# Patient Record
Sex: Male | Born: 1935 | Race: White | Hispanic: No | Marital: Married | Smoking: Former smoker
Health system: Southern US, Community
[De-identification: ages and names within clinical notes are randomized; demographics above are authoritative.]

## PROBLEM LIST (undated history)

## (undated) DIAGNOSIS — D701 Agranulocytosis secondary to cancer chemotherapy: Secondary | ICD-10-CM

## (undated) DIAGNOSIS — I10 Essential (primary) hypertension: Secondary | ICD-10-CM

## (undated) DIAGNOSIS — C3490 Malignant neoplasm of unspecified part of unspecified bronchus or lung: Secondary | ICD-10-CM

## (undated) DIAGNOSIS — C189 Malignant neoplasm of colon, unspecified: Secondary | ICD-10-CM

## (undated) DIAGNOSIS — C719 Malignant neoplasm of brain, unspecified: Secondary | ICD-10-CM

## (undated) DIAGNOSIS — I420 Dilated cardiomyopathy: Secondary | ICD-10-CM

## (undated) DIAGNOSIS — Z9221 Personal history of antineoplastic chemotherapy: Secondary | ICD-10-CM

## (undated) DIAGNOSIS — D649 Anemia, unspecified: Secondary | ICD-10-CM

## (undated) DIAGNOSIS — Z923 Personal history of irradiation: Secondary | ICD-10-CM

## (undated) DIAGNOSIS — C229 Malignant neoplasm of liver, not specified as primary or secondary: Secondary | ICD-10-CM

## (undated) DIAGNOSIS — I340 Nonrheumatic mitral (valve) insufficiency: Secondary | ICD-10-CM

## (undated) DIAGNOSIS — R918 Other nonspecific abnormal finding of lung field: Secondary | ICD-10-CM

## (undated) DIAGNOSIS — R0602 Shortness of breath: Secondary | ICD-10-CM

## (undated) DIAGNOSIS — T451X5A Adverse effect of antineoplastic and immunosuppressive drugs, initial encounter: Principal | ICD-10-CM

## (undated) HISTORY — PX: HERNIA REPAIR: SHX51

## (undated) HISTORY — PX: OTHER SURGICAL HISTORY: SHX169

## (undated) HISTORY — DX: Malignant neoplasm of brain, unspecified: C71.9

## (undated) HISTORY — DX: Agranulocytosis secondary to cancer chemotherapy: D70.1

## (undated) HISTORY — DX: Personal history of irradiation: Z92.3

## (undated) HISTORY — DX: Anemia, unspecified: D64.9

## (undated) HISTORY — DX: Malignant neoplasm of colon, unspecified: C18.9

## (undated) HISTORY — DX: Personal history of antineoplastic chemotherapy: Z92.21

## (undated) HISTORY — DX: Malignant neoplasm of unspecified part of unspecified bronchus or lung: C34.90

## (undated) HISTORY — DX: Adverse effect of antineoplastic and immunosuppressive drugs, initial encounter: T45.1X5A

## (undated) HISTORY — DX: Essential (primary) hypertension: I10

---

## 2010-12-21 ENCOUNTER — Emergency Department (HOSPITAL_COMMUNITY)
Admission: EM | Admit: 2010-12-21 | Discharge: 2010-12-21 | Disposition: A | Discharge status: Home / Self Care | Payer: Medicare Other | Attending: Emergency Medicine | Admitting: Emergency Medicine

## 2010-12-21 ENCOUNTER — Emergency Department (HOSPITAL_COMMUNITY): Payer: Medicare Other

## 2010-12-21 DIAGNOSIS — S61209A Unspecified open wound of unspecified finger without damage to nail, initial encounter: Secondary | ICD-10-CM | POA: Insufficient documentation

## 2010-12-21 DIAGNOSIS — W298XXA Contact with other powered powered hand tools and household machinery, initial encounter: Secondary | ICD-10-CM | POA: Insufficient documentation

## 2010-12-21 DIAGNOSIS — Y92009 Unspecified place in unspecified non-institutional (private) residence as the place of occurrence of the external cause: Secondary | ICD-10-CM | POA: Insufficient documentation

## 2011-01-01 ENCOUNTER — Emergency Department (HOSPITAL_COMMUNITY)
Admission: EM | Admit: 2011-01-01 | Discharge: 2011-01-01 | Disposition: A | Discharge status: Home / Self Care | Payer: Medicare Other | Attending: Emergency Medicine | Admitting: Emergency Medicine

## 2011-01-01 ENCOUNTER — Encounter: Payer: Self-pay | Admitting: *Deleted

## 2011-01-01 DIAGNOSIS — Z4802 Encounter for removal of sutures: Secondary | ICD-10-CM | POA: Insufficient documentation

## 2011-01-01 NOTE — ED Notes (Signed)
Suture removal to index finger 

## 2011-11-02 ENCOUNTER — Encounter (HOSPITAL_COMMUNITY): Payer: Self-pay

## 2011-11-02 ENCOUNTER — Emergency Department (HOSPITAL_COMMUNITY)
Admission: EM | Admit: 2011-11-02 | Discharge: 2011-11-02 | Disposition: A | Discharge status: Home / Self Care | Payer: Medicare Other | Attending: Emergency Medicine | Admitting: Emergency Medicine

## 2011-11-02 ENCOUNTER — Emergency Department (HOSPITAL_COMMUNITY): Payer: Medicare Other

## 2011-11-02 DIAGNOSIS — R0602 Shortness of breath: Secondary | ICD-10-CM | POA: Insufficient documentation

## 2011-11-02 DIAGNOSIS — D649 Anemia, unspecified: Secondary | ICD-10-CM

## 2011-11-02 DIAGNOSIS — R21 Rash and other nonspecific skin eruption: Secondary | ICD-10-CM | POA: Insufficient documentation

## 2011-11-02 DIAGNOSIS — C799 Secondary malignant neoplasm of unspecified site: Secondary | ICD-10-CM

## 2011-11-02 DIAGNOSIS — C801 Malignant (primary) neoplasm, unspecified: Secondary | ICD-10-CM | POA: Insufficient documentation

## 2011-11-02 DIAGNOSIS — R609 Edema, unspecified: Secondary | ICD-10-CM | POA: Insufficient documentation

## 2011-11-02 LAB — BASIC METABOLIC PANEL
BUN: 26 mg/dL — ABNORMAL HIGH (ref 6–23)
CO2: 25 mEq/L (ref 19–32)
Chloride: 95 mEq/L — ABNORMAL LOW (ref 96–112)
Creatinine, Ser: 1 mg/dL (ref 0.50–1.35)
Potassium: 3.9 mEq/L (ref 3.5–5.1)

## 2011-11-02 LAB — CBC
HCT: 29.9 % — ABNORMAL LOW (ref 39.0–52.0)
Hemoglobin: 9 g/dL — ABNORMAL LOW (ref 13.0–17.0)
MCV: 68.1 fL — ABNORMAL LOW (ref 78.0–100.0)
RDW: 19.2 % — ABNORMAL HIGH (ref 11.5–15.5)
WBC: 16.9 10*3/uL — ABNORMAL HIGH (ref 4.0–10.5)

## 2011-11-02 MED ORDER — IOHEXOL 300 MG/ML  SOLN
80.0000 mL | Freq: Once | INTRAMUSCULAR | Status: AC | PRN
  Administered 2011-11-02: 80 mL via INTRAVENOUS

## 2011-11-02 NOTE — ED Provider Notes (Signed)
History     CSN: [id number]  Arrival date & time 11/02/11  1058   First MD Initiated Contact with Patient 11/02/11 1110      Chief Complaint  Patient presents with  . Shortness of Breath    Patient is a 78 y.o. male presenting with shortness of breath. The history is provided by the patient.  Shortness of Breath  The current episode started more than 2 weeks ago (one month ago). The onset was gradual. The problem occurs continuously. The problem has been gradually worsening. The problem is moderate. The symptoms are relieved by rest. The symptoms are aggravated by activity. Associated symptoms include shortness of breath. Pertinent negatives include no chest pain, no fever and no cough.  Pt has also been having decreased activity and weight loss.  He has had some swelling.  Pt was seen by PCP yesterday and found to have increased WBC with nodules noted on the CT.  Pt was told to come to the hospital yesterday for further workup but pt had to do a few things first.  History reviewed. No pertinent past medical history.  Past Surgical History  Procedure Date  . Hernia repair     History reviewed. No pertinent family history.  History  Substance Use Topics  . Smoking status: Never Smoker   . Smokeless tobacco: Not on file  . Alcohol Use: No      Review of Systems  Constitutional: Negative for fever.  Respiratory: Positive for shortness of breath. Negative for cough.   Cardiovascular: Negative for chest pain.  All other systems reviewed and are negative.    Allergies  Review of patient's allergies indicates no known allergies.  Home Medications   Current Outpatient Rx  Name Route Sig Dispense Refill  . CIPROFLOXACIN HCL 500 MG PO TABS Oral Take 500 mg by mouth 2 (two) times daily. Take for 10 days.  First dose 11/01/2011.    Marland Kitchen FUROSEMIDE 20 MG PO TABS Oral Take 20 mg by mouth 2 (two) times daily.    Marland Kitchen OVER THE COUNTER MEDICATION Oral Take 1 tablet by mouth 3 (three)  times daily. "Blood Pressurex"    . OVER THE COUNTER MEDICATION Oral Take 1 tablet by mouth 2 (two) times daily. "Stress-J"      BP 124/68  Pulse 94  Temp(Src) 97.8 F (36.6 C) (Oral)  Resp 17  SpO2 100%  Physical Exam  Nursing note and vitals reviewed. Constitutional: He appears well-developed and well-nourished. No distress.  HENT:  Head: Normocephalic and atraumatic.  Right Ear: External ear normal.  Left Ear: External ear normal.  Eyes: Conjunctivae are normal. Right eye exhibits no discharge. Left eye exhibits no discharge. No scleral icterus.  Neck: Neck supple. No tracheal deviation present.  Cardiovascular: Normal rate, regular rhythm and intact distal pulses.   Pulmonary/Chest: Effort normal and breath sounds normal. No stridor. No respiratory distress. He has no wheezes. He has no rales.  Abdominal: Soft. Bowel sounds are normal. He exhibits no distension. There is no tenderness. There is no rebound and no guarding.  Musculoskeletal: He exhibits edema. He exhibits no tenderness.  Neurological: He is alert. He has normal strength. No sensory deficit. Cranial nerve deficit:  no gross defecits noted. He exhibits normal muscle tone. He displays no seizure activity. Coordination normal.  Skin: Skin is warm and dry. Rash noted.  Psychiatric: He has a normal mood and affect.    ED Course  Procedures (including critical care time)  Rate: 90  Rhythm: normal sinus rhythm  QRS Axis: normal  Intervals: normal  ST/T Wave abnormalities: Inverted T waves  Conduction Disutrbances:none  Narrative Interpretation: Short PR interval, nonspecific intraventricular conduction delay, LVH with repolarization abnormality  Old EKG Reviewed: none available  Labs Reviewed  BASIC METABOLIC PANEL - Abnormal; Notable for the following:    Sodium 133 (*)    Chloride 95 (*)    Glucose, Bld 121 (*)    BUN 26 (*)    GFR calc non Af Amer 71 (*)    GFR calc Af Amer 83 (*)    All other components  within normal limits  CBC - Abnormal; Notable for the following:    WBC 16.9 (*)    Hemoglobin 9.0 (*)    HCT 29.9 (*)    MCV 68.1 (*)    MCH 20.5 (*)    RDW 19.2 (*)    Platelets 546 (*)    All other components within normal limits   Ct Chest W Contrast  11/02/2011  *RADIOLOGY REPORT*  Clinical Data: Shortness of breath  CT CHEST WITH CONTRAST  Technique:  Multidetector CT imaging of the chest was performed following the standard protocol during bolus administration of intravenous contrast.  Contrast: 80mL OMNIPAQUE IOHEXOL 300 MG/ML  SOLN  Comparison: None.  Findings:  No enlarged axillary or supraclavicular lymph nodes.  Right paratracheal lymph node measures 1 cm, image 17.  Low right paratracheal lymph node measures 1.1 cm, image 25.   Heart size appears moderately enlarged.  Calcifications involving the LAD and left circumflex coronary arteries noted. There are small bilateral pleural effusions.  Left upper lobe pulmonary nodule measures 1.3 cm, image 30.  Within the left lower lobe there is a subpleural nodule measuring 1.6 x 2.1 cm, image 45.  Mass within the right base measures 30 x 2.9 cm, image 60.  Review of the visualized osseous structures is significant for mild thoracic spondylosis.  There are innumerable metastasis throughout both lobes of the liver.  Index lesion in the left hepatic lobe measures 3.0 x 2.2 cm, image 66.  Index lesion within the caudate lobe of the liver measures 2.5 x 2.4 cm, image 71.  Visualized portions of the pancreas appear normal.  The adrenal glands appear normal.  Cysts are noted within the upper pole of the left kidney.  IMPRESSION:  1.  Bilateral, multifocal pulmonary metastasis. 2.  Multifocal liver metastasis. 3.  Pleural effusions. 4.  Borderline enlarged mediastinal lymph nodes.  Original Report Authenticated By: Rosealee Albee, M.D.     1. Metastatic cancer   2. Anemia       MDM  I have discussed the findings with the patient and the family.   They understand the diagnosis of metastatic cancer.  They would prefer to follow up as an outpatient.  I have discussed the case with Dr Cyndie Chime.  Pt will follow up in the oncology office next week.  They will call to schedule an appointment.        Celene Kras, MD 11/02/11 1537

## 2011-11-02 NOTE — ED Notes (Signed)
Pt complains of sob for 1 month, seen at md office yesterday and found spots on lungs.

## 2011-11-02 NOTE — Discharge Instructions (Signed)
Metastatic Cancer, Questions and Answers KEY POINTS  Cancer happens when cells become abnormal and grow without control.   Where the cancer started is called the primary cancer or the primary tumor.   Metastatic cancer happens when cancer cells spread from the place where it started to other parts of the body.   When cancer spreads, the metastatic cancer keeps the same type of cells and the same name as the primary tumor.   The most common sites of metastasis are the lungs, bones, liver, and brain.   Treatment for metastatic cancer usually depends on the type of cancer. It also depends on the size and location of the metastasis.  WHAT IS CANCER?   Cancer is a group of many related diseases. All cancers begin in cells. Cells are the building blocks that make up tissues. Cancer that arises from organs and solid tissues is called a solid tumor. Cancer that begins in blood cells is called leukemia, multiple myeloma, or lymphoma.   Normally, cells grow and divide to form new cells as the body needs them. When cells grow old and die, new cells take their place. Sometimes this orderly process goes wrong. New cells form when the body does not need them. Old cells do not die when they should.   The extra cells form a mass of tissue. This is called a growth or tumor. Tumors can be either not cancerous (benign) or cancerous (malignant). Benign tumors do not spread to other parts of the body. They are rarely a threat to life. Malignant tumors can spread (metastasize) and may be life threatening.  WHAT IS PRIMARY CANCER?  Cancer can begin in any organ or tissue of the body. The original tumor is called the primary cancer or primary tumor. It is usually named for the part of the body or the type of cell in which it begins. WHAT IS METASTASIS, AND HOW DOES IT HAPPEN?   Metastasis means the spread of cancer. Cancer cells can break away from a primary tumor and enter the bloodstream or lymphatic system. This  is the system that produces, stores, and carries the cells that fight infections. That is how cancer cells spread to other parts of the body.   When cancer cells spread and form a new tumor in a different organ, the new tumor is a metastatic tumor. The cells in the metastatic tumor come from the original tumor. For example, if breast cancer spreads to the lungs, the metastatic tumor in the lung is made up of cancerous breast cells. It is not made of lung cells. In this case, the disease in the lungs is metastatic breast cancer (not lung cancer). Under a microscope, metastatic breast cancer cells generally look the same as the cancer cells in the breast.  Banks?   Cancer cells can spread to almost any part of the body. Cancer cells frequently spread to lymph nodes (rounded masses of lymphatic tissue) near the primary tumor (regional lymph nodes). This is called lymph node involvement or regional disease. Cancer that spreads to other organs or to lymph nodes far from the primary tumor is called metastatic disease. Caregivers sometimes also call this distant disease.   The most common sites of metastasis from solid tumors are the lungs, bones, liver, and brain. Some cancers tend to spread to certain parts of the body. For example, lung cancer often metastasizes to the brain or bones. Colon cancer often spreads to the liver. Prostate cancer tends to spread  to the bones. Breast cancer commonly spreads to the bones, lungs, liver, or brain. But each of these cancers can spread to other parts of the body as well.   Because blood cells travel throughout the body, leukemia, multiple myeloma, and lymphoma cells are usually not localized when the cancer is diagnosed. Tumor cells may be found in the blood, several lymph nodes, or other parts of the body such as the liver or bones. This type of spread is not referred to as metastasis.  ARE THERE SYMPTOMS OF METASTATIC CANCER?   Some people with  metastatic cancer do not have symptoms. Their metastases are found by X-rays and other tests performed for other reasons.   When symptoms of metastatic cancer occur, the type and frequency of the symptoms will depend on the size and location of the metastasis. For example, cancer that spreads to the bones is likely to cause pain and can lead to bone fractures. Cancer that spreads to the brain can cause a variety of symptoms. These include headaches, seizures, and unsteadiness. Shortness of breath may be a sign of lung involvement. Abdominal swelling or yellowing of the skin (jaundice) can indicate that cancer has spread to the liver.   Sometimes a person's primary cancer is discovered only after the metastatic tumor causes symptoms. For example, a man whose prostate cancer has spread to the bones in his pelvis may have lower back pain (caused by the cancer in his bones) before he experiences any symptoms from the primary tumor in his prostate.  HOW DOES THE CAREGIVER KNOW WHETHER A CANCER IS PRIMARY OR A METASTATIC TUMOR?  To determine whether a tumor is primary or metastatic, the tumor will be examined under a microscope. In general, cancer cells look like abnormal versions of cells in the tissue where the cancer began. Using specialized diagnostic tests, a trained person is often able to tell where the cancer cells came from. Markers or antigens found in or on the cancer cells can indicate the primary site of the cancer.   Metastatic cancers may be found before or at the same time as the primary tumor, or months or years later. When a new tumor is found in a patient who has been treated for cancer in the past, it is more often a metastasis than another primary tumor.  IS IT POSSIBLE TO HAVE A METASTATIC TUMOR WITHOUT HAVING A PRIMARY CANCER?  No. A metastatic tumor always starts from cancer cells in another part of the body. In most cases, when a metastatic tumor is found first, the primary tumor can be  found. The search for the primary tumor may involve lab tests, X-rays, and other procedures. However, in a small number of cases, a metastatic tumor is diagnosed but the primary tumor cannot be found, in spite of extensive tests. The tumor is metastatic because the cells are not like those in the organ or tissue in which the tumor is found. The primary tumor is called unknown or hidden (occult). The patient is said to have cancer of unknown primary origin (CUP). Because diagnostic techniques are constantly improving, the number of cases of CUP is going down.  WHAT TREATMENTS ARE USED FOR METASTATIC CANCER?   When cancer has metastasized, it may be treated with:   Chemotherapy.   Radiation therapy.   Biological therapy.   Hormone therapy.   Surgery.   Cryosurgery.   A combination of these.   The choice of treatment generally depends on the:   Type  of primary cancer.   Size and location of the metastasis.   Patient's age and general health.   Types of treatments the patient has had in the past.  In patients with CUP, it is possible to treat the disease even though the primary tumor has not been located. The goal of treatment may be to control the cancer, or to relieve symptoms or side effects of treatment. ARE NEW TREATMENTS FOR METASTATIC CANCER BEING DEVELOPED?  Yes, many new cancer treatments are under study. To develop new treatments, the NCI sponsors clinical trials (research studies) with cancer patients in many hospitals, universities, medical schools, and cancer centers around the country. Clinical trials are a critical step in the improvement of treatment. Before any new treatment can be recommended for general use, doctors conduct studies to find out whether the treatment is both safe for patients and effective against the disease. The results of such studies have led to progress not only in the treatment of cancer, but in the detection, diagnosis, and prevention of the disease  as well. Patients interested in taking part in a clinical trial should talk with their caregivers. FOR MORE INFORMATION National Cancer Institute (NCI): www.cancer.gov Document Released: 10/09/2004 Document Revised: 05/24/2011 Document Reviewed: 05/27/2008 [facility] Patient Information 2012 [place], [place].

## 2011-11-05 ENCOUNTER — Telehealth: Payer: Self-pay | Admitting: *Deleted

## 2011-11-05 NOTE — Telephone Encounter (Signed)
Spoke with pt wife regarding appt time and place.  mtoc 11/08/11 at 3:00.  She verbalized understanding off appt time and place.

## 2011-11-08 ENCOUNTER — Encounter: Payer: Self-pay | Admitting: *Deleted

## 2011-11-08 ENCOUNTER — Ambulatory Visit (HOSPITAL_BASED_OUTPATIENT_CLINIC_OR_DEPARTMENT_OTHER): Payer: Medicare Other | Admitting: Internal Medicine

## 2011-11-08 ENCOUNTER — Encounter: Payer: Self-pay | Admitting: Internal Medicine

## 2011-11-08 VITALS — BP 135/76 | HR 100 | Temp 97.6°F | Resp 20 | Ht 71.0 in | Wt 161.0 lb

## 2011-11-08 DIAGNOSIS — C801 Malignant (primary) neoplasm, unspecified: Secondary | ICD-10-CM

## 2011-11-08 DIAGNOSIS — R160 Hepatomegaly, not elsewhere classified: Secondary | ICD-10-CM

## 2011-11-08 DIAGNOSIS — C787 Secondary malignant neoplasm of liver and intrahepatic bile duct: Secondary | ICD-10-CM

## 2011-11-08 DIAGNOSIS — C7800 Secondary malignant neoplasm of unspecified lung: Secondary | ICD-10-CM

## 2011-11-08 DIAGNOSIS — R630 Anorexia: Secondary | ICD-10-CM

## 2011-11-08 DIAGNOSIS — R911 Solitary pulmonary nodule: Secondary | ICD-10-CM

## 2011-11-08 NOTE — Progress Notes (Signed)
[facility] [phone]   Fax:([phone]  CONSULT NOTE  REASON FOR CONSULTATION:  78 years old white male with metastatic liver lesions.  HPI Joseph Rowland is a 78 y.o. male was past medical history significant for hypertension and remote history of smoking. The patient has been complaining of shortness of breath and weakness for a couple of months. He was seen by his primary care physician at [facility]. He had chest x-ray performed which showed bilateral pulmonary nodules. This was followed by CT scan of the chest on 11/02/2011 and it showed innumerable metastasis throughout both lobes of the liver. Index lesion in the left hepatic lobe measures 3.0 x 2.2 cm, image 66. Index lesion within the caudate lobe of the liver measures 2.5 x 2.4 cm. There was also right paratracheal lymph node measures 1 cm. Low right paratracheal lymph node measures 1.1 cm. There was also Left upper lobe pulmonary nodule measures 1.3 cm, image 30. Within the left lower lobe there is a subpleural nodule measuring 1.6 x 2.1 cm. Mass within the right base measures 30 x 2.9 cm. the patient was referred to me today for evaluation and recommendations regarding these findings. The patient has weight loss of around 10 pounds in the last month as well as lack of appetite. He is currently drinks nutritional supplements. He has no change in his bowel movement. He never had a colonoscopy performed. But denied having any rectal bleeding or black tarry stool.  The patient is married and has 2 sons and daughter he was accompanied today by his wife [name], daughter [name] and daughter-in-law, [name].  The patient has a remote history of smoking and quit 32 years ago.    @SFHPI @  No past medical history on file.  Past Surgical History  Procedure Date  . Hernia repair     No family history on file.  Social History History  Substance Use Topics  . Smoking status: Former  Smoker    Types: Cigarettes    Quit date: 06/18/1978  . Smokeless tobacco: Never Used  . Alcohol Use: No    No Known Allergies  Current Outpatient Prescriptions  Medication Sig Dispense Refill  . ciprofloxacin (CIPRO) 500 MG tablet Take 500 mg by mouth 2 (two) times daily. Take for 10 days.  First dose 11/01/2011.      Marland Kitchen OVER THE COUNTER MEDICATION Take 1 tablet by mouth 3 (three) times daily. "Blood Pressurex"      . OVER THE COUNTER MEDICATION Take 1 tablet by mouth 2 (two) times daily. "Stress-J"        Review of Systems  A comprehensive review of systems was negative except for: Constitutional: positive for anorexia, fatigue and weight loss Respiratory: positive for dyspnea on exertion Musculoskeletal: positive for muscle weakness  Physical Exam  AVW:UJWJX, healthy, no distress, well nourished and well developed SKIN: skin color, texture, turgor are normal HEAD: Normocephalic, No masses, lesions, tenderness or abnormalities EYES: normal EARS: External ears normal OROPHARYNX:no exudate and no erythema  NECK: supple, no adenopathy LYMPH:  no palpable lymphadenopathy, no hepatosplenomegaly LUNGS: clear to auscultation  HEART: regular rate & rhythm, no murmurs and no gallops ABDOMEN:abdomen soft, non-tender, normal bowel sounds and no masses or organomegaly BACK: Back symmetric, no curvature. EXTREMITIES:no joint deformities, effusion, or inflammation, no edema, no skin discoloration, no clubbing, no cyanosis  NEURO: alert & oriented x 3 with fluent speech, no focal motor/sensory deficits  PERFORMANCE STATUS: ECOG  Studies/Results: Ct Chest W Contrast  11/02/2011  *RADIOLOGY REPORT*  Clinical Data: Shortness of breath  CT CHEST WITH CONTRAST  Technique:  Multidetector CT imaging of the chest was performed following the standard protocol during bolus administration of intravenous contrast.  Contrast: 80mL OMNIPAQUE IOHEXOL 300 MG/ML  SOLN  Comparison: None.  Findings:  No  enlarged axillary or supraclavicular lymph nodes.  Right paratracheal lymph node measures 1 cm, image 17.  Low right paratracheal lymph node measures 1.1 cm, image 25.   Heart size appears moderately enlarged.  Calcifications involving the LAD and left circumflex coronary arteries noted. There are small bilateral pleural effusions.  Left upper lobe pulmonary nodule measures 1.3 cm, image 30.  Within the left lower lobe there is a subpleural nodule measuring 1.6 x 2.1 cm, image 45.  Mass within the right base measures 30 x 2.9 cm, image 60.  Review of the visualized osseous structures is significant for mild thoracic spondylosis.  There are innumerable metastasis throughout both lobes of the liver.  Index lesion in the left hepatic lobe measures 3.0 x 2.2 cm, image 66.  Index lesion within the caudate lobe of the liver measures 2.5 x 2.4 cm, image 71.  Visualized portions of the pancreas appear normal.  The adrenal glands appear normal.  Cysts are noted within the upper pole of the left kidney.  IMPRESSION:  1.  Bilateral, multifocal pulmonary metastasis. 2.  Multifocal liver metastasis. 3.  Pleural effusions. 4.  Borderline enlarged mediastinal lymph nodes.  Original Report Authenticated By: Rosealee Albee, M.D.     ASSESSMENT: This is a very pleasant 78 years old white male was metastatic lesions in the liver and lung suspicious to be secondary to metastatic colon adenocarcinoma.  PLAN: I have a lengthy discussion with the patient and his family about his current condition and that showed them the images of his scan. 1) I would complete his staging workup by ordering a PET scan as well as MRI of the brain. 2) I would referred the patient went to radiology for consideration of ultrasound guided fine needle aspiration and core biopsy of  a liver lesion. 3) as the biopsy is consistent with metastatic colon adenocarcinoma I would consider referring the patient to gastroenterology for consideration of  colonoscopy to rule out any obstruction. 4) I would see the patient back for followup visit in less than 2 weeks for evaluation and discussion of his biopsy and scan results. The patient was advised to call me immediately if he has any concerning symptoms in the interval.  All questions were answered. The patient knows to call the clinic with any problems, questions or concerns. We can certainly see the patient much sooner if necessary.  Thank you so much for allowing me to participate in the care of [facility]. I will continue to follow up the patient with you and assist in his care.  I spent 25 minutes counseling the patient face to face. The total time spent in the appointment was 50 minutes.   Yussuf Sawyers K. 11/08/2011, 4:18 PM

## 2011-11-08 NOTE — Progress Notes (Signed)
Spoke with pt and family at [facility] 11/08/11.  Questions and concerns answered

## 2011-11-09 ENCOUNTER — Telehealth: Payer: Self-pay | Admitting: Internal Medicine

## 2011-11-09 ENCOUNTER — Telehealth: Payer: Self-pay | Admitting: *Deleted

## 2011-11-09 ENCOUNTER — Encounter: Payer: Self-pay | Admitting: *Deleted

## 2011-11-09 NOTE — Telephone Encounter (Signed)
TONI SPOKE TO PT.'S WIFE AND INSTRUCTED PT. TO BE AT THE HOSPITAL AT 11:30AM. THIS NOTE TO DR.MOHAMED'S NURSE'S DESK.

## 2011-11-09 NOTE — Telephone Encounter (Signed)
l/m for pt to call for appts   aom

## 2011-11-09 NOTE — Telephone Encounter (Signed)
wife aware of mri,pet and ret appts  aom

## 2011-11-13 ENCOUNTER — Other Ambulatory Visit: Payer: Self-pay | Admitting: Physician Assistant

## 2011-11-14 ENCOUNTER — Encounter (HOSPITAL_COMMUNITY): Payer: Self-pay

## 2011-11-14 ENCOUNTER — Other Ambulatory Visit: Payer: Self-pay | Admitting: Internal Medicine

## 2011-11-14 ENCOUNTER — Ambulatory Visit (HOSPITAL_COMMUNITY)
Admission: RE | Admit: 2011-11-14 | Discharge: 2011-11-14 | Disposition: A | Discharge status: Home / Self Care | Payer: Medicare Other | Source: Ambulatory Visit | Attending: Internal Medicine | Admitting: Internal Medicine

## 2011-11-14 DIAGNOSIS — R911 Solitary pulmonary nodule: Secondary | ICD-10-CM

## 2011-11-14 DIAGNOSIS — C787 Secondary malignant neoplasm of liver and intrahepatic bile duct: Secondary | ICD-10-CM | POA: Insufficient documentation

## 2011-11-14 DIAGNOSIS — C189 Malignant neoplasm of colon, unspecified: Secondary | ICD-10-CM

## 2011-11-14 DIAGNOSIS — R160 Hepatomegaly, not elsewhere classified: Secondary | ICD-10-CM

## 2011-11-14 DIAGNOSIS — C801 Malignant (primary) neoplasm, unspecified: Secondary | ICD-10-CM | POA: Insufficient documentation

## 2011-11-14 HISTORY — DX: Malignant neoplasm of colon, unspecified: C18.9

## 2011-11-14 LAB — PROTIME-INR: INR: 1.1 (ref 0.00–1.49)

## 2011-11-14 LAB — CBC
HCT: 30.1 % — ABNORMAL LOW (ref 39.0–52.0)
Hemoglobin: 8.7 g/dL — ABNORMAL LOW (ref 13.0–17.0)
MCHC: 28.9 g/dL — ABNORMAL LOW (ref 30.0–36.0)

## 2011-11-14 MED ORDER — FENTANYL CITRATE 0.05 MG/ML IJ SOLN
INTRAMUSCULAR | Status: AC | PRN
  Administered 2011-11-14: 100 ug via INTRAVENOUS

## 2011-11-14 MED ORDER — FENTANYL CITRATE 0.05 MG/ML IJ SOLN
INTRAMUSCULAR | Status: AC
  Filled 2011-11-14: qty 2

## 2011-11-14 MED ORDER — MIDAZOLAM HCL 2 MG/2ML IJ SOLN
INTRAMUSCULAR | Status: AC
  Filled 2011-11-14: qty 4

## 2011-11-14 MED ORDER — MIDAZOLAM HCL 5 MG/5ML IJ SOLN
INTRAMUSCULAR | Status: AC | PRN
  Administered 2011-11-14: 2 mg via INTRAVENOUS

## 2011-11-14 MED ORDER — POTASSIUM CHLORIDE IN NACL 20-0.9 MEQ/L-% IV SOLN
INTRAVENOUS | Status: DC
  Administered 2011-11-14: 12:00:00 via INTRAVENOUS
  Filled 2011-11-14: qty 1000

## 2011-11-14 NOTE — Procedures (Signed)
US liver lesion core bx 18g x3 No complication No blood loss. See complete dictation in [facility] PACS.  

## 2011-11-14 NOTE — Discharge Instructions (Signed)
Liver Biopsy A liver biopsy is done to confirm or prove a suspected problem. The liver is a large organ in the upper right hand of your abdomen. To do the test, the doctor puts a small needle into the right side of your abdomen. A tiny piece of liver tissue is taken and sent for testing. This should not be painful as the skin is injected with a local anesthetic that numbs the area.  HOW A BIOPSY IS PERFORMED This is often performed as a same day surgery. This can be done in a hospital or clinic. Biopsies are often done under local anesthesia which makes the area of biopsy numb. Sometimes sedation is given to help patients relax. If you are taking blood thinning medications or medications containing aspirin, this must be discussed with your caregiver before the test. This medication may need to be stopped for up to 7 days before the procedure, or the dose may need to be changed. You should review all of your other medications with your caregiver before the test. You must remain in bed for 1 to 2 hours after the test. Having something to read may help pass the time.  LET YOUR CAREGIVERS KNOW ABOUT THE FOLLOWING:  Allergies.   Medications taken including herbs, eye drops, over -the- counter medications, and creams.   Use of steroids (by mouth or creams).   Previous problems with anesthetics or novocaine.   Possibility of pregnancy, if this applies.   History of blood clots (thrombophlebitis).   History of bleeding or blood problems.   Previous surgery.   Other health problems.  BEFORE THE PROCEDURE You should be present 60 minutes prior to your procedure or as directed. Check in at the admissions desk for filling out necessary forms if not pre-registered. There will be consent forms to sign prior to the procedure. There is a waiting area for your family while you are having your biopsy. AFTER THE PROCEDURE  After your biopsy, you will be taken to the recovery area where a nurse will watch  and check your progress.   You may have to lie on your right side for 1 to 2 hours.   Your blood pressure and pulse will be checked often.   If you are having pain or feel sick, tell your nurse.   After 1 to 2 hours, if you are going home, you may sit in a chair and get dressed. The nurse will let you know when you can get up.   Once you are doing well, barring other problems, you will be allowed to go home. Once at home, putting an ice pack on your operative site may help with discomfort and keep swelling down.   You may resume a normal diet and activities as directed.   Change dressings as directed.   Only take over-the-counter or prescription medicines for pain, discomfort, or fever as directed by your caregiver.   Call for your results as instructed by your caregiver. Remember it is your job to be sure you get the results of your biopsy and any additional tests performed on the sample taken. Do not assume everything is fine if you do not hear from your caregiver.  HOME CARE INSTRUCTIONS   You should rest for one to two days or as instructed.   You will need to have a responsible adult take you home and stay with you overnight.   Do not lift over 5 lbs. or play contact sports for two weeks.     Do not drive for 24 hours.   Do not take medication containing aspirin or drink alcohol for one week after this test.  SEEK MEDICAL CARE IF:   There is increased bleeding (more than a small spot) from the biopsy site.   You have redness, swelling, or increasing pain in the biopsy site.   You develop swelling or pain in the abdomen.   You have an unexplained oral temperature over 102 F (38.9 C).   You notice a foul smell coming from the wound or dressing.  SEEK IMMEDIATE MEDICAL CARE IF:   You develop a rash.   You have difficulty breathing.   You have allergic problems such as itching or swelling or shortness of breath.  Document Released: 08/25/2003 Document Revised:  05/24/2011 Document Reviewed: 01/13/2008 Exit[employer] Patient Information 2012 [employer]. 

## 2011-11-14 NOTE — H&P (Signed)
Joseph Rowland is an 78 y.o. male.   Chief Complaint: wt loss; short of breath; CT shows liver lesions Scheduled for biopsy of liver lesion HPI: abn liver on CT  History reviewed. No pertinent past medical history.  Past Surgical History  Procedure Date  . Hernia repair     History reviewed. No pertinent family history. Social History:  reports that he quit smoking about 33 years ago. His smoking use included Cigarettes. He has never used smokeless tobacco. He reports that he does not drink alcohol or use illicit drugs.  Allergies: No Known Allergies   (Not in a hospital admission)  Results for orders placed during the hospital encounter of 11/14/11 (from the past 48 hour(s))  CBC     Status: Abnormal   Collection Time   11/14/11 12:00 PM      Component Value Range Comment   WBC 15.4 (*) 4.0 - 10.5 (K/uL)    RBC 4.28  4.22 - 5.81 (MIL/uL)    Hemoglobin 8.7 (*) 13.0 - 17.0 (g/dL)    HCT 40.9 (*) 81.1 - 52.0 (%)    MCV 70.3 (*) 78.0 - 100.0 (fL)    MCH 20.3 (*) 26.0 - 34.0 (pg)    MCHC 28.9 (*) 30.0 - 36.0 (g/dL)    RDW 91.4 (*) 78.2 - 15.5 (%)    Platelets 569 (*) 150 - 400 (K/uL)   PROTIME-INR     Status: Normal   Collection Time   11/14/11 12:00 PM      Component Value Range Comment   Prothrombin Time 14.4  11.6 - 15.2 (seconds)    INR 1.10  0.00 - 1.49     No results found.  Review of Systems  Constitutional: Positive for weight loss. Negative for fever.  Respiratory: Positive for shortness of breath.   Cardiovascular: Negative for chest pain.  Gastrointestinal: Negative for nausea, vomiting and abdominal pain.  Neurological: Negative for dizziness and headaches.    Blood pressure 132/71, pulse 91, temperature 97.5 F (36.4 C), temperature source Oral, resp. rate 18, height 5\' 11"  (1.803 m), weight 162 lb (73.483 kg), SpO2 100.00%. Physical Exam  Constitutional: He is oriented to person, place, and time. He appears well-developed and well-nourished.    Cardiovascular: Normal rate, regular rhythm and normal heart sounds.   No murmur heard. Respiratory: Effort normal and breath sounds normal. He has no wheezes.  GI: Soft. Bowel sounds are normal. There is no tenderness.  Musculoskeletal: Normal range of motion.  Neurological: He is alert and oriented to person, place, and time.  Skin: Skin is warm and dry.  Psychiatric: He has a normal mood and affect. His behavior is normal. Judgment and thought content normal.     Assessment/Plan Wt loss;  SOB; abn CT shows liver lesions Scheduled now for liver lesion bx Pt and family aware of procedure benefits and risks and agreeable to proceed. Consent signed.  Edith Lord A 11/14/2011, 12:47 PM

## 2011-11-16 ENCOUNTER — Telehealth: Payer: Self-pay | Admitting: *Deleted

## 2011-11-16 NOTE — Telephone Encounter (Signed)
Wife called Dr. Asa Lente nurse regarding concerns of medications.  We spoke and relayed information to Dr. Arbutus Ped.  Dr. Arbutus Ped stated issues need to be addressed with PCP.  I called and spoke with pt wife and relayed information.  She stated she will call PCP

## 2011-11-21 ENCOUNTER — Encounter (HOSPITAL_COMMUNITY)
Admission: RE | Admit: 2011-11-21 | Discharge: 2011-11-21 | Disposition: A | Discharge status: Home / Self Care | Payer: Medicare Other | Source: Ambulatory Visit | Attending: Internal Medicine | Admitting: Internal Medicine

## 2011-11-21 ENCOUNTER — Encounter (HOSPITAL_COMMUNITY): Payer: Self-pay

## 2011-11-21 ENCOUNTER — Ambulatory Visit (HOSPITAL_COMMUNITY)
Admission: RE | Admit: 2011-11-21 | Discharge: 2011-11-21 | Disposition: A | Discharge status: Home / Self Care | Payer: Medicare Other | Source: Ambulatory Visit | Attending: Internal Medicine | Admitting: Internal Medicine

## 2011-11-21 ENCOUNTER — Other Ambulatory Visit (HOSPITAL_COMMUNITY): Payer: Self-pay | Admitting: Radiology

## 2011-11-21 DIAGNOSIS — J984 Other disorders of lung: Secondary | ICD-10-CM | POA: Insufficient documentation

## 2011-11-21 DIAGNOSIS — C801 Malignant (primary) neoplasm, unspecified: Secondary | ICD-10-CM | POA: Insufficient documentation

## 2011-11-21 DIAGNOSIS — K639 Disease of intestine, unspecified: Secondary | ICD-10-CM | POA: Insufficient documentation

## 2011-11-21 DIAGNOSIS — R220 Localized swelling, mass and lump, head: Secondary | ICD-10-CM | POA: Insufficient documentation

## 2011-11-21 DIAGNOSIS — R221 Localized swelling, mass and lump, neck: Secondary | ICD-10-CM | POA: Insufficient documentation

## 2011-11-21 DIAGNOSIS — R160 Hepatomegaly, not elsewhere classified: Secondary | ICD-10-CM

## 2011-11-21 DIAGNOSIS — R911 Solitary pulmonary nodule: Secondary | ICD-10-CM

## 2011-11-21 DIAGNOSIS — N281 Cyst of kidney, acquired: Secondary | ICD-10-CM | POA: Insufficient documentation

## 2011-11-21 DIAGNOSIS — J90 Pleural effusion, not elsewhere classified: Secondary | ICD-10-CM | POA: Insufficient documentation

## 2011-11-21 DIAGNOSIS — G319 Degenerative disease of nervous system, unspecified: Secondary | ICD-10-CM | POA: Insufficient documentation

## 2011-11-21 DIAGNOSIS — C7800 Secondary malignant neoplasm of unspecified lung: Secondary | ICD-10-CM | POA: Insufficient documentation

## 2011-11-21 DIAGNOSIS — C787 Secondary malignant neoplasm of liver and intrahepatic bile duct: Secondary | ICD-10-CM | POA: Insufficient documentation

## 2011-11-21 HISTORY — DX: Malignant neoplasm of liver, not specified as primary or secondary: C22.9

## 2011-11-21 HISTORY — DX: Other nonspecific abnormal finding of lung field: R91.8

## 2011-11-21 MED ORDER — GADOBENATE DIMEGLUMINE 529 MG/ML IV SOLN
15.0000 mL | Freq: Once | INTRAVENOUS | Status: AC | PRN
  Administered 2011-11-21: 15 mL via INTRAVENOUS

## 2011-11-21 MED ORDER — FLUDEOXYGLUCOSE F - 18 (FDG) INJECTION
16.1000 | Freq: Once | INTRAVENOUS | Status: AC | PRN
  Administered 2011-11-21: 16.1 via INTRAVENOUS

## 2011-11-22 ENCOUNTER — Ambulatory Visit (HOSPITAL_BASED_OUTPATIENT_CLINIC_OR_DEPARTMENT_OTHER): Payer: Medicare Other | Admitting: Internal Medicine

## 2011-11-22 ENCOUNTER — Encounter: Payer: Self-pay | Admitting: *Deleted

## 2011-11-22 ENCOUNTER — Other Ambulatory Visit (HOSPITAL_BASED_OUTPATIENT_CLINIC_OR_DEPARTMENT_OTHER): Payer: Medicare Other | Admitting: Lab

## 2011-11-22 ENCOUNTER — Telehealth: Payer: Self-pay | Admitting: *Deleted

## 2011-11-22 VITALS — BP 122/72 | HR 101 | Temp 97.0°F | Ht 71.0 in | Wt 159.2 lb

## 2011-11-22 DIAGNOSIS — D649 Anemia, unspecified: Secondary | ICD-10-CM

## 2011-11-22 DIAGNOSIS — R911 Solitary pulmonary nodule: Secondary | ICD-10-CM

## 2011-11-22 DIAGNOSIS — C3490 Malignant neoplasm of unspecified part of unspecified bronchus or lung: Secondary | ICD-10-CM

## 2011-11-22 DIAGNOSIS — C787 Secondary malignant neoplasm of liver and intrahepatic bile duct: Secondary | ICD-10-CM

## 2011-11-22 DIAGNOSIS — C7931 Secondary malignant neoplasm of brain: Secondary | ICD-10-CM

## 2011-11-22 DIAGNOSIS — R160 Hepatomegaly, not elsewhere classified: Secondary | ICD-10-CM

## 2011-11-22 DIAGNOSIS — C189 Malignant neoplasm of colon, unspecified: Secondary | ICD-10-CM

## 2011-11-22 DIAGNOSIS — C801 Malignant (primary) neoplasm, unspecified: Secondary | ICD-10-CM

## 2011-11-22 LAB — COMPREHENSIVE METABOLIC PANEL
Albumin: 2.5 g/dL — ABNORMAL LOW (ref 3.5–5.2)
CO2: 27 mEq/L (ref 19–32)
Calcium: 8.7 mg/dL (ref 8.4–10.5)
Chloride: 102 mEq/L (ref 96–112)
Glucose, Bld: 134 mg/dL — ABNORMAL HIGH (ref 70–99)
Potassium: 4.7 mEq/L (ref 3.5–5.3)
Sodium: 136 mEq/L (ref 135–145)
Total Protein: 5.8 g/dL — ABNORMAL LOW (ref 6.0–8.3)

## 2011-11-22 LAB — CBC WITH DIFFERENTIAL/PLATELET
Basophils Absolute: 0 10*3/uL (ref 0.0–0.1)
Eosinophils Absolute: 0 10*3/uL (ref 0.0–0.5)
HGB: 8.6 g/dL — ABNORMAL LOW (ref 13.0–17.1)
MCV: 69 fL — ABNORMAL LOW (ref 79.3–98.0)
MONO#: 1.5 10*3/uL — ABNORMAL HIGH (ref 0.1–0.9)
MONO%: 9.4 % (ref 0.0–14.0)
NEUT#: 14 10*3/uL — ABNORMAL HIGH (ref 1.5–6.5)
RDW: 22.4 % — ABNORMAL HIGH (ref 11.0–14.6)
lymph#: 0.5 10*3/uL — ABNORMAL LOW (ref 0.9–3.3)

## 2011-11-22 LAB — LACTATE DEHYDROGENASE: LDH: 191 U/L (ref 94–250)

## 2011-11-22 LAB — CEA: CEA: 3.2 ng/mL (ref 0.0–5.0)

## 2011-11-22 MED ORDER — INTEGRA PLUS PO CAPS: 1.0000 | ORAL_CAPSULE | Freq: Every morning | ORAL | Status: DC

## 2011-11-22 MED ORDER — PROCHLORPERAZINE MALEATE 10 MG PO TABS: 10.0000 mg | ORAL_TABLET | Freq: Four times a day (QID) | ORAL | Status: DC | PRN

## 2011-11-22 MED ORDER — ZOLPIDEM TARTRATE 10 MG PO TABS: 10.0000 mg | ORAL_TABLET | Freq: Every evening | ORAL | Status: DC | PRN

## 2011-11-22 NOTE — Progress Notes (Signed)
Spoke with pt and family at [facility] today.  Questions and concerns addressed 

## 2011-11-22 NOTE — Telephone Encounter (Signed)
Per staff message from Eunice, I have scheduled treatment appts.  JMW  

## 2011-11-22 NOTE — Progress Notes (Signed)
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 male returns to the clinic today for followup visit accompanied several family members including his wife, daughter, son and daughter-in-law. The patient is feeling fine today with no specific complaints except for fatigue and occasional dizzy spells. He denied having any significant chest pain or shortness of breath, no cough or hemoptysis. He lost few pounds since his last visit but no significant night sweats. He has no evidence for rectal bleeding or black tarry stool. He underwent several studies since his initial evaluation, including ultrasound-guided liver fine needle aspiration and core biopsy in addition to a PET scan and MRI of the brain. The patient is here today for evaluation and discussion of his biopsy and scan results.  MEDICAL HISTORY: Past Medical History  Diagnosis Date  . Lung mass   . Liver cancer     ALLERGIES:   has no known allergies.  MEDICATIONS:  Current Outpatient Prescriptions  Medication Sig Dispense Refill  . albuterol (PROVENTIL) (5 MG/ML) 0.5% nebulizer solution Take 2.5 mg by nebulization every 6 (six) hours as needed.      Marland Kitchen aspirin 81 MG tablet Take 81 mg by mouth daily.      . Biotin 300 MCG TABS Take 300 mcg by mouth daily.      . calcium carbonate 200 MG capsule Take 250 mg by mouth daily.      . cholecalciferol (VITAMIN D) 1000 UNITS tablet Take 1,000 Units by mouth 2 (two) times daily.      . Chromium 200 MCG CAPS Take 120 mcg by mouth daily.      . ciprofloxacin (CIPRO) 500 MG tablet Take 500 mg by mouth 2 (two) times daily. Take for  10 days.  First dose 11/01/2011.      Marland Kitchen Copper Gluconate (COPPER CAPS PO) Take 2 mg by mouth daily.      Marland Kitchen FeFum-FePoly-FA-B Cmp-C-Biot (INTEGRA PLUS) CAPS Take 1 capsule by mouth every morning.  30 capsule  2  . ferrous sulfate 325 (65 FE) MG tablet Take 325 mg by mouth 3 (three) times daily.      . IODINE, KELP, PO Take 150 mg by mouth daily.      . Magnesium 100 MG CAPS Take 100 mg by mouth daily.      Marland Kitchen MANGANESE PO Take 2 mg by mouth daily.      . Multiple Vitamin (MULITIVITAMIN) LIQD Take 10 mLs by mouth daily.      . niacin 50 MG tablet Take 50 mg by mouth daily.      Marland Kitchen OVER THE COUNTER MEDICATION Take 1 tablet by mouth 3 (three) times daily. "Blood Pressurex"      . OVER THE COUNTER MEDICATION Take 1 tablet by mouth 2 (two) times daily. "Stress-J"      . potassium chloride (K-DUR) 10 MEQ tablet Take 10 mEq by mouth daily.      Marland Kitchen PROBIOTIC CAPS Take 6 capsules by mouth daily.      . prochlorperazine (COMPAZINE) 10 MG tablet Take 1 tablet (10 mg total) by mouth every 6 (six) hours as needed.  60 tablet  0  . pyridOXINE (VITAMIN B-6) 50 MG tablet Take 50 mg by mouth daily.      . Riboflavin (VITAMIN B-2 PO) Take 50 mg by mouth daily.      Marland Kitchen selenium 50 MCG TABS Take 100 mcg by mouth daily.      Marland Kitchen thiamine (VITAMIN B-1) 50 MG tablet Take 50 mg by mouth daily.      Marland Kitchen UNABLE TO FIND 50 mg. Pantothenic      . vitamin A 32440 UNIT capsule Take 10,000 Units by mouth daily.      . vitamin B-12 (CYANOCOBALAMIN) 500 MCG tablet Take 2,500 mcg by mouth daily.      . vitamin E 100 UNIT capsule Take 100 Units by mouth daily.      . vitamin k 100 MCG tablet Take 80 mcg by mouth daily.      . Zinc Sulfate (ZINC 15 PO) Take 10 mg by mouth daily.      Marland Kitchen zolpidem (AMBIEN) 10 MG tablet Take 1 tablet (10 mg total) by mouth at bedtime as needed for sleep.  30 tablet  0    SURGICAL HISTORY:  Past Surgical History  Procedure Date  . Hernia repair     REVIEW OF SYSTEMS:  A comprehensive review of  systems was negative except for: Constitutional: positive for fatigue and weight loss   PHYSICAL EXAMINATION: General appearance: alert and cooperative Head: Normocephalic, without obvious abnormality, atraumatic Neck: no adenopathy Lymph nodes: Cervical, supraclavicular, and axillary nodes normal. Resp: clear to auscultation bilaterally Back: symmetric, no curvature. ROM normal. No CVA tenderness. Cardio: regular rate and rhythm, S1, S2 normal, no murmur, click, rub or gallop GI: soft, non-tender; bowel sounds normal; no masses,  no organomegaly Extremities: extremities normal, atraumatic, no cyanosis or edema Neurologic: Alert and oriented X 3, normal strength and tone. Normal symmetric reflexes. Normal coordination and gait  ECOG PERFORMANCE STATUS: 1 - Symptomatic but completely ambulatory  Blood pressure 122/72, pulse 101, temperature 97 F (36.1 C), temperature source Oral, height 5\' 11"  (1.803 m), weight 159 lb 3.2 oz (72.213 kg).  LABORATORY DATA: Lab Results  Component Value Date   WBC 16.1* 11/22/2011   HGB 8.6* 11/22/2011   HCT 28.7* 11/22/2011   MCV 69.0* 11/22/2011   PLT 594* 11/22/2011      Chemistry      Component Value Date/Time   NA 133* 11/02/2011 1145   K 3.9 11/02/2011 1145   CL 95* 11/02/2011 1145   CO2 25 11/02/2011 1145   BUN 26* 11/02/2011 1145   CREATININE 1.00 11/02/2011 1145      Component Value Date/Time   CALCIUM 9.6 11/02/2011 1145       RADIOGRAPHIC STUDIES: Ct Chest W Contrast  11/02/2011  *RADIOLOGY REPORT*  Clinical Data: Shortness of breath  CT CHEST WITH CONTRAST  Technique:  Multidetector CT imaging of the chest was performed following the standard protocol during bolus administration of intravenous contrast.  Contrast: 80mL OMNIPAQUE IOHEXOL 300 MG/ML  SOLN  Comparison: None.  Findings:  No enlarged axillary or supraclavicular lymph nodes.  Right paratracheal lymph node measures 1 cm, image 17.  Low right paratracheal lymph node measures 1.1 cm,  image 25.   Heart size appears moderately enlarged.  Calcifications involving the LAD and left circumflex coronary arteries noted. There are small bilateral pleural effusions.  Left upper lobe pulmonary nodule measures 1.3 cm, image 30.  Within the left lower lobe there is a subpleural nodule measuring 1.6 x  2.1 cm, image 45.  Mass within the right base measures 30 x 2.9 cm, image 60.  Review of the visualized osseous structures is significant for mild thoracic spondylosis.  There are innumerable metastasis throughout both lobes of the liver.  Index lesion in the left hepatic lobe measures 3.0 x 2.2 cm, image 66.  Index lesion within the caudate lobe of the liver measures 2.5 x 2.4 cm, image 71.  Visualized portions of the pancreas appear normal.  The adrenal glands appear normal.  Cysts are noted within the upper pole of the left kidney.  IMPRESSION:  1.  Bilateral, multifocal pulmonary metastasis. 2.  Multifocal liver metastasis. 3.  Pleural effusions. 4.  Borderline enlarged mediastinal lymph nodes.  Original Report Authenticated By: Rosealee Albee, M.D.   Mr Laqueta Jean Wo Contrast  11/21/2011  *RADIOLOGY REPORT*  Clinical Data: Evaluate for intracranial metastatic disease of unknown primary with widespread liver mets. Large parapharyngeal mass in the right neck as well as suspected cecal mass.  Question colon cancer, question head neck squamous cell carcinoma.  MRI HEAD WITHOUT AND WITH CONTRAST  Technique:  Multiplanar, multiecho pulse sequences of the brain and surrounding structures were obtained according to standard protocol without and with intravenous contrast  Contrast: 15mL MULTIHANCE GADOBENATE DIMEGLUMINE 529 MG/ML IV SOLN  Comparison: PET scan 11/21/2011.  CT chest 11/02/2011.  Findings: There are foci of restricted diffusion in the right and left occipital lobes at the gray-white junction, both measuring approximately 3 mm in diameter felt to represent metastatic disease rather than acute  infarction.  Post contrast enhancement is confirmed of the right occipital lesion (image 20 series 13), but less well visualized  than left occipital lesion (image 14, series 15).  Possible third lesion right occipital cortex  (image 28 series 13), not associated with restricted diffusion.  No definite brainstem or cerebellar lesions.  No hemorrhage, hydrocephalus, or extra-axial fluid.  Moderate atrophy is present with chronic microvascular ischemic change.  No foci of chronic hemorrhage.  Negative pituitary and cerebellar tonsils.  Moderate pannus.  In the right parapharyngeal region, there is a 22 x 28 x 19 mm necrotic mass representing either a squamous cell carcinoma primary or a conglomerate mass of  lymph nodes.  Consider tissue sampling for further evaluation.  IMPRESSION: Suspected two and possibly three intracranial metastatic deposits. 3T MRI is more sensitive in the detection of multiple intracranial space-occupying lesions, and should be considered if the patient is a candidate for stereotactic radiosurgery.  22 x 28 x 19 mm right parapharyngeal space mass, incompletely evaluated, could represent a nodal mass or a head/neck primary. Correlate clinically. Recommend CT neck with contrast for further evaluation with possible tissue sampling.  Atrophy with chronic microvascular ischemic change.  Original Report Authenticated By: Elsie Stain, M.D.   Nm Pet Image Initial (pi) Skull Base To Thigh  11/21/2011  *RADIOLOGY REPORT*  Clinical Data: Initial treatment strategy for liver cancer with lung metastasis.  NUCLEAR MEDICINE PET SKULL BASE TO THIGH  Fasting Blood Glucose:  107  Technique:  16.1 mCi F-18 FDG was injected intravenously. CT data was obtained and used for attenuation correction and anatomic localization only.  (This was not acquired as a diagnostic CT examination.) Additional exam technical data entered on technologist worksheet.  Comparison:  11/02/2011  Findings:  Neck: Asymmetric  opacification of the right maxillary sinus is identified.  There is increased uptake within this right maxillary sinus which is nonspecific and may be due to inflammation.  Within the right parapharyngeal region/tonsillar pillar there is a hypermetabolic mass which measures 2.3 cm and has an SUV max equal to 15.8, image 30.  Chest:  No hypermetabolic supraclavicular or axillary lymph nodes. Multiple small mediastinal and hilar lymph nodes are identified which exhibit mild increased FDG uptake.  Low right paratracheal lymph node measures 10.3 mm and has an SUV max equal to 3.7, image 90.  There are bilateral pleural effusions present,  right greater than left.  Extensive, bilateral multifocal hypermetabolic pulmonary nodules are identified.  Index lesion within the posterior left lower lobe measures 2.1 cm and has an SUV max equal to 3.6, image 111.  Right lower lobe lesion measures 3.2 cm and has an SUV max equal to 7.9, image 123.  Abdomen/Pelvis:  Extensive multifocal liver metastasis are identified.  For reference purposes. lesion within the medial segment of the left hepatic lobe measures 3.6 cm and has an SUV max equal to 11.9, image 159.  Posterior right hepatic lobe lesion measures 4.3 cm and has an SUV max equal to 10.6, image 164.  The adrenal glands appear normal.  The pancreas is unremarkable. Normal appearance of the spleen.  Cysts within the left kidney are incompletely characterized without IV contrast.  The right kidney appears normal.  There is a large cecal mass present.  This mass measures approximately 7.5 x 5.4 cm and has an SUV max equal to 23.4.  There are multiple borderline enlarged, hypermetabolic ileocolic lymph nodes.  [place]:  There is abnormal FDG uptake identified within the L5 vertebral body.  Suspicious for bone metastasis.  The SUV max is equal to 5.6, image number 196.  IMPRESSION:  1.  Large mass within the cecum is concerning for primary colonic neoplasm.  There are several  ileocolic lymph nodes which are increased in size and the which exhibit increased FDG uptake are suspicious for local/regional lymph node metastasis. 2.  Extensive liver metastasis and lung metastasis. 3.  Borderline mediastinal and hilar lymph nodes.  Cannot rule out lymph node metastasis. 4.  Suspect bone metastasis to the L5 vertebral body. 5.  Large and intensely hypermetabolic lesion within the right posterior parapharyngeal region.  This may represent an area of metastasis.  Primary head neck carcinoma is not excluded.  Original Report Authenticated By: Rosealee Albee, M.D.   US Biopsy  11/14/2011  *RADIOLOGY REPORT*  Clinical data:    hepatic and pulmonary metastatic disease, unknown primary.  ULTRASOUND-GUIDED CORE LIVER LESION BIOPSY  Technique and findings: The procedure, risks (including but not limited to bleeding, infection, organ damage), benefits, and alternatives were explained to the patient.  Questions regarding the procedure were encouraged and answered.  The patient understands and consents to the procedure.Survey ultrasound of the liver was performed and an appropriate representative lesion was localized.  An appropriate skin entry site was determined. Operator donned sterile gloves and mask.   Site was marked, prepped with Betadine, draped in usual sterile fashion, infiltrated locally with 1% lidocaine.  Intravenous Fentanyl and Versed were administered as conscious sedation during continuous cardiorespiratory monitoring by the radiology RN, with a total moderate sedation time of 20 minutes.  Under real time ultrasound guidance, a 17 gauge trocar needle was advanced to the margin of the lesion.  Once needle tip position was confirmed, coaxial 18-gauge core biopsy samples were obtained.  The guide needle was removed.  Postprocedure scans show no hematoma or other apparent complication. The patient tolerated the procedure well.  IMPRESSION: 1.  Technically  successful ultrasound-guided core  liver lesion biopsy.  Original Report Authenticated By: Thora Lance III, M.D.    ASSESSMENT: This is a very pleasant 78 years old white male recently diagnosed with metastatic colon adenocarcinoma involving the brain, lung, liver and bones. The final pathology was consistent with metastatic adenocarcinoma of the colon with KRAS mutation  PLAN: I have a lengthy discussion with the patient and his family today about his current disease stage, prognosis and treatment options. I showed them the images of the PET scan as well as MRI of the brain. #1 I will refer the patient to Dr. Kathrynn Running for evaluation and consideration of stereotactic radiotherapy to his brain lesions. #2  I discussed with the patient his treatment options including systemic chemotherapy versus palliative care. The patient his family are interested in proceeding with systemic chemotherapy. This would be in the form of FOLFOX plus Avastin giving every 2 weeks. I discussed with the patient adverse effect of this treatment including but not limited to alopecia, myelosuppression, nausea and vomiting, peripheral neuropathy, liver or renal dysfunction in addition to increase this could GI perforation from the Avastin. #3 I would encourage addition to gastroenterology for evaluation and colonoscopy to evaluate the cecal lesion and make sure that the patient has no obstruction that required surgical intervention. #4 I will arrange for the patient to have a Port-A-Cath placed by interventional radiology for chemotherapy delivery access. #5 the patient would have a chemotherapy education class before starting the first cycle of his chemotherapy. #6 for anemia I started the patient on Integra plus 1 capsule by mouth daily. #7 for insomnia I gave the patient prescription for Ambien 10 mg by mouth each bedtime as needed. #8 the patient was given prescription for Compazine 10 mg by mouth every 6 hours as needed for nausea.  #9 the patient will  come back for followup visit in 2 weeks for evaluation and starting the first cycle of his systemic therapy with FOLFOX plus Avastin. He was advised to call me immediately she has any concerning symptoms in the interval.  I gave the patient and his family the time to ask questions and I answered them completely to their satisfaction.  All questions were answered. The patient knows to call the clinic with any problems, questions or concerns. We can certainly see the patient much sooner if necessary.  I spent 30 minutes counseling the patient face to face. The total time spent in the appointment was 50 minutes.

## 2011-11-23 ENCOUNTER — Telehealth: Payer: Self-pay | Admitting: Internal Medicine

## 2011-11-23 ENCOUNTER — Other Ambulatory Visit: Payer: Self-pay | Admitting: Radiation Therapy

## 2011-11-23 DIAGNOSIS — C7931 Secondary malignant neoplasm of brain: Secondary | ICD-10-CM

## 2011-11-23 NOTE — Telephone Encounter (Signed)
called pts wife and provided appts for Dr. Mitzi Hansen for 06/17 @ 3pm. called Dr. Arlyce Dice and was told to fax over notes to there office gv info to HIM to fax .  Gv wife appt for 06/19 and 06/24 and asked that she picks up schedule on 06/19

## 2011-11-26 ENCOUNTER — Encounter (HOSPITAL_COMMUNITY): Payer: Self-pay | Admitting: Pharmacy Technician

## 2011-11-26 ENCOUNTER — Other Ambulatory Visit: Payer: Self-pay | Admitting: Radiology

## 2011-11-26 ENCOUNTER — Telehealth (HOSPITAL_COMMUNITY): Payer: Self-pay | Admitting: Internal Medicine

## 2011-11-27 ENCOUNTER — Other Ambulatory Visit: Payer: Self-pay | Admitting: Internal Medicine

## 2011-11-27 ENCOUNTER — Ambulatory Visit (HOSPITAL_COMMUNITY)
Admission: RE | Admit: 2011-11-27 | Discharge: 2011-11-27 | Disposition: A | Discharge status: Home / Self Care | Payer: Medicare Other | Source: Ambulatory Visit | Attending: Internal Medicine | Admitting: Internal Medicine

## 2011-11-27 DIAGNOSIS — C189 Malignant neoplasm of colon, unspecified: Secondary | ICD-10-CM | POA: Insufficient documentation

## 2011-11-27 DIAGNOSIS — Z87891 Personal history of nicotine dependence: Secondary | ICD-10-CM | POA: Insufficient documentation

## 2011-11-27 DIAGNOSIS — C801 Malignant (primary) neoplasm, unspecified: Secondary | ICD-10-CM | POA: Insufficient documentation

## 2011-11-27 LAB — CBC
HCT: 31.6 % — ABNORMAL LOW (ref 39.0–52.0)
Hemoglobin: 9.3 g/dL — ABNORMAL LOW (ref 13.0–17.0)
MCH: 20.8 pg — ABNORMAL LOW (ref 26.0–34.0)
MCHC: 29.4 g/dL — ABNORMAL LOW (ref 30.0–36.0)
MCV: 70.5 fL — ABNORMAL LOW (ref 78.0–100.0)
Platelets: 637 K/uL — ABNORMAL HIGH (ref 150–400)
RBC: 4.48 MIL/uL (ref 4.22–5.81)
RDW: 22.6 % — ABNORMAL HIGH (ref 11.5–15.5)
WBC: 16.5 K/uL — ABNORMAL HIGH (ref 4.0–10.5)

## 2011-11-27 LAB — APTT: aPTT: 31 seconds (ref 24–37)

## 2011-11-27 MED ORDER — HEPARIN SOD (PORK) LOCK FLUSH 100 UNIT/ML IV SOLN
500.0000 [IU] | Freq: Once | INTRAVENOUS | Status: AC
  Administered 2011-11-27: 500 [IU] via INTRAVENOUS

## 2011-11-27 MED ORDER — LIDOCAINE HCL 1 % IJ SOLN
INTRAMUSCULAR | Status: AC
  Filled 2011-11-27: qty 20

## 2011-11-27 MED ORDER — FENTANYL CITRATE 0.05 MG/ML IJ SOLN
INTRAMUSCULAR | Status: AC | PRN
  Administered 2011-11-27: 100 ug via INTRAVENOUS

## 2011-11-27 MED ORDER — MIDAZOLAM HCL 2 MG/2ML IJ SOLN
INTRAMUSCULAR | Status: AC
  Filled 2011-11-27: qty 4

## 2011-11-27 MED ORDER — SODIUM CHLORIDE 0.9 % IV SOLN
Freq: Once | INTRAVENOUS | Status: AC
  Administered 2011-11-27: 15:00:00 via INTRAVENOUS

## 2011-11-27 MED ORDER — CEFAZOLIN SODIUM 1-5 GM-% IV SOLN
INTRAVENOUS | Status: AC
  Filled 2011-11-27: qty 50

## 2011-11-27 MED ORDER — CEFAZOLIN SODIUM 1-5 GM-% IV SOLN
1.0000 g | Freq: Once | INTRAVENOUS | Status: AC
  Administered 2011-11-27: 1 g via INTRAVENOUS

## 2011-11-27 MED ORDER — MIDAZOLAM HCL 5 MG/5ML IJ SOLN
INTRAMUSCULAR | Status: AC | PRN
  Administered 2011-11-27: 2 mg via INTRAVENOUS

## 2011-11-27 MED ORDER — FENTANYL CITRATE 0.05 MG/ML IJ SOLN
INTRAMUSCULAR | Status: AC
  Filled 2011-11-27: qty 4

## 2011-11-27 NOTE — H&P (Signed)
Agree 

## 2011-11-27 NOTE — H&P (Signed)
Joseph Rowland is an 78 y.o. male.   Chief Complaint: "I'm here for a port a cath" HPI: Patient with history of metastatic colon carcinoma presents today for port a cath placement for chemotherapy.  Past Medical History  Diagnosis Date  . Lung mass   . Liver cancer     Past Surgical History  Procedure Date  . Hernia repair     No family history on file. Social History:  reports that he quit smoking about 33 years ago. His smoking use included Cigarettes. He has never used smokeless tobacco. He reports that he does not drink alcohol or use illicit drugs.  Allergies: No Known Allergies  Current outpatient prescriptions:albuterol (PROVENTIL) (5 MG/ML) 0.5% nebulizer solution, Take 2.5 mg by nebulization every 6 (six) hours as needed., Disp: , Rfl: ;  amoxicillin (AMOXIL) 875 MG tablet, Take 875 mg by mouth 2 (two) times daily. For 20 days, Disp: , Rfl: ;  aspirin 81 MG tablet, Take 81 mg by mouth daily., Disp: , Rfl: ;  Biotin 300 MCG TABS, Take 300 mcg by mouth daily., Disp: , Rfl:  calcium carbonate 200 MG capsule, Take 250 mg by mouth daily., Disp: , Rfl: ;  cholecalciferol (VITAMIN D) 1000 UNITS tablet, Take 1,000 Units by mouth 2 (two) times daily., Disp: , Rfl: ;  Chromium 200 MCG CAPS, Take 120 mcg by mouth daily., Disp: , Rfl: ;  Copper Gluconate (COPPER CAPS PO), Take 2 mg by mouth daily., Disp: , Rfl: ;  FeFum-FePoly-FA-B Cmp-C-Biot (INTEGRA PLUS) CAPS, Take 1 capsule by mouth every morning., Disp: 30 capsule, Rfl: 2 ferrous sulfate 325 (65 FE) MG tablet, Take 325 mg by mouth 3 (three) times daily., Disp: , Rfl: ;  IODINE, KELP, PO, Take 150 mg by mouth daily., Disp: , Rfl: ;  Magnesium 100 MG CAPS, Take 100 mg by mouth daily., Disp: , Rfl: ;  MANGANESE PO, Take 2 mg by mouth daily., Disp: , Rfl: ;  Multiple Vitamin (MULITIVITAMIN) LIQD, Take 10 mLs by mouth daily., Disp: , Rfl: ;  niacin 50 MG tablet, Take 50 mg by mouth daily., Disp: , Rfl:  OVER THE COUNTER MEDICATION, Take 1 tablet  by mouth 3 (three) times daily. "Blood Pressurex", Disp: , Rfl: ;  OVER THE COUNTER MEDICATION, Take 1 tablet by mouth 2 (two) times daily. "Stress-J", Disp: , Rfl: ;  potassium chloride (K-DUR) 10 MEQ tablet, Take 10 mEq by mouth daily., Disp: , Rfl: ;  PROBIOTIC CAPS, Take 6 capsules by mouth daily., Disp: , Rfl:  prochlorperazine (COMPAZINE) 10 MG tablet, Take 1 tablet (10 mg total) by mouth every 6 (six) hours as needed., Disp: 60 tablet, Rfl: 0;  pyridOXINE (VITAMIN B-6) 50 MG tablet, Take 50 mg by mouth daily., Disp: , Rfl: ;  Riboflavin (VITAMIN B-2 PO), Take 50 mg by mouth daily., Disp: , Rfl: ;  selenium 50 MCG TABS, Take 100 mcg by mouth daily., Disp: , Rfl: ;  thiamine (VITAMIN B-1) 50 MG tablet, Take 50 mg by mouth daily., Disp: , Rfl:  UNABLE TO FIND, 50 mg. Pantothenic, Disp: , Rfl: ;  vitamin A 16109 UNIT capsule, Take 10,000 Units by mouth daily., Disp: , Rfl: ;  vitamin B-12 (CYANOCOBALAMIN) 500 MCG tablet, Take 2,500 mcg by mouth daily., Disp: , Rfl: ;  vitamin E 100 UNIT capsule, Take 100 Units by mouth daily., Disp: , Rfl: ;  vitamin k 100 MCG tablet, Take 80 mcg by mouth daily., Disp: , Rfl: ;  Zinc Sulfate (ZINC 15 PO), Take 10 mg by mouth daily., Disp: , Rfl:  zolpidem (AMBIEN) 10 MG tablet, Take 1 tablet (10 mg total) by mouth at bedtime as needed for sleep., Disp: 30 tablet, Rfl: 0 Current facility-administered medications:0.9 %  sodium chloride infusion, , Intravenous, Once, Brayton El, PA;  ceFAZolin (ANCEF) 1-5 GM-% IVPB, , , , ;  ceFAZolin (ANCEF) IVPB 1 g/50 mL premix, 1 g, Intravenous, Once, Brayton El, PA   Results for orders placed during the hospital encounter of 11/27/11 (from the past 48 hour(s))  APTT     Status: Normal   Collection Time   11/27/11 12:16 PM      Component Value Range Comment   aPTT 31  24 - 37 (seconds)   CBC     Status: Abnormal   Collection Time   11/27/11 12:16 PM      Component Value Range Comment   WBC 16.5 (*) 4.0 - 10.5 (K/uL)    RBC  4.48  4.22 - 5.81 (MIL/uL)    Hemoglobin 9.3 (*) 13.0 - 17.0 (g/dL)    HCT 16.1 (*) 09.6 - 52.0 (%)    MCV 70.5 (*) 78.0 - 100.0 (fL)    MCH 20.8 (*) 26.0 - 34.0 (pg)    MCHC 29.4 (*) 30.0 - 36.0 (g/dL)    RDW 04.5 (*) 40.9 - 15.5 (%)    Platelets 637 (*) 150 - 400 (K/uL)   PROTIME-INR     Status: Normal   Collection Time   11/27/11 12:16 PM      Component Value Range Comment   Prothrombin Time 13.9  11.6 - 15.2 (seconds)    INR 1.05  0.00 - 1.49     No results found.  Review of Systems  Constitutional: Negative for fever and chills.  Respiratory: Positive for shortness of breath. Negative for cough.   Cardiovascular: Negative for chest pain.  Gastrointestinal: Negative for nausea, vomiting and abdominal pain.  Musculoskeletal: Negative for back pain.  Neurological: Negative for headaches.  Endo/Heme/Allergies: Does not bruise/bleed easily.    Blood pressure 119/74, pulse 83, resp. rate 21, SpO2 99.00%. Physical Exam  Constitutional: He is oriented to person, place, and time. He appears well-developed and well-nourished.  Cardiovascular: Normal rate and regular rhythm.   Respiratory: Effort normal and breath sounds normal.  GI: Soft. Bowel sounds are normal.  Musculoskeletal: Normal range of motion. He exhibits edema.  Neurological: He is alert and oriented to person, place, and time.     Assessment/Plan Patient with metastatic colon carcinoma; plan is for port a cath placement for chemotherapy. Details/risks of above d/w pt/family with their understanding and consent.  Syndey Jaskolski,D KEVIN 11/27/2011, 1:23 PM

## 2011-11-27 NOTE — Procedures (Signed)
Procedure:  Portacath Access:  Right IJ vein Findings:  SL port via right IJ w/ tip at cavoatrial junction No PTX

## 2011-11-27 NOTE — Discharge Instructions (Signed)

## 2011-11-29 ENCOUNTER — Telehealth: Payer: Self-pay | Admitting: *Deleted

## 2011-11-29 ENCOUNTER — Inpatient Hospital Stay
Admission: RE | Admit: 2011-11-29 | Discharge: 2011-11-29 | Payer: Medicare Other | Source: Ambulatory Visit | Attending: Radiation Oncology | Admitting: Radiation Oncology

## 2011-11-29 ENCOUNTER — Ambulatory Visit
Admission: RE | Admit: 2011-11-29 | Discharge: 2011-11-29 | Disposition: A | Discharge status: Home / Self Care | Payer: Medicare Other | Source: Ambulatory Visit | Attending: Radiation Oncology | Admitting: Radiation Oncology

## 2011-11-29 DIAGNOSIS — C7931 Secondary malignant neoplasm of brain: Secondary | ICD-10-CM

## 2011-11-29 MED ORDER — GADOBENATE DIMEGLUMINE 529 MG/ML IV SOLN
15.0000 mL | Freq: Once | INTRAVENOUS | Status: AC | PRN
  Administered 2011-11-29: 15 mL via INTRAVENOUS

## 2011-11-29 NOTE — Telephone Encounter (Signed)
Please schedule an office visit for Mr. Joseph Rowland date of birth 03/18/2034, phone #[phone] for June 17 or June 18

## 2011-11-29 NOTE — Telephone Encounter (Signed)
APPOINTMETN FOR PT IS SCHEDULED ON 6/18 AT 3:15 PT AWARE

## 2011-11-30 ENCOUNTER — Other Ambulatory Visit: Payer: Medicare Other

## 2011-12-03 ENCOUNTER — Ambulatory Visit: Payer: Medicare Other | Admitting: Radiation Oncology

## 2011-12-03 ENCOUNTER — Ambulatory Visit: Payer: Medicare Other

## 2011-12-04 ENCOUNTER — Encounter: Payer: Self-pay | Admitting: *Deleted

## 2011-12-04 ENCOUNTER — Ambulatory Visit (INDEPENDENT_AMBULATORY_CARE_PROVIDER_SITE_OTHER): Payer: Medicare Other | Admitting: Gastroenterology

## 2011-12-04 ENCOUNTER — Encounter: Payer: Self-pay | Admitting: Gastroenterology

## 2011-12-04 VITALS — BP 108/64 | HR 72 | Ht 71.0 in | Wt 157.0 lb

## 2011-12-04 DIAGNOSIS — C801 Malignant (primary) neoplasm, unspecified: Secondary | ICD-10-CM

## 2011-12-04 DIAGNOSIS — C3490 Malignant neoplasm of unspecified part of unspecified bronchus or lung: Secondary | ICD-10-CM | POA: Insufficient documentation

## 2011-12-04 DIAGNOSIS — C787 Secondary malignant neoplasm of liver and intrahepatic bile duct: Secondary | ICD-10-CM

## 2011-12-04 DIAGNOSIS — C189 Malignant neoplasm of colon, unspecified: Secondary | ICD-10-CM

## 2011-12-04 DIAGNOSIS — C719 Malignant neoplasm of brain, unspecified: Secondary | ICD-10-CM | POA: Insufficient documentation

## 2011-12-04 MED ORDER — ONDANSETRON HCL 4 MG PO TABS: 4.0000 mg | ORAL_TABLET | Freq: Three times a day (TID) | ORAL | Status: DC | PRN

## 2011-12-04 MED ORDER — PEG-KCL-NACL-NASULF-NA ASC-C 100 G PO SOLR: 1.0000 | Freq: Once | ORAL | Status: DC

## 2011-12-04 NOTE — Progress Notes (Signed)
History of Present Illness: Joseph Rowland is a 77-year-old male referred at the request of Dr. Mohammed, with metastatic colon cancer to the lungs, liver and brain, for consideration of colonoscopy. Several months ago he was evaluated because of weakness and found to have a microscopic anemia. Several scans and biopsies later it was determined that he had a large cecal mass with metastases as described above. He is scheduled for radiation therapy evaluation and for chemotherapy. He complains of constipation. He denies abdominal pain, melena or hematochezia. He also complains of weakness and some dyspnea on exertion. He complains of fatigue, anorexia and has had a 12 pound weight loss.    Past Medical History  Diagnosis Date  . Lung mass   . Liver cancer   . metastatic adenocarcinoma 11/14/11    liver bx=metastatic adenocarcino0ma with tumor necrosis kras  mutation and involving brain,lung,and bones  . Brain cancer     lesions in occipital lobes,b/l suspicious mets  . Lung cancer     b/l multifocal pulmonary mets  . Hypertension   . Anemia    Past Surgical History  Procedure Date  . Hernia repair    family history is negative for Colon cancer. Current Outpatient Prescriptions  Medication Sig Dispense Refill  . albuterol (PROVENTIL) (5 MG/ML) 0.5% nebulizer solution Take 2.5 mg by nebulization every 6 (six) hours as needed.      . amoxicillin (AMOXIL) 875 MG tablet Take 875 mg by mouth 2 (two) times daily. For 20 days      . aspirin 81 MG tablet Take 81 mg by mouth daily.      . Biotin 300 MCG TABS Take 300 mcg by mouth daily.      . calcium carbonate 200 MG capsule Take 250 mg by mouth daily.      . cholecalciferol (VITAMIN D) 1000 UNITS tablet Take 1,000 Units by mouth 2 (two) times daily.      . Chromium 200 MCG CAPS Take 120 mcg by mouth daily.      . Copper Gluconate (COPPER CAPS PO) Take 2 mg by mouth daily.      . FeFum-FePoly-FA-B Cmp-C-Biot (INTEGRA PLUS) CAPS Take 1 capsule by  mouth every morning.  30 capsule  2  . ferrous sulfate 325 (65 FE) MG tablet Take 325 mg by mouth 3 (three) times daily.      . IODINE, KELP, PO Take 150 mg by mouth daily.      . Magnesium 100 MG CAPS Take 100 mg by mouth daily.      . MANGANESE PO Take 2 mg by mouth daily.      . Multiple Vitamin (MULITIVITAMIN) LIQD Take 10 mLs by mouth daily.      . niacin 50 MG tablet Take 50 mg by mouth daily.      . OVER THE COUNTER MEDICATION Take 1 tablet by mouth 3 (three) times daily. "Blood Pressurex"      . OVER THE COUNTER MEDICATION Take 1 tablet by mouth 2 (two) times daily. "Stress-J"      . potassium chloride (K-DUR) 10 MEQ tablet Take 10 mEq by mouth daily.      . PROBIOTIC CAPS Take 6 capsules by mouth daily.      . Promethazine HCl (PHENERGAN PO) Take by mouth as directed.      . pyridOXINE (VITAMIN B-6) 50 MG tablet Take 50 mg by mouth daily.      . Riboflavin (VITAMIN B-2 PO) Take 50 mg by   mouth daily.      . selenium 50 MCG TABS Take 100 mcg by mouth daily.      . thiamine (VITAMIN B-1) 50 MG tablet Take 50 mg by mouth daily.      . UNABLE TO FIND 50 mg. Pantothenic      . vitamin A 10000 UNIT capsule Take 10,000 Units by mouth daily.      . vitamin B-12 (CYANOCOBALAMIN) 500 MCG tablet Take 2,500 mcg by mouth daily.      . vitamin E 100 UNIT capsule Take 100 Units by mouth daily.      . vitamin k 100 MCG tablet Take 80 mcg by mouth daily.      . Zinc Sulfate (ZINC 15 PO) Take 10 mg by mouth daily.      . ciprofloxacin (CIPRO) 500 MG tablet Use as directed      . furosemide (LASIX) 20 MG tablet Use as directed      . zolpidem (AMBIEN) 10 MG tablet Take 1 tablet (10 mg total) by mouth at bedtime as needed for sleep.  30 tablet  0   Allergies as of 12/04/2011  . (No Known Allergies)    reports that he quit smoking about 33 years ago. His smoking use included Cigarettes. He uses smokeless tobacco. He reports that he does not drink alcohol or use illicit drugs.     Review of  Systems: Pertinent positive and negative review of systems were noted in the above HPI section. All other review of systems were otherwise negative.  Vital signs were reviewed in today's medical record Physical Exam: General: Well developed , well nourished, no acute distress Head: Normocephalic and atraumatic Eyes:  sclerae anicteric, EOMI Ears: Normal auditory acuity Mouth: No deformity or lesions Neck: Supple, no masses or thyromegaly Lungs: Clear throughout to auscultation Heart: Regular rate and rhythm; no murmurs, rubs or bruits Abdomen: Soft, non tender and non distended. Liver is palpable 4 fingerbreadths below the right costal margin. There is a questionable mass in the right lower quadrant. Rectal:deferred Musculoskeletal: Symmetrical with no gross deformities  Skin: No lesions on visible extremities Pulses:  Normal pulses noted Extremities: No clubbing, cyanosis,  or deformities noted. There is 1+ bilateral ankle edema Neurological: Alert oriented x 4, grossly nonfocal Cervical Nodes:  No significant cervical adenopathy Inguinal Nodes: No significant inguinal adenopathy Psychological:  Alert and cooperative. Normal mood and affect       

## 2011-12-04 NOTE — Patient Instructions (Addendum)
Your Colonoscopy is scheduled on 12/06/2011 Separate instructions have been given You will start a clear liquid diet today You will drink one mag citrate tonight

## 2011-12-04 NOTE — Assessment & Plan Note (Signed)
Probable cecal mass with metastatic lesions. At issue is whether he is at risk for obstruction.  Recommendations #1 colonoscopy

## 2011-12-05 ENCOUNTER — Ambulatory Visit
Admission: RE | Admit: 2011-12-05 | Discharge: 2011-12-05 | Disposition: A | Discharge status: Home / Self Care | Payer: Medicare Other | Source: Ambulatory Visit | Attending: Radiation Oncology | Admitting: Radiation Oncology

## 2011-12-05 ENCOUNTER — Encounter: Payer: Self-pay | Admitting: *Deleted

## 2011-12-05 ENCOUNTER — Ambulatory Visit (HOSPITAL_BASED_OUTPATIENT_CLINIC_OR_DEPARTMENT_OTHER): Payer: Medicare Other | Admitting: Internal Medicine

## 2011-12-05 ENCOUNTER — Encounter: Payer: Self-pay | Admitting: Radiation Oncology

## 2011-12-05 ENCOUNTER — Telehealth: Payer: Self-pay | Admitting: *Deleted

## 2011-12-05 ENCOUNTER — Telehealth: Payer: Self-pay | Admitting: Internal Medicine

## 2011-12-05 ENCOUNTER — Other Ambulatory Visit (HOSPITAL_BASED_OUTPATIENT_CLINIC_OR_DEPARTMENT_OTHER): Payer: Medicare Other | Admitting: Lab

## 2011-12-05 VITALS — BP 123/76 | HR 98 | Temp 96.8°F | Ht 71.0 in | Wt 155.2 lb

## 2011-12-05 DIAGNOSIS — I10 Essential (primary) hypertension: Secondary | ICD-10-CM | POA: Insufficient documentation

## 2011-12-05 DIAGNOSIS — C189 Malignant neoplasm of colon, unspecified: Secondary | ICD-10-CM

## 2011-12-05 DIAGNOSIS — Z85118 Personal history of other malignant neoplasm of bronchus and lung: Secondary | ICD-10-CM | POA: Insufficient documentation

## 2011-12-05 DIAGNOSIS — C7931 Secondary malignant neoplasm of brain: Secondary | ICD-10-CM | POA: Insufficient documentation

## 2011-12-05 DIAGNOSIS — C7839 Secondary malignant neoplasm of other respiratory organs: Secondary | ICD-10-CM

## 2011-12-05 DIAGNOSIS — C7949 Secondary malignant neoplasm of other parts of nervous system: Secondary | ICD-10-CM

## 2011-12-05 DIAGNOSIS — C719 Malignant neoplasm of brain, unspecified: Secondary | ICD-10-CM

## 2011-12-05 DIAGNOSIS — C787 Secondary malignant neoplasm of liver and intrahepatic bile duct: Secondary | ICD-10-CM

## 2011-12-05 DIAGNOSIS — Z510 Encounter for antineoplastic radiation therapy: Secondary | ICD-10-CM | POA: Insufficient documentation

## 2011-12-05 LAB — CBC WITH DIFFERENTIAL/PLATELET
BASO%: 0.2 % (ref 0.0–2.0)
Basophils Absolute: 0 10*3/uL (ref 0.0–0.1)
Eosinophils Absolute: 0 10*3/uL (ref 0.0–0.5)
HCT: 31.2 % — ABNORMAL LOW (ref 38.4–49.9)
HGB: 9.2 g/dL — ABNORMAL LOW (ref 13.0–17.1)
MONO#: 1.3 10*3/uL — ABNORMAL HIGH (ref 0.1–0.9)
NEUT#: 13.8 10*3/uL — ABNORMAL HIGH (ref 1.5–6.5)
NEUT%: 85.6 % — ABNORMAL HIGH (ref 39.0–75.0)
Platelets: 582 10*3/uL — ABNORMAL HIGH (ref 140–400)
WBC: 16.1 10*3/uL — ABNORMAL HIGH (ref 4.0–10.3)
lymph#: 1 10*3/uL (ref 0.9–3.3)

## 2011-12-05 LAB — COMPREHENSIVE METABOLIC PANEL
Alkaline Phosphatase: 501 U/L — ABNORMAL HIGH (ref 39–117)
CO2: 25 mEq/L (ref 19–32)
Creatinine, Ser: 0.76 mg/dL (ref 0.50–1.35)
Glucose, Bld: 113 mg/dL — ABNORMAL HIGH (ref 70–99)
Total Bilirubin: 1.3 mg/dL — ABNORMAL HIGH (ref 0.3–1.2)

## 2011-12-05 LAB — CEA: CEA: 4.7 ng/mL (ref 0.0–5.0)

## 2011-12-05 MED ORDER — SODIUM CHLORIDE 0.9 % IJ SOLN
10.0000 mL | Freq: Once | INTRAMUSCULAR | Status: AC
  Administered 2011-12-05: 10 mL via INTRAVENOUS

## 2011-12-05 NOTE — Addendum Note (Signed)
Encounter addended by: Lowella Petties, RN on: 12/05/2011  3:15 PM<BR>     Documentation filed: Inpatient MAR

## 2011-12-05 NOTE — Telephone Encounter (Signed)
Dr. Mitzi Hansen with radiation oncology called and wanted to let Dr. Arlyce Dice know that the appointment for colonoscopy will work fine with patient's schedule

## 2011-12-05 NOTE — Addendum Note (Signed)
Encounter addended by: Lowella Petties, RN on: 12/05/2011  3:13 PM<BR>     Documentation filed: Inpatient Document Flowsheet

## 2011-12-05 NOTE — Progress Notes (Signed)
Please see the Nurse Progress Note in the MD Initial Consult Encounter for this patient. 

## 2011-12-05 NOTE — Telephone Encounter (Signed)
l/m with 6/24 appt and to get a sch at that time   aom

## 2011-12-05 NOTE — Progress Notes (Signed)
Deacessed Portacath in right chest wall region.  Brisk blood return and flushed with10cc's NS and 5 ml of Heparin per protocol.  Site without any redness, bruising or bleeding.  Mr. Uppal  Denies any discomfort at the portacath site.

## 2011-12-05 NOTE — Progress Notes (Signed)
Patient brought back via w/c from Chemo education to room 7, using protocol accessed patients right power port acath with sterile technique, after patient gave name and dob as identification, with 10cc ns wwith excellent blood return, patient tolerated well, no c/o , patient then taken via w/c for CT simulation 3:10 PM

## 2011-12-05 NOTE — Addendum Note (Signed)
Encounter addended by: Lowella Petties, RN on: 12/05/2011  3:14 PM<BR>     Documentation filed: Orders

## 2011-12-05 NOTE — Progress Notes (Signed)
[facility] Radiation Oncology NEW PATIENT EVALUATION  Name: Joseph Rowland MRN: [id number]  Date:   12/05/2011           DOB: 11-24-1933   CC: [name], FNP  Si Gaul, MD    REFERRING PHYSICIAN: Si Gaul, MD   DIAGNOSIS: The encounter diagnosis was Brain metastases.    HISTORY OF PRESENT ILLNESS:  Joseph Rowland is a 78 y.o. male who is seen today for consideration of palliative radiation treatment. He is a pleasant 78 year old male who noticed some shortness of breath which led to a chest x-ray. Unfortunately this showed some bilateral pulmonary nodules and a CT scan of the chest showed diffuse apparent metastatic disease. The patient has complained in addition to the shortness of breath of some weight loss and in retrospect some constipation as well. He really had not noticed any major changes in his bowel movements. He is also not noticed any blood per rectum and he denies any pain currently. The patient has had workup including a PET scan which showed diffuse metastatic disease involving the lungs liver and bone. I should note that there was a hypermetabolic area within the right tonsillar region as well which may require some further evaluation. The patient has undergone an ultrasound guided biopsy of a liver lesion and this returned positive for adenocarcinoma consistent with a metastatic colon cancer. On the PET scan the patient did have a large mass within the cecum consistent with a primary colonic neoplasm. He also has undergone an MRI scan of the brain which showed 2 intracranial metastatic deposits posteriorly within the occipital region with a third possible lesion as well which was more indistinct. For these areas measured approximately 3 mm. The patient has undergone a 3-T. MRI scan of the brain for planning in anticipation of a possible course of stereotactic radiosurgery and no additional findings were noted.  PREVIOUS RADIATION THERAPY:  No   PAST MEDICAL HISTORY:  has a past medical history of Lung mass; Liver cancer; metastatic adenocarcinoma (11/14/11); Brain cancer; Lung cancer; Hypertension; and Anemia.     PAST SURGICAL HISTORY:  Past Surgical History  Procedure Date  . Hernia repair   . Right port a cath     power port right subclavian     FAMILY HISTORY: family history is negative for Colon cancer.   SOCIAL HISTORY:  reports that he quit smoking about 33 years ago. His smoking use included Cigarettes. He uses smokeless tobacco. He reports that he does not drink alcohol or use illicit drugs.   ALLERGIES: Review of patient's allergies indicates no known allergies.   MEDICATIONS:  Current Outpatient Prescriptions  Medication Sig Dispense Refill  . albuterol (PROVENTIL) (5 MG/ML) 0.5% nebulizer solution Take 2.5 mg by nebulization every 6 (six) hours as needed.      Marland Kitchen amoxicillin (AMOXIL) 875 MG tablet Take 875 mg by mouth 2 (two) times daily. For 20 days      . amoxicillin-clavulanate (AUGMENTIN) 875-125 MG per tablet 875 mg by Per post-pyloric tube route Twice daily.      Marland Kitchen aspirin 81 MG tablet Take 81 mg by mouth daily.      . Biotin 300 MCG TABS Take 300 mcg by mouth daily.      . calcium carbonate 200 MG capsule Take 250 mg by mouth daily.      . cholecalciferol (VITAMIN D) 1000 UNITS tablet Take 1,000 Units by mouth 2 (two) times daily.      . Chromium  200 MCG CAPS Take 120 mcg by mouth daily.      . ciprofloxacin (CIPRO) 500 MG tablet Take 500 mg by mouth every morning.      . Copper Gluconate (COPPER CAPS PO) Take 2 mg by mouth daily.      Marland Kitchen FeFum-FePoly-FA-B Cmp-C-Biot (INTEGRA PLUS) CAPS Take 1 capsule by mouth every morning.  30 capsule  2  . furosemide (LASIX) 20 MG tablet       . IODINE, KELP, PO Take 150 mg by mouth daily.      . Magnesium 100 MG CAPS Take 100 mg by mouth daily.      Marland Kitchen MANGANESE PO Take 2 mg by mouth daily.      . Multiple Vitamin (MULITIVITAMIN) LIQD Take 10 mLs by mouth  daily.      . niacin 50 MG tablet Take 50 mg by mouth daily.      . ondansetron (ZOFRAN) 4 MG tablet Take 1 tablet (4 mg total) by mouth every 8 (eight) hours as needed for nausea.  60 tablet  3  . peg 3350 powder (MOVIPREP) 100 G SOLR Take 1 kit (100 g total) by mouth once.  1 kit  0  . potassium chloride (K-DUR) 10 MEQ tablet Take 10 mEq by mouth daily.      Marland Kitchen PROBIOTIC CAPS Take 6 capsules by mouth daily.      . Promethazine HCl (PHENERGAN PO) Take by mouth as directed.      . pyridOXINE (VITAMIN B-6) 50 MG tablet Take 50 mg by mouth daily.      . Riboflavin (VITAMIN B-2 PO) Take 50 mg by mouth daily.      Marland Kitchen selenium 50 MCG TABS Take 100 mcg by mouth daily.      Marland Kitchen thiamine (VITAMIN B-1) 50 MG tablet Take 50 mg by mouth daily.      Marland Kitchen UNABLE TO FIND 50 mg. Pantothenic      . vitamin A 16109 UNIT capsule Take 10,000 Units by mouth daily.      . vitamin B-12 (CYANOCOBALAMIN) 500 MCG tablet Take 2,500 mcg by mouth daily.      . vitamin E 100 UNIT capsule Take 100 Units by mouth daily.      . vitamin k 100 MCG tablet Take 80 mcg by mouth daily.      . Zinc Sulfate (ZINC 15 PO) Take 10 mg by mouth daily.      Marland Kitchen zolpidem (AMBIEN) 10 MG tablet Take 1 tablet (10 mg total) by mouth at bedtime as needed for sleep.  30 tablet  0   No current facility-administered medications for this encounter.   Facility-Administered Medications Ordered in Other Encounters  Medication Dose Route Frequency Provider Last Rate Last Dose  . sodium chloride 0.9 % injection 10 mL  10 mL Intravenous Once Jonna Coup, MD   10 mL at 12/05/11 1515     REVIEW OF SYSTEMS:  Pertinent items are noted in HPI.    PHYSICAL EXAM:  vitals were not taken for this visit.  General: Well-developed, in no acute distress, sitting in a wheelchair HEENT: Normocephalic, atraumatic Cardiovascular: Regular rate and rhythm Respiratory: Clear to auscultation bilaterally GI: Soft, nontender, normal bowel sounds Extremities: No edema  present   LABORATORY DATA:  Lab Results  Component Value Date   WBC 16.1* 12/05/2011   HGB 9.2* 12/05/2011   HCT 31.2* 12/05/2011   MCV 71.6* 12/05/2011   PLT 582* 12/05/2011   Lab  Results  Component Value Date   NA 136 12/05/2011   K 4.5 12/05/2011   CL 99 12/05/2011   CO2 25 12/05/2011   Lab Results  Component Value Date   ALT 81* 12/05/2011   AST 92* 12/05/2011   ALKPHOS 501* 12/05/2011   BILITOT 1.3* 12/05/2011      IMPRESSION: Pleasant 78 year old male with metastatic adenocarcinoma of the colon. The patient has diffuse metastatic disease including at least 2 small brain metastases.   PLAN: The patient is an appropriate candidate for a course of intracranial radiosurgery. In reviewing the MRI scan I believe it would be appropriate to treat the 2 clear areas of disease. The third area may represent a small metastatic deposit although it is also possible that this is not the case. We will pay close attention to this on ongoing followup.  I therefore discussed the benefit of such a course of treatment including the possible side effects and risks including a detailed discussion of such issues as radionecrosis. All the patient's questions were answered. After this discussion he does wish to proceed with a simulation later today such that we can proceed with treatment planning. I anticipate treating him with a single fraction of radiotherapy to 20 gray on 12/07/2011 daily 2 metastatic lesions intracranially.  The patient has additional disease systemically and he is scheduled to begin chemotherapy next week. After were personally reviewing his imaging and discussing his symptoms with him today I don't see a clear area I believe we will at need to treat currently although there is an area in the L5 vertebral body which at a minimum needs to be followed and this I believe is the most likely significant candidate area for additional radiation at some point. The patient currently is not having any  pain associated with this and it may be reasonable to see how he responds with chemotherapy as well. Certainly this could be treated if necessary.    I spent 60 minutes minutes face to face with the patient and more than 50% of that time was spent in counseling and/or coordination of care.

## 2011-12-05 NOTE — Progress Notes (Signed)
Patient and family here for new consult,patient is alert,oriented x3, on clear liquid diet,has colonoscopy tomorrow, no c/o pain, or headaches,nausea, or blurred vision at presnt,right eye does get blurred if out in sunlight Married, Allergies:nkda \ Path on Liver BX on 11/14/11=Metastatic Adenocarcinoma Metastatic colon cancer(KRAS mutation)with mets to brain,lung,liver and bone dx May 2013 11/29/11 MRI Brain=lesions bilaterally on occipital lobes suspicious for metsastatic disease 1:06 PM

## 2011-12-05 NOTE — Progress Notes (Signed)
[facility] [phone]   [phone]  OFFICE PROGRESS NOTE  [name], FNP [address] [place] [address]  DIAGNOSIS: Metastatic colon adenocarcinoma (KRAS mutation) with metastatic lesion to the brain, lung, liver and bone diagnosed in May of 2013   PRIOR THERAPY: None   CURRENT THERAPY: The patient will start the first cycle of chemotherapy with FOLFOX/Avastin on 12/10/2011.   INTERVAL HISTORY: Joseph Rowland 78 y.o. male returns to the clinic today for followup visit accompanied by his wife, son and daughter-in-law. The patient is doing fine today with no specific complaints except for mild fatigue. He scheduled to have a colonoscopy performed tomorrow under the care of Dr. Arlyce Dice. He was seen by Dr. Mitzi Hansen for consideration of stereotactic radiotherapy to the brain lesions and he had repeat MRI of the brain which showed no other metastatic disease besides the already known brain lesions. He still have some insomnia but he did not take the Ambien because of concern about the side effects. The patient also has a Port-A-Cath placed by interventional radiology. He has no significant nausea or vomiting. He has no chest pain or shortness of breath. He is here today for evaluation before starting the first cycle of chemotherapy on 12/10/2011.  MEDICAL HISTORY: Past Medical History  Diagnosis Date  . Lung mass   . Liver cancer   . metastatic adenocarcinoma 11/14/11    liver bx=metastatic adenocarcino61ma with tumor necrosis kras  mutation and involving brain,lung,and bones  . Brain cancer     lesions in occipital lobes,b/l suspicious mets  . Lung cancer     b/l multifocal pulmonary mets  . Hypertension   . Anemia     ALLERGIES:   has no known allergies.  MEDICATIONS:  Current Outpatient Prescriptions  Medication Sig Dispense Refill  . albuterol (PROVENTIL) (5 MG/ML) 0.5% nebulizer solution Take 2.5 mg by nebulization every  6 (six) hours as needed.      Marland Kitchen amoxicillin (AMOXIL) 875 MG tablet Take 875 mg by mouth 2 (two) times daily. For 20 days      . aspirin 81 MG tablet Take 81 mg by mouth daily.      . Biotin 300 MCG TABS Take 300 mcg by mouth daily.      . calcium carbonate 200 MG capsule Take 250 mg by mouth daily.      . cholecalciferol (VITAMIN D) 1000 UNITS tablet Take 1,000 Units by mouth 2 (two) times daily.      . Chromium 200 MCG CAPS Take 120 mcg by mouth daily.      . Copper Gluconate (COPPER CAPS PO) Take 2 mg by mouth daily.      Marland Kitchen FeFum-FePoly-FA-B Cmp-C-Biot (INTEGRA PLUS) CAPS Take 1 capsule by mouth every morning.  30 capsule  2  . furosemide (LASIX) 20 MG tablet Use as directed      . IODINE, KELP, PO Take 150 mg by mouth daily.      . Magnesium 100 MG CAPS Take 100 mg by mouth daily.      Marland Kitchen MANGANESE PO Take 2 mg by mouth daily.      . Multiple Vitamin (MULITIVITAMIN) LIQD Take 10 mLs by mouth daily.      . niacin 50 MG tablet Take 50 mg by mouth daily.      . ondansetron (ZOFRAN) 4 MG tablet Take 1 tablet (4 mg total) by mouth every 8 (eight) hours as needed for nausea.  60 tablet  3  . peg 3350 powder (MOVIPREP) 100 G SOLR Take 1 kit (100 g total) by mouth once.  1 kit  0  . PROBIOTIC CAPS Take 6 capsules by mouth daily.      Marland Kitchen pyridOXINE (VITAMIN B-6) 50 MG tablet Take 50 mg by mouth daily.      . Riboflavin (VITAMIN B-2 PO) Take 50 mg by mouth daily.      Marland Kitchen selenium 50 MCG TABS Take 100 mcg by mouth daily.      Marland Kitchen thiamine (VITAMIN B-1) 50 MG tablet Take 50 mg by mouth daily.      Marland Kitchen UNABLE TO FIND 50 mg. Pantothenic      . vitamin A 09811 UNIT capsule Take 10,000 Units by mouth daily.      . vitamin B-12 (CYANOCOBALAMIN) 500 MCG tablet Take 2,500 mcg by mouth daily.      . vitamin E 100 UNIT capsule Take 100 Units by mouth daily.      . vitamin k 100 MCG tablet Take 80 mcg by mouth daily.      . Zinc Sulfate (ZINC 15 PO) Take 10 mg by mouth daily.      Marland Kitchen zolpidem (AMBIEN) 10 MG tablet  Take 1 tablet (10 mg total) by mouth at bedtime as needed for sleep.  30 tablet  0  . potassium chloride (K-DUR) 10 MEQ tablet Take 10 mEq by mouth daily.      . Promethazine HCl (PHENERGAN PO) Take by mouth as directed.        SURGICAL HISTORY:  Past Surgical History  Procedure Date  . Hernia repair     REVIEW OF SYSTEMS:  A comprehensive review of systems was negative except for: Constitutional: positive for fatigue   PHYSICAL EXAMINATION: General appearance: alert, cooperative, fatigued and no distress Head: Normocephalic, without obvious abnormality, atraumatic Neck: no adenopathy Lymph nodes: Cervical, supraclavicular, and axillary nodes normal. Resp: clear to auscultation bilaterally Cardio: regular rate and rhythm, S1, S2 normal, no murmur, click, rub or gallop GI: soft, non-tender; bowel sounds normal; no masses,  no organomegaly Extremities: extremities normal, atraumatic, no cyanosis or edema Neurologic: Alert and oriented X 3, normal strength and tone. Normal symmetric reflexes. Normal coordination and gait  ECOG PERFORMANCE STATUS: 1 - Symptomatic but completely ambulatory  Blood pressure 123/76, pulse 98, temperature 96.8 F (36 C), temperature source Oral, height 5\' 11"  (1.803 m), weight 155 lb 3.2 oz (70.398 kg).  LABORATORY DATA: Lab Results  Component Value Date   WBC 16.1* 12/05/2011   HGB 9.2* 12/05/2011   HCT 31.2* 12/05/2011   MCV 71.6* 12/05/2011   PLT 582* 12/05/2011      Chemistry      Component Value Date/Time   NA 136 11/22/2011 1036   K 4.7 11/22/2011 1036   CL 102 11/22/2011 1036   CO2 27 11/22/2011 1036   BUN 25* 11/22/2011 1036   CREATININE 0.71 11/22/2011 1036      Component Value Date/Time   CALCIUM 8.7 11/22/2011 1036   ALKPHOS 428* 11/22/2011 1036   AST 75* 11/22/2011 1036   ALT 57* 11/22/2011 1036   BILITOT 0.8 11/22/2011 1036       RADIOGRAPHIC STUDIES: Mr Laqueta Jean BJ Contrast  2011-12-10  *RADIOLOGY REPORT*  Clinical Data: Metastatic disease.   Liver masses, right colon mass, lung mass, right para pharyngeal mass.  MRI HEAD WITHOUT AND WITH CONTRAST  Technique:  Multiplanar, multiecho pulse sequences of the brain and  surrounding structures were obtained according to standard protocol without and with intravenous contrast  Contrast: 15mL MULTIHANCE GADOBENATE DIMEGLUMINE 529 MG/ML IV SOLN  Comparison: MRI 11/21/2011  Findings: Stereotactic radiosurgery protocol performed at 3 Tesla with thin section high resolution imaging.  Right para pharyngeal mass measures approximately  2 cm and appears smaller compared with the prior study.  This is not well evaluated on this study of the brain.  This was positive on PET scan may represent a primary or secondary malignancy or possibly an area of infection.  2 x 5 mm enhancing mass in the right lower occipital lobe with restricted diffusion is similar to the prior MRI.  This is most likely due to metastatic disease.  There is a possible second enhancing nodule in the right occipital cortex on axial image number 80.  I would favor this is metastatic disease rather than vascular enhancement however it is possibly a vessel.  2 mm enhancing nodule left occipital lobe axial image number 82, compatible with metastatic disease.  No other enhancing lesions however given the small size of lesions, other small lesions could be present and not seen by this study.  Ventricle size is normal.  Age appropriate atrophy.  Mild chronic microvascular ischemia in the white matter.  Degenerative changes at C1-C2 with significant thickening of the transverse ligament.  No deformity of the cord. Right maxillary sinusitis with extensive mucosal thickening and air fluid level due to retained secretions.  IMPRESSION: Small enhancing lesions in the occipital lobes bilaterally, suspicious for metastatic disease.  Right para pharyngeal soft tissue mass is not as well seen as on the prior study but  remains suspicious for tumor particularly given  the increased uptake on PET scan.  This may be a tonsillar carcinoma.  Original Report Authenticated By: Camelia Phenes, M.D.   Mr Laqueta Jean Wo Contrast  11/21/2011  *RADIOLOGY REPORT*  Clinical Data: Evaluate for intracranial metastatic disease of unknown primary with widespread liver mets. Large parapharyngeal mass in the right neck as well as suspected cecal mass.  Question colon cancer, question head neck squamous cell carcinoma.  MRI HEAD WITHOUT AND WITH CONTRAST  Technique:  Multiplanar, multiecho pulse sequences of the brain and surrounding structures were obtained according to standard protocol without and with intravenous contrast  Contrast: 15mL MULTIHANCE GADOBENATE DIMEGLUMINE 529 MG/ML IV SOLN  Comparison: PET scan 11/21/2011.  CT chest 11/02/2011.  Findings: There are foci of restricted diffusion in the right and left occipital lobes at the gray-white junction, both measuring approximately 3 mm in diameter felt to represent metastatic disease rather than acute infarction.  Post contrast enhancement is confirmed of the right occipital lesion (image 20 series 13), but less well visualized  than left occipital lesion (image 14, series 15).  Possible third lesion right occipital cortex  (image 28 series 13), not associated with restricted diffusion.  No definite brainstem or cerebellar lesions.  No hemorrhage, hydrocephalus, or extra-axial fluid.  Moderate atrophy is present with chronic microvascular ischemic change.  No foci of chronic hemorrhage.  Negative pituitary and cerebellar tonsils.  Moderate pannus.  In the right parapharyngeal region, there is a 22 x 28 x 19 mm necrotic mass representing either a squamous cell carcinoma primary or a conglomerate mass of  lymph nodes.  Consider tissue sampling for further evaluation.  IMPRESSION: Suspected two and possibly three intracranial metastatic deposits. 3T MRI is more sensitive in the detection of multiple intracranial space-occupying lesions, and  should be considered if the  patient is a candidate for stereotactic radiosurgery.  22 x 28 x 19 mm right parapharyngeal space mass, incompletely evaluated, could represent a nodal mass or a head/neck primary. Correlate clinically. Recommend CT neck with contrast for further evaluation with possible tissue sampling.  Atrophy with chronic microvascular ischemic change.  Original Report Authenticated By: Elsie Stain, M.D.   Nm Pet Image Initial (pi) Skull Base To Thigh  11/21/2011  *RADIOLOGY REPORT*  Clinical Data: Initial treatment strategy for liver cancer with lung metastasis.  NUCLEAR MEDICINE PET SKULL BASE TO THIGH  Fasting Blood Glucose:  107  Technique:  16.1 mCi F-18 FDG was injected intravenously. CT data was obtained and used for attenuation correction and anatomic localization only.  (This was not acquired as a diagnostic CT examination.) Additional exam technical data entered on technologist worksheet.  Comparison:  11/02/2011  Findings:  Neck: Asymmetric opacification of the right maxillary sinus is identified.  There is increased uptake within this right maxillary sinus which is nonspecific and may be due to inflammation.  Within the right parapharyngeal region/tonsillar pillar there is a hypermetabolic mass which measures 2.3 cm and has an SUV max equal to 15.8, image 30.  Chest:  No hypermetabolic supraclavicular or axillary lymph nodes. Multiple small mediastinal and hilar lymph nodes are identified which exhibit mild increased FDG uptake.  Low right paratracheal lymph node measures 10.3 mm and has an SUV max equal to 3.7, image 90.  There are bilateral pleural effusions present,  right greater than left.  Extensive, bilateral multifocal hypermetabolic pulmonary nodules are identified.  Index lesion within the posterior left lower lobe measures 2.1 cm and has an SUV max equal to 3.6, image 111.  Right lower lobe lesion measures 3.2 cm and has an SUV max equal to 7.9, image 123.  Abdomen/Pelvis:   Extensive multifocal liver metastasis are identified.  For reference purposes. lesion within the medial segment of the left hepatic lobe measures 3.6 cm and has an SUV max equal to 11.9, image 159.  Posterior right hepatic lobe lesion measures 4.3 cm and has an SUV max equal to 10.6, image 164.  The adrenal glands appear normal.  The pancreas is unremarkable. Normal appearance of the spleen.  Cysts within the left kidney are incompletely characterized without IV contrast.  The right kidney appears normal.  There is a large cecal mass present.  This mass measures approximately 7.5 x 5.4 cm and has an SUV max equal to 23.4.  There are multiple borderline enlarged, hypermetabolic ileocolic lymph nodes.  [place]:  There is abnormal FDG uptake identified within the L5 vertebral body.  Suspicious for bone metastasis.  The SUV max is equal to 5.6, image number 196.  IMPRESSION:  1.  Large mass within the cecum is concerning for primary colonic neoplasm.  There are several ileocolic lymph nodes which are increased in size and the which exhibit increased FDG uptake are suspicious for local/regional lymph node metastasis. 2.  Extensive liver metastasis and lung metastasis. 3.  Borderline mediastinal and hilar lymph nodes.  Cannot rule out lymph node metastasis. 4.  Suspect bone metastasis to the L5 vertebral body. 5.  Large and intensely hypermetabolic lesion within the right posterior parapharyngeal region.  This may represent an area of metastasis.  Primary head neck carcinoma is not excluded.  Original Report Authenticated By: Rosealee Albee, M.D.   US Biopsy  11/14/2011  *RADIOLOGY REPORT*  Clinical data:    hepatic and pulmonary metastatic disease, unknown primary.  ULTRASOUND-GUIDED CORE LIVER LESION BIOPSY  Technique and findings: The procedure, risks (including but not limited to bleeding, infection, organ damage), benefits, and alternatives were explained to the patient.  Questions regarding the procedure were  encouraged and answered.  The patient understands and consents to the procedure.Survey ultrasound of the liver was performed and an appropriate representative lesion was localized.  An appropriate skin entry site was determined. Operator donned sterile gloves and mask.   Site was marked, prepped with Betadine, draped in usual sterile fashion, infiltrated locally with 1% lidocaine.  Intravenous Fentanyl and Versed were administered as conscious sedation during continuous cardiorespiratory monitoring by the radiology RN, with a total moderate sedation time of 20 minutes.  Under real time ultrasound guidance, a 17 gauge trocar needle was advanced to the margin of the lesion.  Once needle tip position was confirmed, coaxial 18-gauge core biopsy samples were obtained.  The guide needle was removed.  Postprocedure scans show no hematoma or other apparent complication. The patient tolerated the procedure well.  IMPRESSION: 1.  Technically successful ultrasound-guided core liver lesion biopsy.  Original Report Authenticated By: Osa Craver, M.D.   Ir Fluoro Guide Cv Line Right  11/27/2011  *RADIOLOGY REPORT*  Clinical Data: Metastatic colon cancer.  The patient presents for Port-A-Cath placement to begin chemotherapy.  IMPLANTED PORT A CATH PLACEMENT WITH ULTRASOUND AND FLUOROSCOPIC GUIDANCE  Sedation:  2.0 mg IV Versed; 100 mcg IV Fentanyl.  Total Moderate Sedation Time:  45 minutes.  Additional Medications:  1 gram IV Ancef.  As antibiotic prophylaxis, Ancef 1 gm was ordered pre-procedure and administered intravenously within one hour of incision.  Fluoroscopy Time:  0.5 minutes.  Procedure:  The procedure, risks, benefits, and alternatives were explained to the patient.  Questions regarding the procedure were encouraged and answered.  The patient understands and consents to the procedure.  The right neck and chest were prepped with chlorhexidine in a sterile fashion, and a sterile drape was applied covering  the operative field.  Maximum barrier sterile technique with sterile gowns and gloves were used for the procedure.  Local anesthesia was provided with 1% lidocaine and lidocaine with epinephrine.  After creating a small venotomy incision, a 21 gauge needle was advanced into the right internal jugular vein under direct, real- time ultrasound guidance.  Ultrasound image documentation was performed.  After securing guidewire access, an 8 Fr dilator was placed.  A J-wire was kinked to measure appropriate catheter length.  A subcutaneous port pocket was then created along the upper chest wall utilizing sharp and blunt dissection.  Portable cautery was utilized.  The pocket was irrigated with sterile saline.  A single lumen power injectable port was chosen for placement.  The 8 Fr catheter was tunneled from the port pocket site to the venotomy incision.  The port was placed in the pocket and secured with two Ethilon tacking sutures.  External catheter was trimmed to appropriate length based on guidewire measurement.  At the venotomy, an 8 Fr peel-away sheath was placed over a guidewire.  The catheter was then placed through the sheath and the sheath removed.  Final catheter positioning was confirmed and documented with a fluoroscopic spot image.  The port was accessed with a needle and aspirated and flushed with heparinized saline. The needle was removed.  The venotomy and port pocket incisions were closed with subcutaneous 3-0 Monocryl and subcuticular 4-0 Vicryl.  Dermabond was applied to both incisions.  Complications: None.  No pneumothorax.  Findings:  After catheter placement, the tip lies at the cavoatrial junction.  The catheter aspirates normally and is ready for immediate use.  IMPRESSION:  Placement of single lumen port a cath via right internal jugular vein.  The catheter tip lies at the cavoatrial junction.  A power injectable port a cath was placed and is ready for immediate use.  Original Report  Authenticated By: Reola Calkins, M.D.   Ir US Guide Vasc Access Right  11/27/2011  *RADIOLOGY REPORT*  Clinical Data: Metastatic colon cancer.  The patient presents for Port-A-Cath placement to begin chemotherapy.  IMPLANTED PORT A CATH PLACEMENT WITH ULTRASOUND AND FLUOROSCOPIC GUIDANCE  Sedation:  2.0 mg IV Versed; 100 mcg IV Fentanyl.  Total Moderate Sedation Time:  45 minutes.  Additional Medications:  1 gram IV Ancef.  As antibiotic prophylaxis, Ancef 1 gm was ordered pre-procedure and administered intravenously within one hour of incision.  Fluoroscopy Time:  0.5 minutes.  Procedure:  The procedure, risks, benefits, and alternatives were explained to the patient.  Questions regarding the procedure were encouraged and answered.  The patient understands and consents to the procedure.  The right neck and chest were prepped with chlorhexidine in a sterile fashion, and a sterile drape was applied covering the operative field.  Maximum barrier sterile technique with sterile gowns and gloves were used for the procedure.  Local anesthesia was provided with 1% lidocaine and lidocaine with epinephrine.  After creating a small venotomy incision, a 21 gauge needle was advanced into the right internal jugular vein under direct, real- time ultrasound guidance.  Ultrasound image documentation was performed.  After securing guidewire access, an 8 Fr dilator was placed.  A J-wire was kinked to measure appropriate catheter length.  A subcutaneous port pocket was then created along the upper chest wall utilizing sharp and blunt dissection.  Portable cautery was utilized.  The pocket was irrigated with sterile saline.  A single lumen power injectable port was chosen for placement.  The 8 Fr catheter was tunneled from the port pocket site to the venotomy incision.  The port was placed in the pocket and secured with two Ethilon tacking sutures.  External catheter was trimmed to appropriate length based on guidewire  measurement.  At the venotomy, an 8 Fr peel-away sheath was placed over a guidewire.  The catheter was then placed through the sheath and the sheath removed.  Final catheter positioning was confirmed and documented with a fluoroscopic spot image.  The port was accessed with a needle and aspirated and flushed with heparinized saline. The needle was removed.  The venotomy and port pocket incisions were closed with subcutaneous 3-0 Monocryl and subcuticular 4-0 Vicryl.  Dermabond was applied to both incisions.  Complications: None.  No pneumothorax.  Findings:  After catheter placement, the tip lies at the cavoatrial junction.  The catheter aspirates normally and is ready for immediate use.  IMPRESSION:  Placement of single lumen port a cath via right internal jugular vein.  The catheter tip lies at the cavoatrial junction.  A power injectable port a cath was placed and is ready for immediate use.  Original Report Authenticated By: Reola Calkins, M.D.    ASSESSMENT: This is a very pleasant 78 years old white male with metastatic colon adenocarcinoma with extensive liver metastases as well as brain and lung metastases.  PLAN: The patient is doing fine today. We'll proceed with his systemic chemotherapy with FOLFOX and Avastin as scheduled early next week. He would have a colonoscopy  tomorrow by Dr. Arlyce Dice. I will arrange for the patient to have a chemotherapy education class later today. He would come back for followup visit in 3 weeks with the start of cycle #2. He was advised to call me immediately if he has any concerning symptoms in the interval He was given prescription for Emla cream today to be applied to the Port-A-Cath site before treatment.  All questions were answered. The patient knows to call the clinic with any problems, questions or concerns. We can certainly see the patient much sooner if necessary.  I spent 20 minutes counseling the patient face to face. The total time spent in the  appointment was 30 minutes.

## 2011-12-06 ENCOUNTER — Encounter (HOSPITAL_COMMUNITY)
Admission: RE | Disposition: A | Discharge status: Home / Self Care | Payer: Self-pay | Source: Ambulatory Visit | Attending: Gastroenterology

## 2011-12-06 ENCOUNTER — Ambulatory Visit (HOSPITAL_COMMUNITY)
Admission: RE | Admit: 2011-12-06 | Discharge: 2011-12-06 | Disposition: A | Discharge status: Home / Self Care | Payer: Medicare Other | Source: Ambulatory Visit | Attending: Gastroenterology | Admitting: Gastroenterology

## 2011-12-06 ENCOUNTER — Other Ambulatory Visit: Payer: Self-pay | Admitting: *Deleted

## 2011-12-06 ENCOUNTER — Ambulatory Visit: Payer: Medicare Other

## 2011-12-06 ENCOUNTER — Encounter: Payer: Self-pay | Admitting: *Deleted

## 2011-12-06 ENCOUNTER — Ambulatory Visit: Payer: Medicare Other | Admitting: Radiation Oncology

## 2011-12-06 ENCOUNTER — Encounter (HOSPITAL_COMMUNITY): Payer: Self-pay | Admitting: Gastroenterology

## 2011-12-06 ENCOUNTER — Encounter: Payer: Medicare Other | Admitting: Gastroenterology

## 2011-12-06 DIAGNOSIS — K5730 Diverticulosis of large intestine without perforation or abscess without bleeding: Secondary | ICD-10-CM | POA: Insufficient documentation

## 2011-12-06 DIAGNOSIS — I10 Essential (primary) hypertension: Secondary | ICD-10-CM | POA: Insufficient documentation

## 2011-12-06 DIAGNOSIS — K639 Disease of intestine, unspecified: Secondary | ICD-10-CM | POA: Insufficient documentation

## 2011-12-06 DIAGNOSIS — C7951 Secondary malignant neoplasm of bone: Secondary | ICD-10-CM | POA: Insufficient documentation

## 2011-12-06 DIAGNOSIS — D126 Benign neoplasm of colon, unspecified: Secondary | ICD-10-CM | POA: Insufficient documentation

## 2011-12-06 DIAGNOSIS — C7949 Secondary malignant neoplasm of other parts of nervous system: Secondary | ICD-10-CM | POA: Insufficient documentation

## 2011-12-06 DIAGNOSIS — Z7982 Long term (current) use of aspirin: Secondary | ICD-10-CM | POA: Insufficient documentation

## 2011-12-06 DIAGNOSIS — K921 Melena: Secondary | ICD-10-CM | POA: Insufficient documentation

## 2011-12-06 DIAGNOSIS — C228 Malignant neoplasm of liver, primary, unspecified as to type: Secondary | ICD-10-CM | POA: Insufficient documentation

## 2011-12-06 DIAGNOSIS — C189 Malignant neoplasm of colon, unspecified: Secondary | ICD-10-CM

## 2011-12-06 DIAGNOSIS — C7800 Secondary malignant neoplasm of unspecified lung: Secondary | ICD-10-CM | POA: Insufficient documentation

## 2011-12-06 DIAGNOSIS — Z79899 Other long term (current) drug therapy: Secondary | ICD-10-CM | POA: Insufficient documentation

## 2011-12-06 DIAGNOSIS — C7931 Secondary malignant neoplasm of brain: Secondary | ICD-10-CM | POA: Insufficient documentation

## 2011-12-06 HISTORY — PX: COLONOSCOPY: SHX5424

## 2011-12-06 SURGERY — COLONOSCOPY
Anesthesia: Moderate Sedation

## 2011-12-06 MED ORDER — SODIUM CHLORIDE 0.9 % IV SOLN
Freq: Once | INTRAVENOUS | Status: AC
  Administered 2011-12-06: 500 mL via INTRAVENOUS

## 2011-12-06 MED ORDER — MIDAZOLAM HCL 10 MG/2ML IJ SOLN
INTRAMUSCULAR | Status: DC | PRN
  Administered 2011-12-06 (×2): 2 mg via INTRAVENOUS
  Administered 2011-12-06: 1 mg via INTRAVENOUS

## 2011-12-06 MED ORDER — FENTANYL CITRATE 0.05 MG/ML IJ SOLN
INTRAMUSCULAR | Status: AC
  Filled 2011-12-06: qty 2

## 2011-12-06 MED ORDER — SODIUM CHLORIDE 0.9 % IJ SOLN
10.0000 mL | Freq: Once | INTRAMUSCULAR | Status: AC
  Administered 2011-12-05: 10 mL via INTRAVENOUS

## 2011-12-06 MED ORDER — FENTANYL NICU IV SYRINGE 50 MCG/ML
INJECTION | INTRAMUSCULAR | Status: DC | PRN
  Administered 2011-12-06: 12.5 ug via INTRAVENOUS
  Administered 2011-12-06 (×2): 25 ug via INTRAVENOUS

## 2011-12-06 MED ORDER — SODIUM CHLORIDE 0.9 % IJ SOLN: 10.0000 mL | Freq: Once | INTRAMUSCULAR | Status: DC

## 2011-12-06 MED ORDER — HEPARIN SOD (PORK) LOCK FLUSH 100 UNIT/ML IV SOLN
500.0000 [IU] | Freq: Once | INTRAVENOUS | Status: AC
  Administered 2011-12-05: 500 [IU] via INTRAVENOUS

## 2011-12-06 MED ORDER — MIDAZOLAM HCL 10 MG/2ML IJ SOLN
INTRAMUSCULAR | Status: AC
  Filled 2011-12-06: qty 2

## 2011-12-06 MED ORDER — HEPARIN SOD (PORK) LOCK FLUSH 100 UNIT/ML IV SOLN: 500.0000 [IU] | Freq: Once | INTRAVENOUS | Status: DC

## 2011-12-06 NOTE — Discharge Instructions (Addendum)
Endoscopy Care After Please read the instructions outlined below and refer to this sheet in the next few weeks. These discharge instructions provide you with general information on caring for yourself after you leave the hospital. Your doctor may also give you specific instructions. While your treatment has been planned according to the most current medical practices available, unavoidable complications occasionally occur. If you have any problems or questions after discharge, please call your doctor. HOME CARE INSTRUCTIONS Activity  You may resume your regular activity but move at a slower pace for the next 24 hours.   Take frequent rest periods for the next 24 hours.   Walking will help expel (get rid of) the air and reduce the bloated feeling in your abdomen.   No driving for 24 hours (because of the anesthesia (medicine) used during the test).   You may shower.   Do not sign any important legal documents or operate any machinery for 24 hours (because of the anesthesia used during the test).  Nutrition  Drink plenty of fluids.   You may resume your normal diet.   Begin with a light meal and progress to your normal diet.   Avoid alcoholic beverages for 24 hours or as instructed by your caregiver.  Medications You may resume your normal medications unless your caregiver tells you otherwise. What you can expect today  You may experience abdominal discomfort such as a feeling of fullness or "gas" pains.   You may experience a sore throat for 2 to 3 days. This is normal. Gargling with salt water may help this.  Follow-up Your doctor will discuss the results of your test with you. SEEK IMMEDIATE MEDICAL CARE IF:  You have excessive nausea (feeling sick to your stomach) and/or vomiting.   You have severe abdominal pain and distention (swelling).   You have trouble swallowing.   You have a temperature over 100 F (37.8 C).   You have rectal bleeding or vomiting of blood.    Document Released: 01/17/2004 Document Revised: 05/24/2011 Document Reviewed: 07/30/2007 [employer] Patient Information 2012 [employer]. 

## 2011-12-06 NOTE — Addendum Note (Signed)
Encounter addended by: Lowella Petties, RN on: 12/06/2011  7:40 AM<BR>     Documentation filed: Visit Diagnoses, Orders

## 2011-12-06 NOTE — H&P (View-Only) (Signed)
History of Present Illness: Mr. Negron is a 78 year old male referred at the request of Dr. 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 Several months ago he was evaluated because of weakness and found to have a microscopic anemia. Several scans and biopsies later it was determined that he had a large cecal mass with metastases as described above. He is scheduled for radiation therapy evaluation and for chemotherapy. He complains of constipation. He denies abdominal pain, melena or hematochezia. He also complains of weakness and some dyspnea on exertion. He complains of fatigue, anorexia and has had a 12 pound weight loss.    Past Medical History  Diagnosis Date  . Lung mass   . Liver cancer   . metastatic adenocarcinoma 11/14/11    liver bx=metastatic adenocarcino22ma with tumor necrosis kras  mutation and involving brain,lung,and bones  . Brain cancer     lesions in occipital lobes,b/l suspicious mets  . Lung cancer     b/l multifocal pulmonary mets  . Hypertension   . Anemia    Past Surgical History  Procedure Date  . Hernia repair    family history is negative for Colon cancer. Current Outpatient Prescriptions  Medication Sig Dispense Refill  . albuterol (PROVENTIL) (5 MG/ML) 0.5% nebulizer solution Take 2.5 mg by nebulization every 6 (six) hours as needed.      Marland Kitchen amoxicillin (AMOXIL) 875 MG tablet Take 875 mg by mouth 2 (two) times daily. For 20 days      . aspirin 81 MG tablet Take 81 mg by mouth daily.      . Biotin 300 MCG TABS Take 300 mcg by mouth daily.      . calcium carbonate 200 MG capsule Take 250 mg by mouth daily.      . cholecalciferol (VITAMIN D) 1000 UNITS tablet Take 1,000 Units by mouth 2 (two) times daily.      . Chromium 200 MCG CAPS Take 120 mcg by mouth daily.      . Copper Gluconate (COPPER CAPS PO) Take 2 mg by mouth daily.      Marland Kitchen FeFum-FePoly-FA-B Cmp-C-Biot (INTEGRA PLUS) CAPS Take 1 capsule by  mouth every morning.  30 capsule  2  . ferrous sulfate 325 (65 FE) MG tablet Take 325 mg by mouth 3 (three) times daily.      . IODINE, KELP, PO Take 150 mg by mouth daily.      . Magnesium 100 MG CAPS Take 100 mg by mouth daily.      Marland Kitchen MANGANESE PO Take 2 mg by mouth daily.      . Multiple Vitamin (MULITIVITAMIN) LIQD Take 10 mLs by mouth daily.      . niacin 50 MG tablet Take 50 mg by mouth daily.      Marland Kitchen OVER THE COUNTER MEDICATION Take 1 tablet by mouth 3 (three) times daily. "Blood Pressurex"      . OVER THE COUNTER MEDICATION Take 1 tablet by mouth 2 (two) times daily. "Stress-J"      . potassium chloride (K-DUR) 10 MEQ tablet Take 10 mEq by mouth daily.      Marland Kitchen PROBIOTIC CAPS Take 6 capsules by mouth daily.      . Promethazine HCl (PHENERGAN PO) Take by mouth as directed.      . pyridOXINE (VITAMIN B-6) 50 MG tablet Take 50 mg by mouth daily.      . Riboflavin (VITAMIN B-2 PO) Take 50 mg by  mouth daily.      Marland Kitchen selenium 50 MCG TABS Take 100 mcg by mouth daily.      Marland Kitchen thiamine (VITAMIN B-1) 50 MG tablet Take 50 mg by mouth daily.      Marland Kitchen UNABLE TO FIND 50 mg. Pantothenic      . vitamin A 40981 UNIT capsule Take 10,000 Units by mouth daily.      . vitamin B-12 (CYANOCOBALAMIN) 500 MCG tablet Take 2,500 mcg by mouth daily.      . vitamin E 100 UNIT capsule Take 100 Units by mouth daily.      . vitamin k 100 MCG tablet Take 80 mcg by mouth daily.      . Zinc Sulfate (ZINC 15 PO) Take 10 mg by mouth daily.      . ciprofloxacin (CIPRO) 500 MG tablet Use as directed      . furosemide (LASIX) 20 MG tablet Use as directed      . zolpidem (AMBIEN) 10 MG tablet Take 1 tablet (10 mg total) by mouth at bedtime as needed for sleep.  30 tablet  0   Allergies as of 12/04/2011  . (No Known Allergies)    reports that he quit smoking about 33 years ago. His smoking use included Cigarettes. He uses smokeless tobacco. He reports that he does not drink alcohol or use illicit drugs.     Review of  Systems: Pertinent positive and negative review of systems were noted in the above HPI section. All other review of systems were otherwise negative.  Vital signs were reviewed in today's medical record Physical Exam: General: Well developed , well nourished, no acute distress Head: Normocephalic and atraumatic Eyes:  sclerae anicteric, EOMI Ears: Normal auditory acuity Mouth: No deformity or lesions Neck: Supple, no masses or thyromegaly Lungs: Clear throughout to auscultation Heart: Regular rate and rhythm; no murmurs, rubs or bruits Abdomen: Soft, non tender and non distended. Liver is palpable 4 fingerbreadths below the right costal margin. There is a questionable mass in the right lower quadrant. Rectal:deferred Musculoskeletal: Symmetrical with no gross deformities  Skin: No lesions on visible extremities Pulses:  Normal pulses noted Extremities: No clubbing, cyanosis,  or deformities noted. There is 1+ bilateral ankle edema Neurological: Alert oriented x 4, grossly nonfocal Cervical Nodes:  No significant cervical adenopathy Inguinal Nodes: No significant inguinal adenopathy Psychological:  Alert and cooperative. Normal mood and affect

## 2011-12-06 NOTE — Op Note (Signed)
[facility] [address] [place], [place]  [address]  COLONOSCOPY PROCEDURE REPORT  PATIENT:  Layton, Naves  MR#:  [id number] BIRTHDATE:  05/10/1934, 77 yrs. old  GENDER:  male ENDOSCOPIST:  Barbette Hair. Arlyce Dice, MD REF. BY:  Si Gaul, M.D. PROCEDURE DATE:  12/06/2011 PROCEDURE:  Colonoscopy with biopsy ASA CLASS:  Class III INDICATIONS:  blood in stool MEDICATIONS:   These medications were titrated to patient response per physician's verbal order, Fentanyl 62.5 mcg IV, Versed 5 mg IV  DESCRIPTION OF PROCEDURE:   After the risks benefits and alternatives of the procedure were thoroughly explained, informed consent was obtained.  No rectal exam performed. The Pentax Colonoscope [id number] endoscope was introduced through the anus and advanced to the cecum, which was identified by the appearance, without limitations.  The quality of the prep was excellent, using MoviPrep.  The instrument was then slowly withdrawn as the colon was fully examined. <<PROCEDUREIMAGES>>  FINDINGS:  A mass was found in the cecum. Exophytic, friable mass encompassing the cecum. Biopsies were taken (see image1).  A sessile polyp was found in the ascending colon. It was 3 mm in size (see image3).  Scattered diverticula were found. sigmoid to descending colon  This was otherwise a normal examination of the colon (see image4).   Retroflexed views in the rectum revealed no abnormalities.    The time to cecum =  minutes. The scope was then withdrawn in  minutes from the cecum and the procedure completed. COMPLICATIONS:  None ENDOSCOPIC IMPRESSION: 1) Mass in the cecum 2) 3 mm sessile polyp in the ascending colon 3) Diverticula, scattered 4) Otherwise normal examination RECOMMENDATIONS:per oncology  REPEAT EXAM:  No  ______________________________ Barbette Hair. Arlyce Dice, MD  CC:  n. eSIGNED:   Barbette Hair. Aron Inge at 12/06/2011 02:17 PM  Kristopher Oppenheim, [phone]

## 2011-12-06 NOTE — Interval H&P Note (Signed)
History and Physical Interval Note:  12/06/2011 1:21 PM  Joseph Rowland  has presented today for surgery, with the diagnosis of colon cancer  The various methods of treatment have been discussed with the patient and family. After consideration of risks, benefits and other options for treatment, the patient has consented to  Procedure(s) (LRB): COLONOSCOPY (N/A) as a surgical intervention .  The patient's history has been reviewed, patient examined, no change in status, stable for surgery.  I have reviewed the patients' chart and labs.  Questions were answered to the patient's satisfaction.    The recent H&P (dated *12/04/11**) was reviewed, the patient was examined and there is no change in the patients condition since that H&P was completed.   Melvia Heaps  12/06/2011, 1:21 PM    Melvia Heaps
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
## 2011-12-07 ENCOUNTER — Encounter (HOSPITAL_COMMUNITY): Payer: Self-pay | Admitting: Gastroenterology

## 2011-12-07 ENCOUNTER — Ambulatory Visit
Admission: RE | Admit: 2011-12-07 | Discharge: 2011-12-07 | Disposition: A | Discharge status: Home / Self Care | Payer: Medicare Other | Source: Ambulatory Visit | Attending: Radiation Oncology | Admitting: Radiation Oncology

## 2011-12-07 ENCOUNTER — Encounter (HOSPITAL_COMMUNITY): Payer: Self-pay

## 2011-12-07 ENCOUNTER — Other Ambulatory Visit: Payer: Self-pay | Admitting: Medical Oncology

## 2011-12-07 VITALS — BP 117/75 | HR 87 | Temp 97.6°F | Resp 20

## 2011-12-07 DIAGNOSIS — Z923 Personal history of irradiation: Secondary | ICD-10-CM

## 2011-12-07 DIAGNOSIS — C7949 Secondary malignant neoplasm of other parts of nervous system: Secondary | ICD-10-CM

## 2011-12-07 DIAGNOSIS — C189 Malignant neoplasm of colon, unspecified: Secondary | ICD-10-CM

## 2011-12-07 HISTORY — DX: Personal history of irradiation: Z92.3

## 2011-12-07 NOTE — Progress Notes (Signed)
Patient brought to nursing room 1, vitals wnl, 97.7,118/76,85-20, no c/o pain or nausea,no headache, gave remote control for tv and  Call button ,will recheck vitals in 1 hour 1:59 PM

## 2011-12-07 NOTE — Progress Notes (Signed)
Patient 2nd set vitals taken,wnl, no c/o "I feel greaet", no pain,nausea,blurred vision,or headache,will inform MD 2:49 PM

## 2011-12-08 ENCOUNTER — Other Ambulatory Visit: Payer: Self-pay | Admitting: Internal Medicine

## 2011-12-10 ENCOUNTER — Ambulatory Visit (HOSPITAL_BASED_OUTPATIENT_CLINIC_OR_DEPARTMENT_OTHER): Payer: Medicare Other

## 2011-12-10 ENCOUNTER — Other Ambulatory Visit (HOSPITAL_BASED_OUTPATIENT_CLINIC_OR_DEPARTMENT_OTHER): Payer: Medicare Other | Admitting: Lab

## 2011-12-10 ENCOUNTER — Ambulatory Visit: Payer: Medicare Other | Admitting: Nutrition

## 2011-12-10 VITALS — BP 112/66 | HR 85 | Temp 96.0°F

## 2011-12-10 DIAGNOSIS — C189 Malignant neoplasm of colon, unspecified: Secondary | ICD-10-CM

## 2011-12-10 DIAGNOSIS — C787 Secondary malignant neoplasm of liver and intrahepatic bile duct: Secondary | ICD-10-CM

## 2011-12-10 DIAGNOSIS — Z5111 Encounter for antineoplastic chemotherapy: Secondary | ICD-10-CM

## 2011-12-10 DIAGNOSIS — C7931 Secondary malignant neoplasm of brain: Secondary | ICD-10-CM

## 2011-12-10 LAB — COMPREHENSIVE METABOLIC PANEL
ALT: 89 U/L — ABNORMAL HIGH (ref 0–53)
BUN: 17 mg/dL (ref 6–23)
CO2: 30 mEq/L (ref 19–32)
Creatinine, Ser: 0.6 mg/dL (ref 0.50–1.35)
Total Bilirubin: 1 mg/dL (ref 0.3–1.2)

## 2011-12-10 LAB — CBC WITH DIFFERENTIAL/PLATELET
BASO%: 0.3 % (ref 0.0–2.0)
Eosinophils Absolute: 0.1 10*3/uL (ref 0.0–0.5)
HCT: 29.9 % — ABNORMAL LOW (ref 38.4–49.9)
LYMPH%: 5 % — ABNORMAL LOW (ref 14.0–49.0)
MCHC: 29.4 g/dL — ABNORMAL LOW (ref 32.0–36.0)
MCV: 72.2 fL — ABNORMAL LOW (ref 79.3–98.0)
MONO%: 7.8 % (ref 0.0–14.0)
NEUT%: 86.1 % — ABNORMAL HIGH (ref 39.0–75.0)
Platelets: 511 10*3/uL — ABNORMAL HIGH (ref 140–400)
RBC: 4.14 10*6/uL — ABNORMAL LOW (ref 4.20–5.82)
nRBC: 0 % (ref 0–0)

## 2011-12-10 MED ORDER — DEXTROSE 5 % IV SOLN
Freq: Once | INTRAVENOUS | Status: AC
  Administered 2011-12-10: 11:00:00 via INTRAVENOUS

## 2011-12-10 MED ORDER — FLUOROURACIL CHEMO INJECTION 2.5 GM/50ML
400.0000 mg/m2 | Freq: Once | INTRAVENOUS | Status: AC
  Administered 2011-12-10: 750 mg via INTRAVENOUS
  Filled 2011-12-10: qty 15

## 2011-12-10 MED ORDER — OXALIPLATIN CHEMO INJECTION 100 MG/20ML
85.0000 mg/m2 | Freq: Once | INTRAVENOUS | Status: AC
  Administered 2011-12-10: 160 mg via INTRAVENOUS
  Filled 2011-12-10: qty 32

## 2011-12-10 MED ORDER — SODIUM CHLORIDE 0.9 % IV SOLN
2400.0000 mg/m2 | INTRAVENOUS | Status: DC
  Administered 2011-12-10: 4550 mg via INTRAVENOUS
  Filled 2011-12-10: qty 91

## 2011-12-10 MED ORDER — SODIUM CHLORIDE 0.9 % IV SOLN
5.0000 mg/kg | Freq: Once | INTRAVENOUS | Status: AC
  Administered 2011-12-10: 350 mg via INTRAVENOUS
  Filled 2011-12-10: qty 14

## 2011-12-10 MED ORDER — ONDANSETRON 8 MG/50ML IVPB (CHCC)
8.0000 mg | Freq: Once | INTRAVENOUS | Status: AC
  Administered 2011-12-10: 8 mg via INTRAVENOUS

## 2011-12-10 MED ORDER — DEXAMETHASONE SODIUM PHOSPHATE 10 MG/ML IJ SOLN
10.0000 mg | Freq: Once | INTRAMUSCULAR | Status: AC
  Administered 2011-12-10: 10 mg via INTRAVENOUS

## 2011-12-10 MED ORDER — SODIUM CHLORIDE 0.9 % IJ SOLN
10.0000 mL | INTRAMUSCULAR | Status: DC | PRN
  Filled 2011-12-10: qty 10

## 2011-12-10 MED ORDER — HEPARIN SOD (PORK) LOCK FLUSH 100 UNIT/ML IV SOLN
500.0000 [IU] | Freq: Once | INTRAVENOUS | Status: DC | PRN
  Filled 2011-12-10: qty 5

## 2011-12-10 MED ORDER — SODIUM CHLORIDE 0.9 % IV SOLN
Freq: Once | INTRAVENOUS | Status: AC
  Administered 2011-12-10: 10:00:00 via INTRAVENOUS

## 2011-12-10 MED ORDER — LEUCOVORIN CALCIUM INJECTION 350 MG
400.0000 mg/m2 | Freq: Once | INTRAVENOUS | Status: AC
  Administered 2011-12-10: 760 mg via INTRAVENOUS
  Filled 2011-12-10: qty 38

## 2011-12-10 NOTE — Patient Instructions (Addendum)
[facility] Discharge Instructions for Patients Receiving Chemotherapy  Today you received the following chemotherapy agents Avastin, Oxalitplatin, Leucovorin and 5FU (Adrucil)  To help prevent nausea and vomiting after your treatment, we encourage you to take your nausea medication. Begin taking it as often as prescribed for by Dr. Arbutus Ped.    If you develop nausea and vomiting that is not controlled by your nausea medication, call the clinic. If it is after clinic hours your family physician or the after hours number for the clinic or go to the Emergency Department.   BELOW ARE SYMPTOMS THAT SHOULD BE REPORTED IMMEDIATELY:  *FEVER GREATER THAN 100.5 F  *CHILLS WITH OR WITHOUT FEVER  NAUSEA AND VOMITING THAT IS NOT CONTROLLED WITH YOUR NAUSEA MEDICATION  *UNUSUAL SHORTNESS OF BREATH  *UNUSUAL BRUISING OR BLEEDING  TENDERNESS IN MOUTH AND THROAT WITH OR WITHOUT PRESENCE OF ULCERS  *URINARY PROBLEMS  *BOWEL PROBLEMS  UNUSUAL RASH Items with * indicate a potential emergency and should be followed up as soon as possible.  One of the nurses will contact you 24 hours after your treatment. Please let the nurse know about any problems that you may have experienced. Feel free to call the clinic you have any questions or concerns. The clinic phone number is [phone].   I have been informed and understand all the instructions given to me. I know to contact the clinic, my physician, or go to the Emergency Department if any problems should occur. I do not have any questions at this time, but understand that I may call the clinic during office hours   should I have any questions or need assistance in obtaining follow up care.    __________________________________________  _____________  __________ Signature of Patient or Authorized Representative            Date                   Time    __________________________________________ Nurse's Signature

## 2011-12-10 NOTE — Progress Notes (Signed)
1005 Urine protein test negative.

## 2011-12-10 NOTE — Assessment & Plan Note (Signed)
Mr. Gladu is a 78 year old patient of Dr. Arbutus Ped and Dr. Mitzi Hansen diagnosed with metastatic colon cancer.  MEDICAL HISTORY INCLUDES:  Hypertension and anemia.  MEDICATIONS INCLUDE:  Vitamin D, chromium, copper, Lasix, iodine, magnesium, manganese, multivitamin, niacin, Zofran, Ambien, zinc, vitamin K, vitamin E, vitamin B12, vitamin A.  LABS:  Include glucose of 113, albumin of 2.7, June 19th.  HEIGHT:  5 feet 11 inches. WEIGHT:  155.2 pounds documented June 19th. USUAL BODY WEIGHT:  Approximately 160 pounds. BMI:  21.66  The patient was identified as a positive nutrition risk screen secondary to decreased oral intake and poor appetite.  I met with him and his wife today in chemotherapy.  He reports that he is doing his best to maintain his weight.  He is drinking 3-4 boost Plus a day.  He does have a wound on his bottom that he is receiving treatment for and that he has had difficulty healing.  He is able to verbalize high-protein foods and he understands that he should be trying to include more protein foods in his daily intake.  His best meal is breakfast.  NUTRITION DIAGNOSIS:  Unintended weight loss related to diagnosis of metastatic lung cancer and associated treatments as evidenced by a 5-pound weight loss most recently secondary to poor appetite and poor oral intake.  INTERVENTION:  I educated Mr. Canterbury and his wife on small frequent meals with higher-calorie, higher-protein foods throughout the day.  I have given him suggestions on how to incorporate more protein into meals and snacks.  I have provided him with fact sheets on the importance of dealing with poor appetite.  I provided him some boost coupons to take with him today and encouraged Him to continue Boost Plus q.i.d. to support weight maintenance.  I have provided my contact information.  MONITORING/EVALUATION (GOALS):  The patient will tolerate increased calories and protein to promote weight maintenance and promote  healing.  NEXT VISIT:  The patient has my contact information for questions or concerns.    ______________________________ Zenovia Jarred, RD, LDN Clinical Nutrition Specialist BN/MEDQ  D:  12/10/2011  T:  12/10/2011  Job:  [phone]

## 2011-12-11 ENCOUNTER — Telehealth: Payer: Self-pay | Admitting: *Deleted

## 2011-12-11 DIAGNOSIS — C189 Malignant neoplasm of colon, unspecified: Secondary | ICD-10-CM

## 2011-12-11 NOTE — Telephone Encounter (Signed)
Ordered BMET for tomorrow-recheck Na++. Message left on pt phone with  lab appointment time.

## 2011-12-11 NOTE — Telephone Encounter (Signed)
Message left at pt's home to call back re: chemo follow-up from [facility] yest.
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
## 2011-12-12 ENCOUNTER — Ambulatory Visit (HOSPITAL_BASED_OUTPATIENT_CLINIC_OR_DEPARTMENT_OTHER): Payer: Medicare Other

## 2011-12-12 ENCOUNTER — Other Ambulatory Visit: Payer: Medicare Other | Admitting: Lab

## 2011-12-12 VITALS — BP 117/70 | HR 85 | Temp 96.8°F

## 2011-12-12 DIAGNOSIS — C189 Malignant neoplasm of colon, unspecified: Secondary | ICD-10-CM

## 2011-12-12 DIAGNOSIS — C7949 Secondary malignant neoplasm of other parts of nervous system: Secondary | ICD-10-CM

## 2011-12-12 DIAGNOSIS — C787 Secondary malignant neoplasm of liver and intrahepatic bile duct: Secondary | ICD-10-CM

## 2011-12-12 LAB — BASIC METABOLIC PANEL
BUN: 24 mg/dL — ABNORMAL HIGH (ref 6–23)
CO2: 28 mEq/L (ref 19–32)
Chloride: 98 mEq/L (ref 96–112)
Glucose, Bld: 121 mg/dL — ABNORMAL HIGH (ref 70–99)
Potassium: 4.4 mEq/L (ref 3.5–5.3)
Sodium: 134 mEq/L — ABNORMAL LOW (ref 135–145)

## 2011-12-12 MED ORDER — HEPARIN SOD (PORK) LOCK FLUSH 100 UNIT/ML IV SOLN
500.0000 [IU] | Freq: Once | INTRAVENOUS | Status: AC | PRN
  Administered 2011-12-12: 500 [IU]
  Filled 2011-12-12: qty 5

## 2011-12-12 MED ORDER — SODIUM CHLORIDE 0.9 % IJ SOLN
10.0000 mL | INTRAMUSCULAR | Status: DC | PRN
  Administered 2011-12-12: 10 mL
  Filled 2011-12-12: qty 10

## 2011-12-12 NOTE — Progress Notes (Signed)
Patient arrived to flush room with drug leaking thru the dressing onto chest. Note: no more than  A ml of drug had leaked. .  Take down of dressing found the needle out of the port.  Skin cleansed with soap and water and disposable shirt put on patient. Patient stated that he felt a tug when he got in the car to come to [facility].  234 of 241 ml of drug infused.  Wife confused as when to come today.  Reviewed with patient and wife the 46hr treatment time and patient and wife instructed how to take the batteries out of the pump.  Both verbalized the instructions above and demonstrated the battery removal.  Regarding the oral problem of 12/11/11 Patient is using the biotene x 3 a day and oral cavity is much improved.

## 2011-12-12 NOTE — Patient Instructions (Signed)
Call MD for problems and  Always call the [facility] during open hours  if there is a problem of the pump leaking or otherwise not functioning properly.  After hours use th Telephone # on the pump.

## 2011-12-13 ENCOUNTER — Telehealth: Payer: Self-pay | Admitting: *Deleted

## 2011-12-13 ENCOUNTER — Telehealth: Payer: Self-pay | Admitting: Medical Oncology

## 2011-12-13 DIAGNOSIS — K1230 Oral mucositis (ulcerative), unspecified: Secondary | ICD-10-CM

## 2011-12-13 MED ORDER — MAGIC MOUTHWASH: 5.0000 mL | Freq: Four times a day (QID) | ORAL | Status: DC | PRN

## 2011-12-13 NOTE — Telephone Encounter (Signed)
Per staff message I have scheduled appt. JMW 

## 2011-12-13 NOTE — Telephone Encounter (Signed)
Wife reports 2 mouth sores and requests magic mouthwash . . {t also constipated -no bm since colonoscopy. I called in rx for magic mouthwahs and told her to call Dr Arlyce Dice re consitpation.

## 2011-12-15 ENCOUNTER — Other Ambulatory Visit: Payer: Self-pay | Admitting: Hematology & Oncology

## 2011-12-15 DIAGNOSIS — K1231 Oral mucositis (ulcerative) due to antineoplastic therapy: Secondary | ICD-10-CM

## 2011-12-15 MED ORDER — LIDOCAINE VISCOUS 2 % MT SOLN: 20.0000 mL | OROMUCOSAL | Status: AC | PRN

## 2011-12-17 ENCOUNTER — Ambulatory Visit: Payer: Medicare Other

## 2011-12-18 ENCOUNTER — Telehealth: Payer: Self-pay | Admitting: Internal Medicine

## 2011-12-18 ENCOUNTER — Other Ambulatory Visit: Payer: Self-pay | Admitting: Internal Medicine

## 2011-12-18 ENCOUNTER — Encounter: Payer: Self-pay | Admitting: *Deleted

## 2011-12-18 ENCOUNTER — Other Ambulatory Visit: Payer: Self-pay | Admitting: *Deleted

## 2011-12-18 DIAGNOSIS — C189 Malignant neoplasm of colon, unspecified: Secondary | ICD-10-CM

## 2011-12-18 MED ORDER — HYDROCODONE-ACETAMINOPHEN 7.5-500 MG/15ML PO SOLN: 15.0000 mL | Freq: Four times a day (QID) | ORAL | Status: AC | PRN

## 2011-12-18 NOTE — Progress Notes (Signed)
Pt's wife called stating that pt's mouth is very sore and is completely red.  He is having trouble swallowing and eating and when he brushes his teeth his gums start to bleed.  Pt is taking magic mouthwash, lidocaine liquid and is doing baking soda with warm water with minimal relief.  Per Dr Donnald Garre, okay to give pt Lortab liquid.  Pt's wife aware rx called in and is to call if if does not get any better.  She stated she does not feel he is dehydrated.   She also stated that he had a slight nosebleed earlier but it has stopped.  Asked her to keep watching it and try some saline solution if his nose is dry but to call if it persists or gets worse.  SLJ

## 2011-12-18 NOTE — Telephone Encounter (Signed)
Spoke with daughter about drug being denied, Lanora Manis actually had taken care of this for patient let daughter know that.

## 2011-12-23 ENCOUNTER — Other Ambulatory Visit: Payer: Self-pay | Admitting: Oncology

## 2011-12-24 ENCOUNTER — Ambulatory Visit (HOSPITAL_BASED_OUTPATIENT_CLINIC_OR_DEPARTMENT_OTHER): Payer: Medicare Other | Admitting: Physician Assistant

## 2011-12-24 ENCOUNTER — Ambulatory Visit (HOSPITAL_BASED_OUTPATIENT_CLINIC_OR_DEPARTMENT_OTHER): Payer: Medicare Other

## 2011-12-24 ENCOUNTER — Other Ambulatory Visit (HOSPITAL_BASED_OUTPATIENT_CLINIC_OR_DEPARTMENT_OTHER): Payer: Medicare Other

## 2011-12-24 ENCOUNTER — Telehealth: Payer: Self-pay | Admitting: Internal Medicine

## 2011-12-24 ENCOUNTER — Other Ambulatory Visit: Payer: Self-pay | Admitting: Medical Oncology

## 2011-12-24 VITALS — BP 102/56 | HR 76 | Temp 96.8°F | Ht 71.0 in | Wt 148.7 lb

## 2011-12-24 DIAGNOSIS — C189 Malignant neoplasm of colon, unspecified: Secondary | ICD-10-CM

## 2011-12-24 DIAGNOSIS — C3490 Malignant neoplasm of unspecified part of unspecified bronchus or lung: Secondary | ICD-10-CM

## 2011-12-24 DIAGNOSIS — C787 Secondary malignant neoplasm of liver and intrahepatic bile duct: Secondary | ICD-10-CM

## 2011-12-24 DIAGNOSIS — C7800 Secondary malignant neoplasm of unspecified lung: Secondary | ICD-10-CM

## 2011-12-24 DIAGNOSIS — C7949 Secondary malignant neoplasm of other parts of nervous system: Secondary | ICD-10-CM

## 2011-12-24 DIAGNOSIS — Z5111 Encounter for antineoplastic chemotherapy: Secondary | ICD-10-CM

## 2011-12-24 LAB — CBC WITH DIFFERENTIAL/PLATELET
Basophils Absolute: 0 10*3/uL (ref 0.0–0.1)
EOS%: 0.9 % (ref 0.0–7.0)
Eosinophils Absolute: 0.1 10*3/uL (ref 0.0–0.5)
HGB: 9 g/dL — ABNORMAL LOW (ref 13.0–17.1)
NEUT#: 7 10*3/uL — ABNORMAL HIGH (ref 1.5–6.5)
RBC: 4.07 10*6/uL — ABNORMAL LOW (ref 4.20–5.82)
RDW: 22.8 % — ABNORMAL HIGH (ref 11.0–14.6)
lymph#: 0.8 10*3/uL — ABNORMAL LOW (ref 0.9–3.3)
nRBC: 0 % (ref 0–0)

## 2011-12-24 LAB — COMPREHENSIVE METABOLIC PANEL
ALT: 35 U/L (ref 0–53)
Albumin: 2.7 g/dL — ABNORMAL LOW (ref 3.5–5.2)
Alkaline Phosphatase: 402 U/L — ABNORMAL HIGH (ref 39–117)
CO2: 29 mEq/L (ref 19–32)
Glucose, Bld: 93 mg/dL (ref 70–99)
Potassium: 4.7 mEq/L (ref 3.5–5.3)
Sodium: 136 mEq/L (ref 135–145)
Total Protein: 6 g/dL (ref 6.0–8.3)

## 2011-12-24 MED ORDER — LEUCOVORIN CALCIUM INJECTION 350 MG
400.0000 mg/m2 | Freq: Once | INTRAVENOUS | Status: AC
  Administered 2011-12-24: 760 mg via INTRAVENOUS
  Filled 2011-12-24: qty 38

## 2011-12-24 MED ORDER — FLUOROURACIL CHEMO INJECTION 2.5 GM/50ML
400.0000 mg/m2 | Freq: Once | INTRAVENOUS | Status: AC
  Administered 2011-12-24: 750 mg via INTRAVENOUS
  Filled 2011-12-24: qty 15

## 2011-12-24 MED ORDER — DEXTROSE 5 % IV SOLN
Freq: Once | INTRAVENOUS | Status: AC
  Administered 2011-12-24: 13:00:00 via INTRAVENOUS

## 2011-12-24 MED ORDER — DEXAMETHASONE SODIUM PHOSPHATE 10 MG/ML IJ SOLN
10.0000 mg | Freq: Once | INTRAMUSCULAR | Status: AC
  Administered 2011-12-24: 10 mg via INTRAVENOUS

## 2011-12-24 MED ORDER — OXALIPLATIN CHEMO INJECTION 100 MG/20ML
85.0000 mg/m2 | Freq: Once | INTRAVENOUS | Status: AC
  Administered 2011-12-24: 160 mg via INTRAVENOUS
  Filled 2011-12-24: qty 32

## 2011-12-24 MED ORDER — SODIUM CHLORIDE 0.9 % IV SOLN
Freq: Once | INTRAVENOUS | Status: AC
  Administered 2011-12-24: 11:00:00 via INTRAVENOUS

## 2011-12-24 MED ORDER — SODIUM CHLORIDE 0.9 % IV SOLN
2400.0000 mg/m2 | INTRAVENOUS | Status: DC
  Administered 2011-12-24: 4550 mg via INTRAVENOUS
  Filled 2011-12-24: qty 91

## 2011-12-24 MED ORDER — ONDANSETRON 8 MG/50ML IVPB (CHCC)
8.0000 mg | Freq: Once | INTRAVENOUS | Status: AC
  Administered 2011-12-24: 8 mg via INTRAVENOUS

## 2011-12-24 MED ORDER — SODIUM CHLORIDE 0.9 % IV SOLN
5.0000 mg/kg | Freq: Once | INTRAVENOUS | Status: AC
  Administered 2011-12-24: 350 mg via INTRAVENOUS
  Filled 2011-12-24: qty 14

## 2011-12-24 NOTE — Telephone Encounter (Signed)
appts made and printed for pt aom °

## 2011-12-24 NOTE — Patient Instructions (Addendum)
[facility] Discharge Instructions for Patients Receiving Chemotherapy  Today you received the following chemotherapy agents Avastin, Oxaliplatin, Leucovorin and Adrucil.  To help prevent nausea and vomiting after your treatment, we encourage you to take your nausea medication. Begin taking it as often as prescribed for by Dr. Arbutus Ped.    If you develop nausea and vomiting that is not controlled by your nausea medication, call the clinic. If it is after clinic hours your family physician or the after hours number for the clinic or go to the Emergency Department.   BELOW ARE SYMPTOMS THAT SHOULD BE REPORTED IMMEDIATELY:  *FEVER GREATER THAN 100.5 F  *CHILLS WITH OR WITHOUT FEVER  NAUSEA AND VOMITING THAT IS NOT CONTROLLED WITH YOUR NAUSEA MEDICATION  *UNUSUAL SHORTNESS OF BREATH  *UNUSUAL BRUISING OR BLEEDING  TENDERNESS IN MOUTH AND THROAT WITH OR WITHOUT PRESENCE OF ULCERS  *URINARY PROBLEMS  *BOWEL PROBLEMS  UNUSUAL RASH Items with * indicate a potential emergency and should be followed up as soon as possible.  One of the nurses will contact you 24 hours after your treatment. Please let the nurse know about any problems that you may have experienced. Feel free to call the clinic you have any questions or concerns. The clinic phone number is [phone].   I have been informed and understand all the instructions given to me. I know to contact the clinic, my physician, or go to the Emergency Department if any problems should occur. I do not have any questions at this time, but understand that I may call the clinic during office hours   should I have any questions or need assistance in obtaining follow up care.    __________________________________________  _____________  __________ Signature of Patient or Authorized Representative            Date                   Time    __________________________________________ Nurse's Signature

## 2011-12-25 NOTE — Progress Notes (Signed)
[facility] [phone]   [phone]  OFFICE PROGRESS NOTE  [name], FNP [address] [place] [address]  DIAGNOSIS: Metastatic colon adenocarcinoma (KRAS mutation) with metastatic lesion to the brain, lung, liver and bone diagnosed in May of 2013   PRIOR THERAPY: Status post stereotactic radiosurgery to 2 brain lesions  CURRENT THERAPY: Systemic chemotherapy with FOLFOX/Avastin status post 1 cycle  INTERVAL HISTORY: Joseph Rowland 78 y.o. male returns to the clinic today for followup visit accompanied by his wife. He tolerated the first cycle of chemotherapy relatively well with the exception of nausea that was unrelieved with Phenergan but was relieved with Zofran. He also continues to take 1 or 2 Zofran daily because of some continued nausea. He had significant blisters in the mouth that were unrelieved with Magic mouthwash but were relieved with Lortab elixir. He is eating a bit better but is ingesting a fair amount of boost nutritional supplements. His primary problem has been that of significant constipation. He finally get some relief after 3 fleets enemas and some magnesium citrate. The patient is doing fine today with no specific complaints except for mild fatigue.  He has no chest pain or shortness of breath. He denied fever or chills.  MEDICAL HISTORY: Past Medical History  Diagnosis Date  . Lung mass   . Liver cancer   . metastatic adenocarcinoma 11/14/11    liver bx=metastatic adenocarcino57ma with tumor necrosis kras  mutation and involving brain,lung,and bones  . Brain cancer     lesions in occipital lobes,b/l suspicious mets  . Lung cancer     b/l multifocal pulmonary mets  . Hypertension   . Anemia     ALLERGIES:   has no known allergies.  MEDICATIONS:  Current Outpatient Prescriptions  Medication Sig Dispense Refill  . albuterol (PROVENTIL) (5 MG/ML) 0.5% nebulizer solution Take 2.5 mg by  nebulization every 6 (six) hours as needed.      . Alum & Mag Hydroxide-Simeth (MAGIC MOUTHWASH) SOLN Take 5 mLs by mouth 4 (four) times daily as needed.  240 mL  0  . amoxicillin (AMOXIL) 875 MG tablet Take 875 mg by mouth 2 (two) times daily. For 20 days      . aspirin 81 MG tablet Take 81 mg by mouth daily.      . cholecalciferol (VITAMIN D) 1000 UNITS tablet Take 1,000 Units by mouth 2 (two) times daily.      . Chromium 200 MCG CAPS Take 120 mcg by mouth daily.      . Copper Gluconate (COPPER CAPS PO) Take 2 mg by mouth daily.      Marland Kitchen FeFum-FePoly-FA-B Cmp-C-Biot (INTEGRA PLUS) CAPS Take 1 capsule by mouth every morning.  30 capsule  2  . furosemide (LASIX) 20 MG tablet       . HYDROcodone-acetaminophen (LORTAB) 7.5-500 MG/15ML solution Take 15 mLs by mouth every 6 (six) hours as needed for pain.  480 mL  0  . IODINE, KELP, PO Take 150 mg by mouth daily.      Marland Kitchen lidocaine (XYLOCAINE) 2 % solution Take 20 mLs by mouth every 4 (four) hours as needed for pain.  500 mL  4  . lidocaine-prilocaine (EMLA) cream       . Magnesium 100 MG CAPS Take 100 mg by mouth daily.      Marland Kitchen MANGANESE PO Take 2 mg by mouth daily.      . Multiple Vitamin (MULITIVITAMIN) LIQD Take  10 mLs by mouth daily.      . niacin 50 MG tablet Take 50 mg by mouth daily.      . ondansetron (ZOFRAN) 4 MG tablet Take 1 tablet (4 mg total) by mouth every 8 (eight) hours as needed for nausea.  60 tablet  3  . peg 3350 powder (MOVIPREP) 100 G SOLR Take 1 kit (100 g total) by mouth once.  1 kit  0  . potassium chloride (K-DUR) 10 MEQ tablet Take 10 mEq by mouth daily.      . potassium chloride (K-DUR,KLOR-CON) 10 MEQ tablet       . PROBIOTIC CAPS Take 6 capsules by mouth daily.      . Promethazine HCl (PHENERGAN PO) Take by mouth as directed.      . pyridOXINE (VITAMIN B-6) 50 MG tablet Take 50 mg by mouth daily.      . Riboflavin (VITAMIN B-2 PO) Take 50 mg by mouth daily.      Marland Kitchen selenium 50 MCG TABS Take 100 mcg by mouth daily.        Marland Kitchen thiamine (VITAMIN B-1) 50 MG tablet Take 50 mg by mouth daily.      Marland Kitchen UNABLE TO FIND 50 mg. Pantothenic      . vitamin A 16109 UNIT capsule Take 10,000 Units by mouth daily.      . vitamin B-12 (CYANOCOBALAMIN) 500 MCG tablet Take 2,500 mcg by mouth daily.      . vitamin E 100 UNIT capsule Take 100 Units by mouth daily.      . vitamin k 100 MCG tablet Take 80 mcg by mouth daily.      . Zinc Sulfate (ZINC 15 PO) Take 10 mg by mouth daily.      Marland Kitchen zolpidem (AMBIEN) 10 MG tablet Take 1 tablet (10 mg total) by mouth at bedtime as needed for sleep.  30 tablet  0   No current facility-administered medications for this visit.   Facility-Administered Medications Ordered in Other Visits  Medication Dose Route Frequency Provider Last Rate Last Dose  . dextrose 5 % solution   Intravenous Once Conni Slipper, PA      . fluorouracil (ADRUCIL) chemo injection 750 mg  400 mg/m2 (Treatment Plan Actual) Intravenous Once Conni Slipper, PA   750 mg at 12/24/11 1531  . DISCONTD: fluorouracil (ADRUCIL) 4,550 mg in sodium chloride 0.9 % 150 mL chemo infusion  2,400 mg/m2 (Treatment Plan Actual) Intravenous 1 day or 1 dose Conni Slipper, PA   4,550 mg at 12/24/11 1535    SURGICAL HISTORY:  Past Surgical History  Procedure Date  . Hernia repair   . Right port a cath     power port right subclavian  . Colonoscopy 12/06/2011    Procedure: COLONOSCOPY;  Surgeon: Louis Meckel, MD;  Location: [facility];  Service: Endoscopy;  Laterality: N/A;  . Colonoscopy 12/06/2011    Procedure: COLONOSCOPY;  Surgeon: Louis Meckel, MD;  Location: [facility];  Service: Endoscopy;  Laterality: N/A;    REVIEW OF SYSTEMS:  A comprehensive review of systems was negative except for: Ears, nose, mouth, throat, and face: positive for sore mouth Gastrointestinal: positive for constipation and nausea   PHYSICAL EXAMINATION: General appearance: alert, cooperative, fatigued and no distress Head: Normocephalic, without  obvious abnormality, atraumatic Neck: no adenopathy Lymph nodes: Cervical, supraclavicular, and axillary nodes normal. Resp: clear to auscultation bilaterally Cardio: regular rate and rhythm, S1, S2 normal, no murmur, click,  rub or gallop GI: soft, non-tender; bowel sounds normal; no masses,  no organomegaly Extremities: extremities normal, atraumatic, no cyanosis or edema Neurologic: Alert and oriented X 3, normal strength and tone. Normal symmetric reflexes. Normal coordination and gait  ECOG PERFORMANCE STATUS: 1 - Symptomatic but completely ambulatory  Blood pressure 102/56, pulse 76, temperature 96.8 F (36 C), temperature source Oral, height 5\' 11"  (1.803 m), weight 148 lb 11.2 oz (67.45 kg).  LABORATORY DATA: Lab Results  Component Value Date   WBC 8.7 12/24/2011   HGB 9.0* 12/24/2011   HCT 29.9* 12/24/2011   MCV 73.5* 12/24/2011   PLT 386 12/24/2011      Chemistry      Component Value Date/Time   NA 136 12/24/2011 0927   K 4.7 12/24/2011 0927   CL 101 12/24/2011 0927   CO2 29 12/24/2011 0927   BUN 20 12/24/2011 0927   CREATININE 0.72 12/24/2011 0927      Component Value Date/Time   CALCIUM 8.6 12/24/2011 0927   ALKPHOS 402* 12/24/2011 0927   AST 51* 12/24/2011 0927   ALT 35 12/24/2011 0927   BILITOT 0.6 12/24/2011 0927       RADIOGRAPHIC STUDIES: Mr [name] Contrast  2011/12/25  *RADIOLOGY REPORT*  Clinical Data: Metastatic disease.  Liver masses, right colon mass, lung mass, right para pharyngeal mass.  MRI HEAD WITHOUT AND WITH CONTRAST  Technique:  Multiplanar, multiecho pulse sequences of the brain and surrounding structures were obtained according to standard protocol without and with intravenous contrast  Contrast: 15mL MULTIHANCE GADOBENATE DIMEGLUMINE 529 MG/ML IV SOLN  Comparison: MRI 11/21/2011  Findings: Stereotactic radiosurgery protocol performed at 3 Tesla with thin section high resolution imaging.  Right para pharyngeal mass measures approximately  2 cm and appears smaller  compared with the prior study.  This is not well evaluated on this study of the brain.  This was positive on PET scan may represent a primary or secondary malignancy or possibly an area of infection.  2 x 5 mm enhancing mass in the right lower occipital lobe with restricted diffusion is similar to the prior MRI.  This is most likely due to metastatic disease.  There is a possible second enhancing nodule in the right occipital cortex on axial image number 80.  I would favor this is metastatic disease rather than vascular enhancement however it is possibly a vessel.  2 mm enhancing nodule left occipital lobe axial image number 82, compatible with metastatic disease.  No other enhancing lesions however given the small size of lesions, other small lesions could be present and not seen by this study.  Ventricle size is normal.  Age appropriate atrophy.  Mild chronic microvascular ischemia in the white matter.  Degenerative changes at C1-C2 with significant thickening of the transverse ligament.  No deformity of the cord. Right maxillary sinusitis with extensive mucosal thickening and air fluid level due to retained secretions.  IMPRESSION: Small enhancing lesions in the occipital lobes bilaterally, suspicious for metastatic disease.  Right para pharyngeal soft tissue mass is not as well seen as on the prior study but  remains suspicious for tumor particularly given the increased uptake on PET scan.  This may be a tonsillar carcinoma.  Original Report Authenticated By: Camelia Phenes, M.D.   Mr Laqueta Jean Wo Contrast  11/21/2011  *RADIOLOGY REPORT*  Clinical Data: Evaluate for intracranial metastatic disease of unknown primary with widespread liver mets. Large parapharyngeal mass in the right neck as well as suspected  cecal mass.  Question colon cancer, question head neck squamous cell carcinoma.  MRI HEAD WITHOUT AND WITH CONTRAST  Technique:  Multiplanar, multiecho pulse sequences of the brain and surrounding structures  were obtained according to standard protocol without and with intravenous contrast  Contrast: 15mL MULTIHANCE GADOBENATE DIMEGLUMINE 529 MG/ML IV SOLN  Comparison: PET scan 11/21/2011.  CT chest 11/02/2011.  Findings: There are foci of restricted diffusion in the right and left occipital lobes at the gray-white junction, both measuring approximately 3 mm in diameter felt to represent metastatic disease rather than acute infarction.  Post contrast enhancement is confirmed of the right occipital lesion (image 20 series 13), but less well visualized  than left occipital lesion (image 14, series 15).  Possible third lesion right occipital cortex  (image 28 series 13), not associated with restricted diffusion.  No definite brainstem or cerebellar lesions.  No hemorrhage, hydrocephalus, or extra-axial fluid.  Moderate atrophy is present with chronic microvascular ischemic change.  No foci of chronic hemorrhage.  Negative pituitary and cerebellar tonsils.  Moderate pannus.  In the right parapharyngeal region, there is a 22 x 28 x 19 mm necrotic mass representing either a squamous cell carcinoma primary or a conglomerate mass of  lymph nodes.  Consider tissue sampling for further evaluation.  IMPRESSION: Suspected two and possibly three intracranial metastatic deposits. 3T MRI is more sensitive in the detection of multiple intracranial space-occupying lesions, and should be considered if the patient is a candidate for stereotactic radiosurgery.  22 x 28 x 19 mm right parapharyngeal space mass, incompletely evaluated, could represent a nodal mass or a head/neck primary. Correlate clinically. Recommend CT neck with contrast for further evaluation with possible tissue sampling.  Atrophy with chronic microvascular ischemic change.  Original Report Authenticated By: Elsie Stain, M.D.   Nm Pet Image Initial (pi) Skull Base To Thigh  11/21/2011  *RADIOLOGY REPORT*  Clinical Data: Initial treatment strategy for liver cancer  with lung metastasis.  NUCLEAR MEDICINE PET SKULL BASE TO THIGH  Fasting Blood Glucose:  107  Technique:  16.1 mCi F-18 FDG was injected intravenously. CT data was obtained and used for attenuation correction and anatomic localization only.  (This was not acquired as a diagnostic CT examination.) Additional exam technical data entered on technologist worksheet.  Comparison:  11/02/2011  Findings:  Neck: Asymmetric opacification of the right maxillary sinus is identified.  There is increased uptake within this right maxillary sinus which is nonspecific and may be due to inflammation.  Within the right parapharyngeal region/tonsillar pillar there is a hypermetabolic mass which measures 2.3 cm and has an SUV max equal to 15.8, image 30.  Chest:  No hypermetabolic supraclavicular or axillary lymph nodes. Multiple small mediastinal and hilar lymph nodes are identified which exhibit mild increased FDG uptake.  Low right paratracheal lymph node measures 10.3 mm and has an SUV max equal to 3.7, image 90.  There are bilateral pleural effusions present,  right greater than left.  Extensive, bilateral multifocal hypermetabolic pulmonary nodules are identified.  Index lesion within the posterior left lower lobe measures 2.1 cm and has an SUV max equal to 3.6, image 111.  Right lower lobe lesion measures 3.2 cm and has an SUV max equal to 7.9, image 123.  Abdomen/Pelvis:  Extensive multifocal liver metastasis are identified.  For reference purposes. lesion within the medial segment of the left hepatic lobe measures 3.6 cm and has an SUV max equal to 11.9, image 159.  Posterior right hepatic  lobe lesion measures 4.3 cm and has an SUV max equal to 10.6, image 164.  The adrenal glands appear normal.  The pancreas is unremarkable. Normal appearance of the spleen.  Cysts within the left kidney are incompletely characterized without IV contrast.  The right kidney appears normal.  There is a large cecal mass present.  This mass  measures approximately 7.5 x 5.4 cm and has an SUV max equal to 23.4.  There are multiple borderline enlarged, hypermetabolic ileocolic lymph nodes.  [place]:  There is abnormal FDG uptake identified within the L5 vertebral body.  Suspicious for bone metastasis.  The SUV max is equal to 5.6, image number 196.  IMPRESSION:  1.  Large mass within the cecum is concerning for primary colonic neoplasm.  There are several ileocolic lymph nodes which are increased in size and the which exhibit increased FDG uptake are suspicious for local/regional lymph node metastasis. 2.  Extensive liver metastasis and lung metastasis. 3.  Borderline mediastinal and hilar lymph nodes.  Cannot rule out lymph node metastasis. 4.  Suspect bone metastasis to the L5 vertebral body. 5.  Large and intensely hypermetabolic lesion within the right posterior parapharyngeal region.  This may represent an area of metastasis.  Primary head neck carcinoma is not excluded.  Original Report Authenticated By: Rosealee Albee, M.D.   US Biopsy  11/14/2011  *RADIOLOGY REPORT*  Clinical data:    hepatic and pulmonary metastatic disease, unknown primary.  ULTRASOUND-GUIDED CORE LIVER LESION BIOPSY  Technique and findings: The procedure, risks (including but not limited to bleeding, infection, organ damage), benefits, and alternatives were explained to the patient.  Questions regarding the procedure were encouraged and answered.  The patient understands and consents to the procedure.Survey ultrasound of the liver was performed and an appropriate representative lesion was localized.  An appropriate skin entry site was determined. Operator donned sterile gloves and mask.   Site was marked, prepped with Betadine, draped in usual sterile fashion, infiltrated locally with 1% lidocaine.  Intravenous Fentanyl and Versed were administered as conscious sedation during continuous cardiorespiratory monitoring by the radiology RN, with a total moderate sedation time  of 20 minutes.  Under real time ultrasound guidance, a 17 gauge trocar needle was advanced to the margin of the lesion.  Once needle tip position was confirmed, coaxial 18-gauge core biopsy samples were obtained.  The guide needle was removed.  Postprocedure scans show no hematoma or other apparent complication. The patient tolerated the procedure well.  IMPRESSION: 1.  Technically successful ultrasound-guided core liver lesion biopsy.  Original Report Authenticated By: Osa Craver, M.D.   Ir Fluoro Guide Cv Line Right  11/27/2011  *RADIOLOGY REPORT*  Clinical Data: Metastatic colon cancer.  The patient presents for Port-A-Cath placement to begin chemotherapy.  IMPLANTED PORT A CATH PLACEMENT WITH ULTRASOUND AND FLUOROSCOPIC GUIDANCE  Sedation:  2.0 mg IV Versed; 100 mcg IV Fentanyl.  Total Moderate Sedation Time:  45 minutes.  Additional Medications:  1 gram IV Ancef.  As antibiotic prophylaxis, Ancef 1 gm was ordered pre-procedure and administered intravenously within one hour of incision.  Fluoroscopy Time:  0.5 minutes.  Procedure:  The procedure, risks, benefits, and alternatives were explained to the patient.  Questions regarding the procedure were encouraged and answered.  The patient understands and consents to the procedure.  The right neck and chest were prepped with chlorhexidine in a sterile fashion, and a sterile drape was applied covering the operative field.  Maximum barrier sterile technique with  sterile gowns and gloves were used for the procedure.  Local anesthesia was provided with 1% lidocaine and lidocaine with epinephrine.  After creating a small venotomy incision, a 21 gauge needle was advanced into the right internal jugular vein under direct, real- time ultrasound guidance.  Ultrasound image documentation was performed.  After securing guidewire access, an 8 Fr dilator was placed.  A J-wire was kinked to measure appropriate catheter length.  A subcutaneous port pocket was then  created along the upper chest wall utilizing sharp and blunt dissection.  Portable cautery was utilized.  The pocket was irrigated with sterile saline.  A single lumen power injectable port was chosen for placement.  The 8 Fr catheter was tunneled from the port pocket site to the venotomy incision.  The port was placed in the pocket and secured with two Ethilon tacking sutures.  External catheter was trimmed to appropriate length based on guidewire measurement.  At the venotomy, an 8 Fr peel-away sheath was placed over a guidewire.  The catheter was then placed through the sheath and the sheath removed.  Final catheter positioning was confirmed and documented with a fluoroscopic spot image.  The port was accessed with a needle and aspirated and flushed with heparinized saline. The needle was removed.  The venotomy and port pocket incisions were closed with subcutaneous 3-0 Monocryl and subcuticular 4-0 Vicryl.  Dermabond was applied to both incisions.  Complications: None.  No pneumothorax.  Findings:  After catheter placement, the tip lies at the cavoatrial junction.  The catheter aspirates normally and is ready for immediate use.  IMPRESSION:  Placement of single lumen port a cath via right internal jugular vein.  The catheter tip lies at the cavoatrial junction.  A power injectable port a cath was placed and is ready for immediate use.  Original Report Authenticated By: Reola Calkins, M.D.   Ir US Guide Vasc Access Right  11/27/2011  *RADIOLOGY REPORT*  Clinical Data: Metastatic colon cancer.  The patient presents for Port-A-Cath placement to begin chemotherapy.  IMPLANTED PORT A CATH PLACEMENT WITH ULTRASOUND AND FLUOROSCOPIC GUIDANCE  Sedation:  2.0 mg IV Versed; 100 mcg IV Fentanyl.  Total Moderate Sedation Time:  45 minutes.  Additional Medications:  1 gram IV Ancef.  As antibiotic prophylaxis, Ancef 1 gm was ordered pre-procedure and administered intravenously within one hour of incision.  Fluoroscopy  Time:  0.5 minutes.  Procedure:  The procedure, risks, benefits, and alternatives were explained to the patient.  Questions regarding the procedure were encouraged and answered.  The patient understands and consents to the procedure.  The right neck and chest were prepped with chlorhexidine in a sterile fashion, and a sterile drape was applied covering the operative field.  Maximum barrier sterile technique with sterile gowns and gloves were used for the procedure.  Local anesthesia was provided with 1% lidocaine and lidocaine with epinephrine.  After creating a small venotomy incision, a 21 gauge needle was advanced into the right internal jugular vein under direct, real- time ultrasound guidance.  Ultrasound image documentation was performed.  After securing guidewire access, an 8 Fr dilator was placed.  A J-wire was kinked to measure appropriate catheter length.  A subcutaneous port pocket was then created along the upper chest wall utilizing sharp and blunt dissection.  Portable cautery was utilized.  The pocket was irrigated with sterile saline.  A single lumen power injectable port was chosen for placement.  The 8 Fr catheter was tunneled from the port pocket  site to the venotomy incision.  The port was placed in the pocket and secured with two Ethilon tacking sutures.  External catheter was trimmed to appropriate length based on guidewire measurement.  At the venotomy, an 8 Fr peel-away sheath was placed over a guidewire.  The catheter was then placed through the sheath and the sheath removed.  Final catheter positioning was confirmed and documented with a fluoroscopic spot image.  The port was accessed with a needle and aspirated and flushed with heparinized saline. The needle was removed.  The venotomy and port pocket incisions were closed with subcutaneous 3-0 Monocryl and subcuticular 4-0 Vicryl.  Dermabond was applied to both incisions.  Complications: None.  No pneumothorax.  Findings:  After catheter  placement, the tip lies at the cavoatrial junction.  The catheter aspirates normally and is ready for immediate use.  IMPRESSION:  Placement of single lumen port a cath via right internal jugular vein.  The catheter tip lies at the cavoatrial junction.  A power injectable port a cath was placed and is ready for immediate use.  Original Report Authenticated By: Reola Calkins, M.D.    ASSESSMENT/PLAN: This is a very pleasant 78 years old white male with metastatic colon adenocarcinoma with extensive liver metastases as well as brain and lung metastases. He is status post stereotactic radiosurgery to 2 brain lesions. Patient is also status post 1 cycle of systemic chemotherapy with FOLFOX and Avastin. Patient was discussed with Dr. Arbutus Ped. He'll proceed with his second cycle of systemic chemotherapy with FOLFOX and Avastin. For his constipation we'll have him start taking MiraLAX twice daily and add Senokot S,1 to 2 tablets twice daily as needed. He can titrate both the MiraLAX and Senokot S. as needed given the consistency of his stool. He was encouraged to eat items such as prunes and increase his fluid intake. He'll followup with Dr. Arbutus Ped in 2 weeks with repeat CBC differential and C. met prior to his next cycle of chemotherapy.  Laural Benes, Aurelius Gildersleeve E, PA-C   All questions were answered. The patient knows to call the clinic with any problems, questions or concerns. We can certainly see the patient much sooner if necessary.  I spent 20 minutes counseling the patient face to face. The total time spent in the appointment was 30 minutes.

## 2011-12-26 ENCOUNTER — Ambulatory Visit (HOSPITAL_BASED_OUTPATIENT_CLINIC_OR_DEPARTMENT_OTHER): Payer: Medicare Other

## 2011-12-26 VITALS — BP 114/74 | HR 69 | Temp 97.7°F

## 2011-12-26 DIAGNOSIS — C189 Malignant neoplasm of colon, unspecified: Secondary | ICD-10-CM

## 2011-12-26 MED ORDER — SODIUM CHLORIDE 0.9 % IJ SOLN
10.0000 mL | INTRAMUSCULAR | Status: DC | PRN
  Administered 2011-12-26: 10 mL
  Filled 2011-12-26: qty 10

## 2011-12-26 MED ORDER — HEPARIN SOD (PORK) LOCK FLUSH 100 UNIT/ML IV SOLN
500.0000 [IU] | Freq: Once | INTRAVENOUS | Status: AC | PRN
  Administered 2011-12-26: 500 [IU]
  Filled 2011-12-26: qty 5

## 2011-12-26 NOTE — Patient Instructions (Signed)
Call MD for problems 

## 2012-01-06 NOTE — Progress Notes (Addendum)
  Radiation Oncology         [phone] ________________________________  Name: Joseph Rowland MRN: [id number]  Date: 12/07/2011  DOB: 1934-02-06  End of Treatment Note  Diagnosis:   Metastatic colon cancer     Indication for treatment:  Palliative       Radiation treatment dates:   The patient received a single fraction of radiosurgery on 12/07/2011 to 2 intracranial lesions.  Site/dose:    1. right occipital lobe, 5 mm: The lesion was treated with 4 dynamic conformal arcs. The lesion was treated with 20 gray in 1 fraction. 2. left occipital lobe, 2 mm: The lesion was treated with 5 arcs using a 6 mm cone. The lesion was treated with 20 gray in 1 fraction.  Narrative: The patient tolerated radiation treatment well.  No difficulties with delivery of the planned radiation treatment. He was observed posttreatment and did not have any complaints. Vital signs were stable.  Plan: The patient has completed radiation treatment. The patient will return to radiation oncology clinic for routine followup in one month. I advised the patient to call or return sooner if they have any questions or concerns related to their recovery or treatment. ________________________________  Radene Gunning, M.D., Ph.D.

## 2012-01-06 NOTE — Addendum Note (Signed)
Encounter addended by: Jonna Coup, MD on: 01/06/2012  1:13 AM<BR>     Documentation filed: Notes Section

## 2012-01-06 NOTE — Progress Notes (Signed)
  Radiation Oncology         [phone] ________________________________  Name: Joseph Rowland MRN: [id number]  Date: 12/05/2011  DOB: Nov 03, 1933  SIMULATION AND TREATMENT PLANNING NOTE  DIAGNOSIS:  Metastatic colon cancer  NARRATIVE:  The patient was brought to the CT Simulation planning suite.  Identity was confirmed.  All relevant records and images related to the planned course of therapy were reviewed.  The patient freely provided informed written consent to proceed with treatment after reviewing the details related to the planned course of therapy. The consent form was witnessed and verified by the simulation staff. Intravenous access was established for contrast administration. Then, the patient was set-up in a stable reproducible supine position for radiation therapy.  A relocatable thermoplastic stereotactic head frame was fabricated for precise immobilization.  CT images were obtained.  Surface markings were placed.  The CT images were loaded into the planning software and fused with the patient's targeting MRI scan.  Then the target and avoidance structures were contoured.  Treatment planning then occurred.  The radiation prescription was entered and confirmed.  I have requested 3D planning.  I have requested a DVH of the following structures: Brain stem, brain, left eye, right I, lenses, optic chiasm, target volumes, uninvolved brain, and normal tissue.    PLAN:  The patient has small intracranial metastases, both within the left occipital lobe and the right occipital lobe. The patient will receive 20 Gy in one fraction to both lesions.  Dorothy Puffer, M.D.

## 2012-01-07 ENCOUNTER — Telehealth: Payer: Self-pay | Admitting: Medical Oncology

## 2012-01-07 ENCOUNTER — Ambulatory Visit (HOSPITAL_BASED_OUTPATIENT_CLINIC_OR_DEPARTMENT_OTHER): Payer: Medicare Other | Admitting: Internal Medicine

## 2012-01-07 ENCOUNTER — Ambulatory Visit (HOSPITAL_BASED_OUTPATIENT_CLINIC_OR_DEPARTMENT_OTHER): Payer: Medicare Other

## 2012-01-07 ENCOUNTER — Telehealth: Payer: Self-pay | Admitting: *Deleted

## 2012-01-07 ENCOUNTER — Other Ambulatory Visit (HOSPITAL_BASED_OUTPATIENT_CLINIC_OR_DEPARTMENT_OTHER): Payer: Medicare Other | Admitting: Lab

## 2012-01-07 VITALS — BP 117/61 | HR 86 | Temp 97.0°F | Ht 71.0 in | Wt 150.6 lb

## 2012-01-07 DIAGNOSIS — Z5112 Encounter for antineoplastic immunotherapy: Secondary | ICD-10-CM

## 2012-01-07 DIAGNOSIS — C7800 Secondary malignant neoplasm of unspecified lung: Secondary | ICD-10-CM

## 2012-01-07 DIAGNOSIS — C7949 Secondary malignant neoplasm of other parts of nervous system: Secondary | ICD-10-CM

## 2012-01-07 DIAGNOSIS — C7931 Secondary malignant neoplasm of brain: Secondary | ICD-10-CM

## 2012-01-07 DIAGNOSIS — C180 Malignant neoplasm of cecum: Secondary | ICD-10-CM

## 2012-01-07 DIAGNOSIS — C189 Malignant neoplasm of colon, unspecified: Secondary | ICD-10-CM

## 2012-01-07 DIAGNOSIS — Z5111 Encounter for antineoplastic chemotherapy: Secondary | ICD-10-CM

## 2012-01-07 DIAGNOSIS — C187 Malignant neoplasm of sigmoid colon: Secondary | ICD-10-CM

## 2012-01-07 DIAGNOSIS — Z9221 Personal history of antineoplastic chemotherapy: Secondary | ICD-10-CM

## 2012-01-07 DIAGNOSIS — C787 Secondary malignant neoplasm of liver and intrahepatic bile duct: Secondary | ICD-10-CM

## 2012-01-07 HISTORY — DX: Personal history of antineoplastic chemotherapy: Z92.21

## 2012-01-07 LAB — CBC WITH DIFFERENTIAL/PLATELET
BASO%: 0.6 % (ref 0.0–2.0)
EOS%: 1 % (ref 0.0–7.0)
HCT: 31.3 % — ABNORMAL LOW (ref 38.4–49.9)
LYMPH%: 14.6 % (ref 14.0–49.0)
MCH: 22.4 pg — ABNORMAL LOW (ref 27.2–33.4)
MCHC: 30 g/dL — ABNORMAL LOW (ref 32.0–36.0)
MONO%: 11.8 % (ref 0.0–14.0)
NEUT%: 72 % (ref 39.0–75.0)
Platelets: 248 10*3/uL (ref 140–400)
lymph#: 0.9 10*3/uL (ref 0.9–3.3)

## 2012-01-07 LAB — COMPREHENSIVE METABOLIC PANEL
ALT: 25 U/L (ref 0–53)
AST: 38 U/L — ABNORMAL HIGH (ref 0–37)
Albumin: 3 g/dL — ABNORMAL LOW (ref 3.5–5.2)
BUN: 21 mg/dL (ref 6–23)
CO2: 29 mEq/L (ref 19–32)
Calcium: 9 mg/dL (ref 8.4–10.5)
Chloride: 102 mEq/L (ref 96–112)
Potassium: 4.1 mEq/L (ref 3.5–5.3)

## 2012-01-07 MED ORDER — ONDANSETRON 8 MG/50ML IVPB (CHCC)
8.0000 mg | Freq: Once | INTRAVENOUS | Status: AC
  Administered 2012-01-07: 8 mg via INTRAVENOUS

## 2012-01-07 MED ORDER — SODIUM CHLORIDE 0.9 % IV SOLN
5.0000 mg/kg | Freq: Once | INTRAVENOUS | Status: AC
  Administered 2012-01-07: 350 mg via INTRAVENOUS
  Filled 2012-01-07: qty 14

## 2012-01-07 MED ORDER — DEXAMETHASONE SODIUM PHOSPHATE 10 MG/ML IJ SOLN
10.0000 mg | Freq: Once | INTRAMUSCULAR | Status: AC
  Administered 2012-01-07: 10 mg via INTRAVENOUS

## 2012-01-07 MED ORDER — LEUCOVORIN CALCIUM INJECTION 350 MG
400.0000 mg/m2 | Freq: Once | INTRAVENOUS | Status: AC
  Administered 2012-01-07: 760 mg via INTRAVENOUS
  Filled 2012-01-07: qty 38

## 2012-01-07 MED ORDER — FLUOROURACIL CHEMO INJECTION 2.5 GM/50ML
400.0000 mg/m2 | Freq: Once | INTRAVENOUS | Status: AC
  Administered 2012-01-07: 750 mg via INTRAVENOUS
  Filled 2012-01-07: qty 15

## 2012-01-07 MED ORDER — DEXTROSE 5 % IV SOLN
Freq: Once | INTRAVENOUS | Status: AC
  Administered 2012-01-07: 11:00:00 via INTRAVENOUS

## 2012-01-07 MED ORDER — OXALIPLATIN CHEMO INJECTION 100 MG/20ML
85.0000 mg/m2 | Freq: Once | INTRAVENOUS | Status: AC
  Administered 2012-01-07: 160 mg via INTRAVENOUS
  Filled 2012-01-07: qty 32

## 2012-01-07 MED ORDER — SODIUM CHLORIDE 0.9 % IV SOLN
2400.0000 mg/m2 | INTRAVENOUS | Status: DC
  Administered 2012-01-07: 4550 mg via INTRAVENOUS
  Filled 2012-01-07: qty 91

## 2012-01-07 NOTE — Patient Instructions (Signed)
[facility] Discharge Instructions for Patients Receiving Chemotherapy  Today you received the following chemotherapy agents Avastin, Oxaliplatin, Leucovorin and 5FU  To help prevent nausea and vomiting after your treatment, we encourage you to take your nausea medication as prescribed.   If you develop nausea and vomiting that is not controlled by your nausea medication, call the clinic. If it is after clinic hours your family physician or the after hours number for the clinic or go to the Emergency Department.   BELOW ARE SYMPTOMS THAT SHOULD BE REPORTED IMMEDIATELY:  *FEVER GREATER THAN 100.5 F  *CHILLS WITH OR WITHOUT FEVER  NAUSEA AND VOMITING THAT IS NOT CONTROLLED WITH YOUR NAUSEA MEDICATION  *UNUSUAL SHORTNESS OF BREATH  *UNUSUAL BRUISING OR BLEEDING  TENDERNESS IN MOUTH AND THROAT WITH OR WITHOUT PRESENCE OF ULCERS  *URINARY PROBLEMS  *BOWEL PROBLEMS  UNUSUAL RASH Items with * indicate a potential emergency and should be followed up as soon as possible.  One of the nurses will contact you 24 hours after your treatment. Please let the nurse know about any problems that you may have experienced. Feel free to call the clinic you have any questions or concerns. The clinic phone number is [phone].   I have been informed and understand all the instructions given to me. I know to contact the clinic, my physician, or go to the Emergency Department if any problems should occur. I do not have any questions at this time, but understand that I may call the clinic during office hours   should I have any questions or need assistance in obtaining follow up care.    __________________________________________  _____________  __________ Signature of Patient or Authorized Representative            Date                   Time    __________________________________________ Nurse's Signature    

## 2012-01-07 NOTE — Telephone Encounter (Signed)
gave patient appointment for 01-21-2012 starting at 10:15am

## 2012-01-07 NOTE — Progress Notes (Signed)
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 male returns to the clinic today for followup visit accompanied by his wife. The patient tolerated the last cycle of his systemic therapy with FOLFOX and Avastin fairly well. He has significant improvement in his general condition was more stamina. He is doing a little bit more at home these days. He still have mild fatigue every now and then. He denied having any significant chest pain or shortness of breath, no cough or hemoptysis. He gained few pounds. The patient is here today to start cycle #3 of his chemotherapy.  MEDICAL HISTORY: Past Medical History  Diagnosis Date  . Lung mass   . Liver cancer   . metastatic adenocarcinoma 11/14/11    liver bx=metastatic adenocarcino33ma with tumor necrosis kras  mutation and involving brain,lung,and bones  . Brain cancer     lesions in occipital lobes,b/l suspicious mets  . Lung cancer     b/l multifocal pulmonary mets  . Hypertension   . Anemia     ALLERGIES:   has no known allergies.  MEDICATIONS:  Current Outpatient Prescriptions  Medication Sig Dispense Refill  . aspirin 81 MG tablet Take 81 mg by mouth daily.      . cholecalciferol (VITAMIN D) 1000 UNITS tablet Take 1,000 Units by mouth 2 (two) times daily.      . Chromium 200 MCG CAPS Take 120 mcg by mouth daily.      . Copper Gluconate (COPPER CAPS PO) Take 2 mg by mouth daily.      Marland Kitchen FeFum-FePoly-FA-B Cmp-C-Biot (INTEGRA PLUS) CAPS Take 1 capsule by mouth every morning.  30  capsule  2  . furosemide (LASIX) 20 MG tablet       . IODINE, KELP, PO Take 150 mg by mouth daily.      Marland Kitchen lidocaine-prilocaine (EMLA) cream       . Magnesium 100 MG CAPS Take 100 mg by mouth daily.      Marland Kitchen MANGANESE PO Take 2 mg by mouth daily.      . Multiple Vitamin (MULITIVITAMIN) LIQD Take 10 mLs by mouth daily.      . niacin 50 MG tablet Take 50 mg by mouth daily.      . ondansetron (ZOFRAN) 4 MG tablet Take 1 tablet (4 mg total) by mouth every 8 (eight) hours as needed for nausea.  60 tablet  3  . peg 3350 powder (MOVIPREP) 100 G SOLR Take 1 kit (100 g total) by mouth once.  1 kit  0  . potassium chloride (K-DUR) 10 MEQ tablet Take 10 mEq by mouth daily.      Marland Kitchen PROBIOTIC CAPS Take 6 capsules by mouth daily.      Marland Kitchen pyridOXINE (VITAMIN B-6) 50 MG tablet Take 50 mg by mouth daily.      . Riboflavin (VITAMIN B-2 PO) Take 50 mg by mouth daily.      Marland Kitchen selenium 50 MCG TABS Take 100 mcg by mouth daily.      . sennosides-docusate sodium (SENOKOT-S) 8.6-50 MG tablet Take 1  tablet by mouth daily.      Marland Kitchen thiamine (VITAMIN B-1) 50 MG tablet Take 50 mg by mouth daily.      . vitamin B-12 (CYANOCOBALAMIN) 500 MCG tablet Take 2,500 mcg by mouth daily.      . vitamin E 100 UNIT capsule Take 100 Units by mouth daily.      . vitamin k 100 MCG tablet Take 80 mcg by mouth daily.      Marland Kitchen albuterol (PROVENTIL) (5 MG/ML) 0.5% nebulizer solution Take 2.5 mg by nebulization every 6 (six) hours as needed.      . Alum & Mag Hydroxide-Simeth (MAGIC MOUTHWASH) SOLN Take 5 mLs by mouth 4 (four) times daily as needed.  240 mL  0  . HYDROcodone-acetaminophen (LORTAB) 7.5-500 MG/15ML solution 7.5 mg.      . lidocaine (XYLOCAINE) 2 % solution Take 5 mLs by mouth Ad lib.      . Promethazine HCl (PHENERGAN PO) Take by mouth as directed.      Marland Kitchen UNABLE TO FIND 50 mg. Pantothenic      . vitamin A 16109 UNIT capsule Take 10,000 Units by mouth daily.      . Zinc Sulfate (ZINC 15 PO) Take 10 mg by mouth daily.      Marland Kitchen zolpidem  (AMBIEN) 10 MG tablet Take 1 tablet (10 mg total) by mouth at bedtime as needed for sleep.  30 tablet  0    SURGICAL HISTORY:  Past Surgical History  Procedure Date  . Hernia repair   . Right port a cath     power port right subclavian  . Colonoscopy 12/06/2011    Procedure: COLONOSCOPY;  Surgeon: Louis Meckel, MD;  Location: [facility];  Service: Endoscopy;  Laterality: N/A;  . Colonoscopy 12/06/2011    Procedure: COLONOSCOPY;  Surgeon: Louis Meckel, MD;  Location: [facility];  Service: Endoscopy;  Laterality: N/A;    REVIEW OF SYSTEMS:  A comprehensive review of systems was negative except for: Constitutional: positive for fatigue   PHYSICAL EXAMINATION: General appearance: alert, cooperative and no distress Head: Normocephalic, without obvious abnormality, atraumatic Neck: no adenopathy Lymph nodes: Cervical, supraclavicular, and axillary nodes normal. Resp: clear to auscultation bilaterally Cardio: regular rate and rhythm, S1, S2 normal, no murmur, click, rub or gallop GI: soft, non-tender; bowel sounds normal; no masses,  no organomegaly Extremities: extremities normal, atraumatic, no cyanosis or edema Neurologic: Alert and oriented X 3, normal strength and tone. Normal symmetric reflexes. Normal coordination and gait  ECOG PERFORMANCE STATUS: 1 - Symptomatic but completely ambulatory  Blood pressure 117/61, pulse 86, temperature 97 F (36.1 C), temperature source Oral, height 5\' 11"  (1.803 m), weight 150 lb 9.6 oz (68.312 kg).  LABORATORY DATA: Lab Results  Component Value Date   WBC 6.3 01/07/2012   HGB 9.4* 01/07/2012   HCT 31.3* 01/07/2012   MCV 74.7* 01/07/2012   PLT 248 01/07/2012      Chemistry      Component Value Date/Time   NA 136 12/24/2011 0927   K 4.7 12/24/2011 0927   CL 101 12/24/2011 0927   CO2 29 12/24/2011 0927   BUN 20 12/24/2011 0927   CREATININE 0.72 12/24/2011 0927      Component Value Date/Time   CALCIUM 8.6 12/24/2011 0927   ALKPHOS 402*  12/24/2011 0927   AST 51* 12/24/2011 0927   ALT 35 12/24/2011 0927   BILITOT 0.6 12/24/2011 0927       RADIOGRAPHIC STUDIES: No results found.  ASSESSMENT: This is a very pleasant 78 years old white male with recently diagnosed metastatic colon adenocarcinoma with K- RAS mutation. He is currently undergoing systemic chemotherapy with FOLFOX and Avastin status post 2 cycles and tolerating it fairly well.  PLAN: We'll proceed with cycle #3 today as scheduled. The patient would come back for followup visit in 2 weeks with the next cycle of his chemotherapy. He was advised to call me immediately if he has any concerning symptoms in the interval.  All questions were answered. The patient knows to call the clinic with any problems, questions or concerns. We can certainly see the patient much sooner if necessary.  I spent 15 minutes counseling the patient face to face. The total time spent in the appointment was 25 minutes.

## 2012-01-07 NOTE — Telephone Encounter (Signed)
Received advisory to avoid use of zolpidem. Pt and wife both voice understanding to not take anymore ambien. Discontinued medication

## 2012-01-08 ENCOUNTER — Other Ambulatory Visit: Payer: Self-pay | Admitting: Radiation Therapy

## 2012-01-08 DIAGNOSIS — C7931 Secondary malignant neoplasm of brain: Secondary | ICD-10-CM

## 2012-01-09 ENCOUNTER — Ambulatory Visit (HOSPITAL_BASED_OUTPATIENT_CLINIC_OR_DEPARTMENT_OTHER): Payer: Medicare Other

## 2012-01-09 VITALS — BP 131/77 | HR 88 | Temp 98.4°F

## 2012-01-09 DIAGNOSIS — Z452 Encounter for adjustment and management of vascular access device: Secondary | ICD-10-CM

## 2012-01-09 DIAGNOSIS — C189 Malignant neoplasm of colon, unspecified: Secondary | ICD-10-CM

## 2012-01-09 MED ORDER — HEPARIN SOD (PORK) LOCK FLUSH 100 UNIT/ML IV SOLN
500.0000 [IU] | Freq: Once | INTRAVENOUS | Status: AC | PRN
  Administered 2012-01-09: 500 [IU]
  Filled 2012-01-09: qty 5

## 2012-01-09 MED ORDER — SODIUM CHLORIDE 0.9 % IJ SOLN
10.0000 mL | INTRAMUSCULAR | Status: DC | PRN
  Administered 2012-01-09: 10 mL
  Filled 2012-01-09: qty 10

## 2012-01-10 NOTE — Addendum Note (Signed)
Encounter addended by: Jonna Coup, MD on: 01/10/2012  9:03 AM<BR>     Documentation filed: Notes Section

## 2012-01-11 ENCOUNTER — Encounter: Payer: Self-pay | Admitting: Radiation Oncology

## 2012-01-18 ENCOUNTER — Encounter: Payer: Self-pay | Admitting: Radiation Oncology

## 2012-01-18 ENCOUNTER — Ambulatory Visit
Admission: RE | Admit: 2012-01-18 | Discharge: 2012-01-18 | Disposition: A | Discharge status: Home / Self Care | Payer: Medicare Other | Source: Ambulatory Visit | Attending: Radiation Oncology | Admitting: Radiation Oncology

## 2012-01-18 VITALS — BP 126/79 | HR 88 | Temp 97.1°F | Resp 18 | Wt 152.6 lb

## 2012-01-18 DIAGNOSIS — C7949 Secondary malignant neoplasm of other parts of nervous system: Secondary | ICD-10-CM

## 2012-01-18 NOTE — Progress Notes (Signed)
Patient presents to the clinic today accompanied by his wife for a follow up appointment with Dr. Mitzi Hansen. Patient is alert and oriented to person, place, and time. No distress noted. Steady gait noted. Pleasant affect noted. Patient denies pain at this time. SOB with exertion has greatly improved. Patient reports neuropathy in his bilateral upper extremities. Patient's wife reports that he has an occasional mild headache behind his right eye. Patient denies dizziness. Patient reports he has to force food because he lacks appetite. Patient's weight stable. Patient denies diplopia or floaters. Patient reports blurry vision in the sunlight. Patient reports sleeping well. Patient reports he is scheduled for his fourth cycle of chemo next week. Reported all findings to Dr. Mitzi Hansen.

## 2012-01-19 NOTE — Progress Notes (Signed)
Radiation Oncology         [phone] ________________________________  Name: Joseph Rowland MRN: [id number]  Date: 01/18/2012  DOB: 04-25-1934  Follow-Up Visit Note  CC: [name], FNP  Si Gaul, MD Melvia Heaps, MD  Barnett Abu, MD  Diagnosis:   Metastatic colon cancer  Interval Since Last Radiation:  Approximately 5 weeks   Narrative:  The patient returns today for routine follow-up.  The patient completed his course of radiosurgery to 2 intracranial lesions on 12/07/2011. The patient states that he has done very well since he finished this. He indicates that his shortness of breath has been improving. He has had some occasional headaches but no increase in the frequency or severity of these. He is continuing on chemotherapy with Dr. Shirline Frees and he feels that this is going reasonably well. No new nausea, no significant vision change. He is not on Decadron.                             ALLERGIES:   has no known allergies.  Meds: Current Outpatient Prescriptions  Medication Sig Dispense Refill  . Alum & Mag Hydroxide-Simeth (MAGIC MOUTHWASH) SOLN Take 5 mLs by mouth 4 (four) times daily as needed.  240 mL  0  . aspirin 81 MG tablet Take 81 mg by mouth daily.      . cholecalciferol (VITAMIN D) 1000 UNITS tablet Take 1,000 Units by mouth 2 (two) times daily.      . Chromium 200 MCG CAPS Take 120 mcg by mouth daily.      . Copper Gluconate (COPPER CAPS PO) Take 2 mg by mouth daily.      Marland Kitchen FeFum-FePoly-FA-B Cmp-C-Biot (INTEGRA PLUS) CAPS Take 1 capsule by mouth every morning.  30 capsule  2  . furosemide (LASIX) 20 MG tablet       . IODINE, KELP, PO Take 150 mg by mouth daily.      . Magnesium 100 MG CAPS Take 100 mg by mouth daily.      Marland Kitchen MANGANESE PO Take 2 mg by mouth daily.      . Multiple Vitamin (MULITIVITAMIN) LIQD Take 10 mLs by mouth daily.      . niacin 50 MG tablet Take 50 mg by mouth daily.      . ondansetron (ZOFRAN) 4 MG tablet Take 1 tablet (4 mg  total) by mouth every 8 (eight) hours as needed for nausea.  60 tablet  3  . peg 3350 powder (MOVIPREP) 100 G SOLR Take 1 kit (100 g total) by mouth once.  1 kit  0  . potassium chloride (K-DUR) 10 MEQ tablet Take 10 mEq by mouth daily.      . potassium chloride (K-DUR,KLOR-CON) 10 MEQ tablet       . PROBIOTIC CAPS Take 6 capsules by mouth daily.      Marland Kitchen pyridOXINE (VITAMIN B-6) 50 MG tablet Take 50 mg by mouth daily.      . Riboflavin (VITAMIN B-2 PO) Take 50 mg by mouth daily.      . sennosides-docusate sodium (SENOKOT-S) 8.6-50 MG tablet Take 1 tablet by mouth daily.      Marland Kitchen thiamine (VITAMIN B-1) 50 MG tablet Take 50 mg by mouth daily.      . vitamin A 82956 UNIT capsule Take 10,000 Units by mouth daily.      . vitamin B-12 (CYANOCOBALAMIN) 500 MCG tablet Take 2,500 mcg by mouth daily.      Marland Kitchen  vitamin E 100 UNIT capsule Take 100 Units by mouth daily.      . vitamin k 100 MCG tablet Take 80 mcg by mouth daily.      . Zinc Sulfate (ZINC 15 PO) Take 10 mg by mouth daily.      Marland Kitchen albuterol (PROVENTIL) (5 MG/ML) 0.5% nebulizer solution Take 2.5 mg by nebulization every 6 (six) hours as needed.      Marland Kitchen HYDROcodone-acetaminophen (LORTAB) 7.5-500 MG/15ML solution 7.5 mg.      . lidocaine (XYLOCAINE) 2 % solution Take 5 mLs by mouth Ad lib.      Marland Kitchen lidocaine-prilocaine (EMLA) cream       . Promethazine HCl (PHENERGAN PO) Take by mouth as directed.      . selenium 50 MCG TABS Take 100 mcg by mouth daily.      Marland Kitchen UNABLE TO FIND 50 mg. Pantothenic      . zolpidem (AMBIEN) 10 MG tablet         Physical Findings: The patient is in no acute distress. Patient is alert and oriented.  weight is 152 lb 9.6 oz (69.219 kg). His oral temperature is 97.1 F (36.2 C). His blood pressure is 126/79 and his pulse is 88. His respiration is 18 and oxygen saturation is 100%. .   General: Well-developed, in no acute distress HEENT: Normocephalic, atraumatic, I closely examined the patient's oral cavity/oropharynx again  due to some prior activity seen on PET scan. No lesion or suspicious findings. I believe that this is an area that can continue to be followed clinically. Cardiovascular: Regular rate and rhythm Respiratory: Clear to auscultation bilaterally GI: Soft, nontender, normal bowel sounds Extremities: No edema present   Lab Findings: Lab Results  Component Value Date   WBC 6.3 01/07/2012   HGB 9.4* 01/07/2012   HCT 31.3* 01/07/2012   MCV 74.7* 01/07/2012   PLT 248 01/07/2012     Radiographic Findings: No results found.  Impression:    Pleasant 78 year old male with metastatic colon cancer status post radiosurgery to 2 intracranial metastases. He is doing well clinically at this time.  Plan:  We will continue followup according to the radiosurgery schedule annual next to see Dr. Danielle Dess after undergoing repeat MRI scan of the brain.   Radene Gunning, M.D., Ph.D.

## 2012-01-21 ENCOUNTER — Encounter: Payer: Self-pay | Admitting: Physician Assistant

## 2012-01-21 ENCOUNTER — Telehealth: Payer: Self-pay | Admitting: Internal Medicine

## 2012-01-21 ENCOUNTER — Ambulatory Visit (HOSPITAL_BASED_OUTPATIENT_CLINIC_OR_DEPARTMENT_OTHER): Payer: Medicare Other | Admitting: Physician Assistant

## 2012-01-21 ENCOUNTER — Ambulatory Visit (HOSPITAL_BASED_OUTPATIENT_CLINIC_OR_DEPARTMENT_OTHER): Payer: Medicare Other

## 2012-01-21 ENCOUNTER — Other Ambulatory Visit (HOSPITAL_BASED_OUTPATIENT_CLINIC_OR_DEPARTMENT_OTHER): Payer: Medicare Other | Admitting: Lab

## 2012-01-21 VITALS — BP 118/74 | HR 81 | Temp 97.3°F | Resp 18 | Ht 71.0 in | Wt 152.5 lb

## 2012-01-21 DIAGNOSIS — C7800 Secondary malignant neoplasm of unspecified lung: Secondary | ICD-10-CM

## 2012-01-21 DIAGNOSIS — Z5112 Encounter for antineoplastic immunotherapy: Secondary | ICD-10-CM

## 2012-01-21 DIAGNOSIS — C7951 Secondary malignant neoplasm of bone: Secondary | ICD-10-CM

## 2012-01-21 DIAGNOSIS — C189 Malignant neoplasm of colon, unspecified: Secondary | ICD-10-CM

## 2012-01-21 DIAGNOSIS — C787 Secondary malignant neoplasm of liver and intrahepatic bile duct: Secondary | ICD-10-CM

## 2012-01-21 DIAGNOSIS — Z5111 Encounter for antineoplastic chemotherapy: Secondary | ICD-10-CM

## 2012-01-21 DIAGNOSIS — C180 Malignant neoplasm of cecum: Secondary | ICD-10-CM

## 2012-01-21 DIAGNOSIS — C187 Malignant neoplasm of sigmoid colon: Secondary | ICD-10-CM

## 2012-01-21 DIAGNOSIS — C7952 Secondary malignant neoplasm of bone marrow: Secondary | ICD-10-CM

## 2012-01-21 LAB — UA PROTEIN, DIPSTICK - CHCC: Protein, ur: NEGATIVE mg/dL

## 2012-01-21 LAB — CBC WITH DIFFERENTIAL/PLATELET
Basophils Absolute: 0 10*3/uL (ref 0.0–0.1)
EOS%: 1.5 % (ref 0.0–7.0)
HGB: 9.9 g/dL — ABNORMAL LOW (ref 13.0–17.1)
LYMPH%: 20.9 % (ref 14.0–49.0)
MCH: 23.5 pg — ABNORMAL LOW (ref 27.2–33.4)
MCV: 76.7 fL — ABNORMAL LOW (ref 79.3–98.0)
MONO%: 12.1 % (ref 0.0–14.0)
RBC: 4.21 10*6/uL (ref 4.20–5.82)
RDW: 26.2 % — ABNORMAL HIGH (ref 11.0–14.6)

## 2012-01-21 LAB — COMPREHENSIVE METABOLIC PANEL
Albumin: 3 g/dL — ABNORMAL LOW (ref 3.5–5.2)
Alkaline Phosphatase: 163 U/L — ABNORMAL HIGH (ref 39–117)
BUN: 19 mg/dL (ref 6–23)
CO2: 25 mEq/L (ref 19–32)
Calcium: 9.1 mg/dL (ref 8.4–10.5)
Chloride: 104 mEq/L (ref 96–112)
Glucose, Bld: 103 mg/dL — ABNORMAL HIGH (ref 70–99)
Potassium: 4.3 mEq/L (ref 3.5–5.3)
Sodium: 139 mEq/L (ref 135–145)
Total Protein: 6 g/dL (ref 6.0–8.3)

## 2012-01-21 MED ORDER — OXALIPLATIN CHEMO INJECTION 100 MG/20ML
85.0000 mg/m2 | Freq: Once | INTRAVENOUS | Status: AC
  Administered 2012-01-21: 160 mg via INTRAVENOUS
  Filled 2012-01-21: qty 32

## 2012-01-21 MED ORDER — FLUOROURACIL CHEMO INJECTION 2.5 GM/50ML
400.0000 mg/m2 | Freq: Once | INTRAVENOUS | Status: AC
  Administered 2012-01-21: 750 mg via INTRAVENOUS
  Filled 2012-01-21: qty 15

## 2012-01-21 MED ORDER — SODIUM CHLORIDE 0.9 % IV SOLN
2400.0000 mg/m2 | INTRAVENOUS | Status: DC
  Administered 2012-01-21: 4550 mg via INTRAVENOUS
  Filled 2012-01-21: qty 91

## 2012-01-21 MED ORDER — LEUCOVORIN CALCIUM INJECTION 350 MG
400.0000 mg/m2 | Freq: Once | INTRAVENOUS | Status: AC
  Administered 2012-01-21: 760 mg via INTRAVENOUS
  Filled 2012-01-21: qty 38

## 2012-01-21 MED ORDER — ONDANSETRON 8 MG/50ML IVPB (CHCC)
8.0000 mg | Freq: Once | INTRAVENOUS | Status: AC
  Administered 2012-01-21: 8 mg via INTRAVENOUS

## 2012-01-21 MED ORDER — DEXTROSE 5 % IV SOLN
Freq: Once | INTRAVENOUS | Status: AC
  Administered 2012-01-21: 12:00:00 via INTRAVENOUS

## 2012-01-21 MED ORDER — SODIUM CHLORIDE 0.9 % IV SOLN
5.0000 mg/kg | Freq: Once | INTRAVENOUS | Status: AC
  Administered 2012-01-21: 350 mg via INTRAVENOUS
  Filled 2012-01-21: qty 14

## 2012-01-21 MED ORDER — SODIUM CHLORIDE 0.9 % IV SOLN
Freq: Once | INTRAVENOUS | Status: AC
  Administered 2012-01-21: 12:00:00 via INTRAVENOUS

## 2012-01-21 MED ORDER — DEXAMETHASONE SODIUM PHOSPHATE 10 MG/ML IJ SOLN
10.0000 mg | Freq: Once | INTRAMUSCULAR | Status: AC
  Administered 2012-01-21: 10 mg via INTRAVENOUS

## 2012-01-21 NOTE — Progress Notes (Signed)
[facility] Telephone:(336) [phone]   [phone]  OFFICE PROGRESS NOTE  [name], FNP [address] [place] [address]  DIAGNOSIS: Metastatic colon adenocarcinoma (KRAS mutation) with metastatic lesion to the brain, lung, liver and bone diagnosed in May of 2013   PRIOR THERAPY: Status post stereotactic radiosurgery to 2 brain lesions   CURRENT THERAPY: Systemic chemotherapy with FOLFOX/Avastin status post 3 cycles  INTERVAL HISTORY: Joseph Rowland 78 y.o. male returns to the clinic today for followup visit accompanied by his wife. The patient tolerated the last cycle of his systemic therapy with FOLFOX and Avastin fairly well. He has significant improvement in his general condition with more stamina. He is able to walk longer distances. He is doing a little bit more at home these days. He requests a refill for his Magic mouthwash. He gets mouth ulcers and mouth soreness after each cycle of chemotherapy and the Magic mouthwash isn't helpful with these lesions. He has some lower extremity edema that is been long-standing affecting the right lower extremity greater than left. He was placed on a Lasix and potassium supplementation by his primary care practitioner in [facility]. He denied having any significant chest pain or shortness of breath, no cough or hemoptysis. The patient is here today to start cycle #4 of his chemotherapy.  MEDICAL HISTORY: Past Medical History  Diagnosis Date  . Lung mass   . Liver cancer   . metastatic adenocarcinoma 11/14/11    liver bx=metastatic adenocarcino46ma with tumor necrosis kras  mutation and involving brain,lung,and bones  . Brain cancer     lesions in occipital lobes,b/l suspicious mets  . Lung cancer     b/l multifocal pulmonary mets  . Hypertension   . Anemia   . History of radiation therapy 12/07/11    SRS single fraction palliative 2 intracranial lesions  . History of chemotherapy  01/07/12    FOLFAX/AVASTIN s/p 3 cycles     ALLERGIES:   has no known allergies.  MEDICATIONS:  Current Outpatient Prescriptions  Medication Sig Dispense Refill  . albuterol (PROVENTIL) (5 MG/ML) 0.5% nebulizer solution Take 2.5 mg by nebulization every 6 (six) hours as needed.      . Alum & Mag Hydroxide-Simeth (MAGIC MOUTHWASH) SOLN Take 5 mLs by mouth 4 (four) times daily as needed.  240 mL  0  . aspirin 81 MG tablet Take 81 mg by mouth daily.      . cholecalciferol (VITAMIN D) 1000 UNITS tablet Take 1,000 Units by mouth 2 (two) times daily.      . Chromium 200 MCG CAPS Take 120 mcg by mouth daily.      . Copper Gluconate (COPPER CAPS PO) Take 2 mg by mouth daily.      Marland Kitchen FeFum-FePoly-FA-B Cmp-C-Biot (INTEGRA PLUS) CAPS Take 1 capsule by mouth every morning.  30 capsule  2  . furosemide (LASIX) 20 MG tablet       . HYDROcodone-acetaminophen (LORTAB) 7.5-500 MG/15ML solution 7.5 mg.      . IODINE, KELP, PO Take 150 mg by mouth daily.      Marland Kitchen lidocaine (XYLOCAINE) 2 % solution Take 5 mLs by mouth Ad lib.      Marland Kitchen lidocaine-prilocaine (EMLA) cream       . Magnesium 100 MG CAPS Take 100 mg by mouth daily.      Marland Kitchen MANGANESE PO Take 2 mg by mouth daily.      . Multiple Vitamin (MULITIVITAMIN)  LIQD Take 10 mLs by mouth daily.      . niacin 50 MG tablet Take 50 mg by mouth daily.      . ondansetron (ZOFRAN) 4 MG tablet Take 1 tablet (4 mg total) by mouth every 8 (eight) hours as needed for nausea.  60 tablet  3  . peg 3350 powder (MOVIPREP) 100 G SOLR Take 1 kit (100 g total) by mouth once.  1 kit  0  . potassium chloride (K-DUR) 10 MEQ tablet Take 10 mEq by mouth daily.      . potassium chloride (K-DUR,KLOR-CON) 10 MEQ tablet       . PROBIOTIC CAPS Take 6 capsules by mouth daily.      . Promethazine HCl (PHENERGAN PO) Take by mouth as directed.      . pyridOXINE (VITAMIN B-6) 50 MG tablet Take 50 mg by mouth daily.      . Riboflavin (VITAMIN B-2 PO) Take 50 mg by mouth daily.      Marland Kitchen selenium  50 MCG TABS Take 100 mcg by mouth daily.      . sennosides-docusate sodium (SENOKOT-S) 8.6-50 MG tablet Take 1 tablet by mouth daily.      Marland Kitchen thiamine (VITAMIN B-1) 50 MG tablet Take 50 mg by mouth daily.      Marland Kitchen UNABLE TO FIND 50 mg. Pantothenic      . vitamin A 16109 UNIT capsule Take 10,000 Units by mouth daily.      . vitamin B-12 (CYANOCOBALAMIN) 500 MCG tablet Take 2,500 mcg by mouth daily.      . vitamin E 100 UNIT capsule Take 100 Units by mouth daily.      . vitamin k 100 MCG tablet Take 80 mcg by mouth daily.      . Zinc Sulfate (ZINC 15 PO) Take 10 mg by mouth daily.      Marland Kitchen zolpidem (AMBIEN) 10 MG tablet         SURGICAL HISTORY:  Past Surgical History  Procedure Date  . Hernia repair   . Right port a cath     power port right subclavian  . Colonoscopy 12/06/2011    Procedure: COLONOSCOPY;  Surgeon: Louis Meckel, MD;  Location: [facility];  Service: Endoscopy;  Laterality: N/A;  . Colonoscopy 12/06/2011    Procedure: COLONOSCOPY;  Surgeon: Louis Meckel, MD;  Location: [facility];  Service: Endoscopy;  Laterality: N/A;    REVIEW OF SYSTEMS:  Pertinent items are noted in HPI.   PHYSICAL EXAMINATION: General appearance: alert, cooperative and no distress Head: Normocephalic, without obvious abnormality, atraumatic Neck: no adenopathy Lymph nodes: Cervical, supraclavicular, and axillary nodes normal. Resp: clear to auscultation bilaterally Cardio: regular rate and rhythm, S1, S2 normal, no murmur, click, rub or gallop GI: soft, non-tender; bowel sounds normal; no masses,  no organomegaly Extremities: edema 1-2+ pitting edema bilateral lower extremities, right greater than left Neurologic: Alert and oriented X 3, normal strength and tone. Normal symmetric reflexes. Normal coordination and gait  ECOG PERFORMANCE STATUS: 1 - Symptomatic but completely ambulatory  Blood pressure 118/74, pulse 81, temperature 97.3 F (36.3 C), temperature source Oral, resp. rate 18,  height 5\' 11"  (1.803 m), weight 152 lb 8 oz (69.174 kg).  LABORATORY DATA: Lab Results  Component Value Date   WBC 5.3 01/21/2012   HGB 9.9* 01/21/2012   HCT 32.3* 01/21/2012   MCV 76.7* 01/21/2012   PLT 229 01/21/2012      Chemistry  Component Value Date/Time   NA 138 01/07/2012 0927   K 4.1 01/07/2012 0927   CL 102 01/07/2012 0927   CO2 29 01/07/2012 0927   BUN 21 01/07/2012 0927   CREATININE 0.87 01/07/2012 0927      Component Value Date/Time   CALCIUM 9.0 01/07/2012 0927   ALKPHOS 226* 01/07/2012 0927   AST 38* 01/07/2012 0927   ALT 25 01/07/2012 0927   BILITOT 0.3 01/07/2012 0927       RADIOGRAPHIC STUDIES: No results found.  ASSESSMENT/PLAN: This is a very pleasant 78 years old white male with recently diagnosed metastatic colon adenocarcinoma with K- RAS mutation. He is currently undergoing systemic chemotherapy with FOLFOX and Avastin status post 3 cycles and tolerating it fairly well. The patient was discussed with Dr. Arbutus Ped. He will proceed with cycle #4 as scheduled today. He'll follow up with Dr. Arbutus Ped in 2 weeks with a repeat CBC differential, C. met and urine protein dipstick prior to cycle #5. His Magic mouthwash was called in to his pharmacy of record 240 in males with 1 refill. He was advised to followup with his primary care practitioner regarding his lower extremity edema and any refills for his Lasix potassium. He was further advised to increase his intake of water and elevate his lower extremities.  Laural Benes, [place] Miyoshi E, PA-C   All questions were answered. The patient knows to call the clinic with any problems, questions or concerns. We can certainly see the patient much sooner if necessary.  I spent 20 minutes counseling the patient face to face. The total time spent in the appointment was 30 minutes.

## 2012-01-21 NOTE — Telephone Encounter (Signed)
appts made and printed for pt,pt aware that we will call with 8/19 chemo time (mel. D to add tx )

## 2012-01-21 NOTE — Patient Instructions (Addendum)
[facility] Discharge Instructions for Patients Receiving Chemotherapy  Today you received the following chemotherapy agents Oxaliplatin, Leucovorin, Avastin, Fluorouracil  To help prevent nausea and vomiting after your treatment, we encourage you to take your nausea medication  Begin taking it at 7 pm and take it as often as prescribed for the next 24 to 72 hours.   If you develop nausea and vomiting that is not controlled by your nausea medication, call the clinic. If it is after clinic hours your family physician or the after hours number for the clinic or go to the Emergency Department.   BELOW ARE SYMPTOMS THAT SHOULD BE REPORTED IMMEDIATELY:  *FEVER GREATER THAN 100.5 F  *CHILLS WITH OR WITHOUT FEVER  NAUSEA AND VOMITING THAT IS NOT CONTROLLED WITH YOUR NAUSEA MEDICATION  *UNUSUAL SHORTNESS OF BREATH  *UNUSUAL BRUISING OR BLEEDING  TENDERNESS IN MOUTH AND THROAT WITH OR WITHOUT PRESENCE OF ULCERS  *URINARY PROBLEMS  *BOWEL PROBLEMS  UNUSUAL RASH Items with * indicate a potential emergency and should be followed up as soon as possible.  One of the nurses will contact you 24 hours after your treatment. Please let the nurse know about any problems that you may have experienced. Feel free to call the clinic you have any questions or concerns. The clinic phone number is [phone].   I have been informed and understand all the instructions given to me. I know to contact the clinic, my physician, or go to the Emergency Department if any problems should occur. I do not have any questions at this time, but understand that I may call the clinic during office hours   should I have any questions or need assistance in obtaining follow up care.    __________________________________________  _____________  __________ Signature of Patient or Authorized Representative            Date                   Time    __________________________________________ Nurse's  Signature

## 2012-01-22 ENCOUNTER — Other Ambulatory Visit: Payer: Self-pay | Admitting: *Deleted

## 2012-01-23 ENCOUNTER — Ambulatory Visit (HOSPITAL_BASED_OUTPATIENT_CLINIC_OR_DEPARTMENT_OTHER): Payer: Medicare Other

## 2012-01-23 ENCOUNTER — Telehealth: Payer: Self-pay | Admitting: Medical Oncology

## 2012-01-23 VITALS — BP 122/76 | HR 68 | Temp 97.4°F

## 2012-01-23 DIAGNOSIS — C187 Malignant neoplasm of sigmoid colon: Secondary | ICD-10-CM

## 2012-01-23 DIAGNOSIS — Z452 Encounter for adjustment and management of vascular access device: Secondary | ICD-10-CM

## 2012-01-23 DIAGNOSIS — C189 Malignant neoplasm of colon, unspecified: Secondary | ICD-10-CM

## 2012-01-23 DIAGNOSIS — C7800 Secondary malignant neoplasm of unspecified lung: Secondary | ICD-10-CM

## 2012-01-23 MED ORDER — SODIUM CHLORIDE 0.9 % IJ SOLN
10.0000 mL | INTRAMUSCULAR | Status: DC | PRN
  Administered 2012-01-23: 10 mL
  Filled 2012-01-23: qty 10

## 2012-01-23 MED ORDER — HEPARIN SOD (PORK) LOCK FLUSH 100 UNIT/ML IV SOLN
500.0000 [IU] | Freq: Once | INTRAVENOUS | Status: AC | PRN
  Administered 2012-01-23: 500 [IU]
  Filled 2012-01-23: qty 5

## 2012-01-23 NOTE — Progress Notes (Signed)
Joseph Rowland has small round bruise on upper right foot.  He reports some pain with bruised area.  Advised warm compress to area, continue regular home medications-dhp, rn

## 2012-01-23 NOTE — Patient Instructions (Signed)
Call MD with any problems 

## 2012-01-23 NOTE — Telephone Encounter (Signed)
Left message that pt has bruising on foot. I called Emalee back . Pt right foot topside is bruised- " better today" I told her to have the infusion nurse look at it today when he comes in for pump disconnect. She can apply a  cold compress to area too.

## 2012-01-24 ENCOUNTER — Telehealth: Payer: Self-pay | Admitting: Internal Medicine

## 2012-01-24 NOTE — Telephone Encounter (Signed)
pts wife had l/m re;appt times,l/m for her with appts rimes and to c/b if needed   aom

## 2012-01-28 ENCOUNTER — Telehealth: Payer: Self-pay | Admitting: Internal Medicine

## 2012-01-28 NOTE — Telephone Encounter (Signed)
S/w pt today re appts for 8/13 adn 8/19. Pt to get schedule when he comes in and will have his wife call to confirm.

## 2012-01-29 ENCOUNTER — Other Ambulatory Visit: Payer: Medicare Other | Admitting: Lab

## 2012-02-01 ENCOUNTER — Ambulatory Visit: Payer: Medicare Other | Admitting: Physician Assistant

## 2012-02-04 ENCOUNTER — Ambulatory Visit (HOSPITAL_BASED_OUTPATIENT_CLINIC_OR_DEPARTMENT_OTHER): Payer: Medicare Other | Admitting: Internal Medicine

## 2012-02-04 ENCOUNTER — Other Ambulatory Visit (HOSPITAL_BASED_OUTPATIENT_CLINIC_OR_DEPARTMENT_OTHER): Payer: Medicare Other | Admitting: Lab

## 2012-02-04 ENCOUNTER — Other Ambulatory Visit: Payer: Self-pay | Admitting: Internal Medicine

## 2012-02-04 ENCOUNTER — Ambulatory Visit (HOSPITAL_BASED_OUTPATIENT_CLINIC_OR_DEPARTMENT_OTHER): Payer: Medicare Other

## 2012-02-04 VITALS — BP 106/63 | HR 87 | Temp 97.0°F | Resp 18 | Ht 71.0 in | Wt 152.0 lb

## 2012-02-04 VITALS — BP 109/65 | HR 63

## 2012-02-04 DIAGNOSIS — C7951 Secondary malignant neoplasm of bone: Secondary | ICD-10-CM

## 2012-02-04 DIAGNOSIS — C7800 Secondary malignant neoplasm of unspecified lung: Secondary | ICD-10-CM

## 2012-02-04 DIAGNOSIS — C187 Malignant neoplasm of sigmoid colon: Secondary | ICD-10-CM

## 2012-02-04 DIAGNOSIS — Z5112 Encounter for antineoplastic immunotherapy: Secondary | ICD-10-CM

## 2012-02-04 DIAGNOSIS — C189 Malignant neoplasm of colon, unspecified: Secondary | ICD-10-CM

## 2012-02-04 DIAGNOSIS — C180 Malignant neoplasm of cecum: Secondary | ICD-10-CM

## 2012-02-04 DIAGNOSIS — Z5111 Encounter for antineoplastic chemotherapy: Secondary | ICD-10-CM

## 2012-02-04 DIAGNOSIS — C787 Secondary malignant neoplasm of liver and intrahepatic bile duct: Secondary | ICD-10-CM

## 2012-02-04 LAB — CBC WITH DIFFERENTIAL/PLATELET
Basophils Absolute: 0 10*3/uL (ref 0.0–0.1)
EOS%: 0.7 % (ref 0.0–7.0)
HCT: 34.5 % — ABNORMAL LOW (ref 38.4–49.9)
HGB: 10.8 g/dL — ABNORMAL LOW (ref 13.0–17.1)
LYMPH%: 18.5 % (ref 14.0–49.0)
MCH: 25 pg — ABNORMAL LOW (ref 27.2–33.4)
MONO#: 0.7 10*3/uL (ref 0.1–0.9)
NEUT%: 64.3 % (ref 39.0–75.0)
Platelets: 178 10*3/uL (ref 140–400)
lymph#: 0.8 10*3/uL — ABNORMAL LOW (ref 0.9–3.3)

## 2012-02-04 LAB — COMPREHENSIVE METABOLIC PANEL
Albumin: 3.1 g/dL — ABNORMAL LOW (ref 3.5–5.2)
BUN: 19 mg/dL (ref 6–23)
Calcium: 8.6 mg/dL (ref 8.4–10.5)
Chloride: 106 mEq/L (ref 96–112)
Creatinine, Ser: 0.69 mg/dL (ref 0.50–1.35)
Glucose, Bld: 89 mg/dL (ref 70–99)
Potassium: 4.3 mEq/L (ref 3.5–5.3)

## 2012-02-04 MED ORDER — LEUCOVORIN CALCIUM INJECTION 350 MG
400.0000 mg/m2 | Freq: Once | INTRAVENOUS | Status: AC
  Administered 2012-02-04: 760 mg via INTRAVENOUS
  Filled 2012-02-04: qty 38

## 2012-02-04 MED ORDER — SODIUM CHLORIDE 0.9 % IV SOLN
5.0000 mg/kg | Freq: Once | INTRAVENOUS | Status: AC
  Administered 2012-02-04: 350 mg via INTRAVENOUS
  Filled 2012-02-04: qty 14

## 2012-02-04 MED ORDER — SODIUM CHLORIDE 0.9 % IV SOLN
Freq: Once | INTRAVENOUS | Status: AC
  Administered 2012-02-04: 13:00:00 via INTRAVENOUS

## 2012-02-04 MED ORDER — DEXTROSE 5 % IV SOLN
Freq: Once | INTRAVENOUS | Status: AC
  Administered 2012-02-04: 14:00:00 via INTRAVENOUS

## 2012-02-04 MED ORDER — SODIUM CHLORIDE 0.9 % IV SOLN
2400.0000 mg/m2 | INTRAVENOUS | Status: DC
  Administered 2012-02-04: 4550 mg via INTRAVENOUS
  Filled 2012-02-04: qty 91

## 2012-02-04 MED ORDER — ONDANSETRON 8 MG/50ML IVPB (CHCC)
8.0000 mg | Freq: Once | INTRAVENOUS | Status: AC
  Administered 2012-02-04: 8 mg via INTRAVENOUS

## 2012-02-04 MED ORDER — OXALIPLATIN CHEMO INJECTION 100 MG/20ML
85.0000 mg/m2 | Freq: Once | INTRAVENOUS | Status: AC
  Administered 2012-02-04: 160 mg via INTRAVENOUS
  Filled 2012-02-04: qty 32

## 2012-02-04 MED ORDER — DEXAMETHASONE SODIUM PHOSPHATE 10 MG/ML IJ SOLN
10.0000 mg | Freq: Once | INTRAMUSCULAR | Status: AC
  Administered 2012-02-04: 10 mg via INTRAVENOUS

## 2012-02-04 MED ORDER — FLUOROURACIL CHEMO INJECTION 2.5 GM/50ML
400.0000 mg/m2 | Freq: Once | INTRAVENOUS | Status: AC
  Administered 2012-02-04: 750 mg via INTRAVENOUS
  Filled 2012-02-04: qty 15

## 2012-02-04 NOTE — Patient Instructions (Addendum)
[facility] Discharge Instructions for Patients Receiving Chemotherapy  Today you received the following chemotherapy agents Avastin, 5FU, Leucovorin and Oxaliplatin.  To help prevent nausea and vomiting after your treatment, we encourage you to take your nausea medication as ordered per MD.    If you develop nausea and vomiting that is not controlled by your nausea medication, call the clinic. If it is after clinic hours your family physician or the after hours number for the clinic or go to the Emergency Department.   BELOW ARE SYMPTOMS THAT SHOULD BE REPORTED IMMEDIATELY:  *FEVER GREATER THAN 100.5 F  *CHILLS WITH OR WITHOUT FEVER  NAUSEA AND VOMITING THAT IS NOT CONTROLLED WITH YOUR NAUSEA MEDICATION  *UNUSUAL SHORTNESS OF BREATH  *UNUSUAL BRUISING OR BLEEDING  TENDERNESS IN MOUTH AND THROAT WITH OR WITHOUT PRESENCE OF ULCERS  *URINARY PROBLEMS  *BOWEL PROBLEMS  UNUSUAL RASH Items with * indicate a potential emergency and should be followed up as soon as possible.   Please let the nurse know about any problems that you may have experienced. Feel free to call the clinic you have any questions or concerns. The clinic phone number is [phone].   I have been informed and understand all the instructions given to me. I know to contact the clinic, my physician, or go to the Emergency Department if any problems should occur. I do not have any questions at this time, but understand that I may call the clinic during office hours   should I have any questions or need assistance in obtaining follow up care.    __________________________________________  _____________  __________ Signature of Patient or Authorized Representative            Date                   Time    __________________________________________ Nurse's Signature

## 2012-02-04 NOTE — Progress Notes (Signed)
[facility] Telephone:([phone]   Fax:(336) [phone]  OFFICE PROGRESS NOTE  [name], FNP [address] [place] [address]  DIAGNOSIS: Metastatic colon adenocarcinoma (KRAS mutation) with metastatic lesion to the brain, lung, liver and bone diagnosed in May of 2013   PRIOR THERAPY: Status post stereotactic radiosurgery to 2 brain lesions   CURRENT THERAPY: Systemic chemotherapy with FOLFOX/Avastin status post 4 cycles  INTERVAL HISTORY: Joseph Rowland 78 y.o. male returns to the clinic today for followup visit accompanied by his wife. The patient is doing fine and tolerating his treatment fairly well with no significant adverse effects except for mild nausea the week after his chemotherapy. He denied having any significant chest pain, shortness breath, cough or hemoptysis. He is very active and was able to mow his large yard this weekend. The patient denied having any bleeding issues. He is here today to start cycle #5 of his chemotherapy with FOLFOX/Avastin.  MEDICAL HISTORY: Past Medical History  Diagnosis Date  . Lung mass   . Liver cancer   . metastatic adenocarcinoma 11/14/11    liver bx=metastatic adenocarcino14ma with tumor necrosis kras  mutation and involving brain,lung,and bones  . Brain cancer     lesions in occipital lobes,b/l suspicious mets  . Lung cancer     b/l multifocal pulmonary mets  . Hypertension   . Anemia   . History of radiation therapy 12/07/11    SRS single fraction palliative 2 intracranial lesions  . History of chemotherapy 01/07/12    FOLFAX/AVASTIN s/p 3 cycles     ALLERGIES:   has no known allergies.  MEDICATIONS:  Current Outpatient Prescriptions  Medication Sig Dispense Refill  . albuterol (PROVENTIL) (5 MG/ML) 0.5% nebulizer solution Take 2.5 mg by nebulization every 6 (six) hours as needed.      . Alum & Mag Hydroxide-Simeth (MAGIC MOUTHWASH) SOLN Take 5 mLs by mouth 4 (four) times daily as  needed.  240 mL  0  . cholecalciferol (VITAMIN D) 1000 UNITS tablet Take 1,000 Units by mouth 2 (two) times daily.      . Chromium 200 MCG CAPS Take 120 mcg by mouth daily.      . Copper Gluconate (COPPER CAPS PO) Take 2 mg by mouth daily.      Marland Kitchen FeFum-FePoly-FA-B Cmp-C-Biot (INTEGRA PLUS) CAPS Take 1 capsule by mouth every morning.  30 capsule  2  . furosemide (LASIX) 20 MG tablet       . HYDROcodone-acetaminophen (LORTAB) 7.5-500 MG/15ML solution 7.5 mg.      . IODINE, KELP, PO Take 150 mg by mouth daily.      Marland Kitchen lidocaine (XYLOCAINE) 2 % solution Take 5 mLs by mouth Ad lib.      Marland Kitchen lidocaine-prilocaine (EMLA) cream       . Magnesium 100 MG CAPS Take 100 mg by mouth daily.      Marland Kitchen MANGANESE PO Take 2 mg by mouth daily.      . Multiple Vitamin (MULITIVITAMIN) LIQD Take 10 mLs by mouth daily.      . niacin 50 MG tablet Take 50 mg by mouth daily.      . ondansetron (ZOFRAN) 4 MG tablet Take 1 tablet (4 mg total) by mouth every 8 (eight) hours as needed for nausea.  60 tablet  3  . peg 3350 powder (MOVIPREP) 100 G SOLR Take 1 kit (100 g total) by mouth once.  1 kit  0  . potassium chloride (  K-DUR) 10 MEQ tablet Take 10 mEq by mouth daily.      . potassium chloride (K-DUR,KLOR-CON) 10 MEQ tablet       . PROBIOTIC CAPS Take 6 capsules by mouth daily.      . Promethazine HCl (PHENERGAN PO) Take by mouth as directed.      . pyridOXINE (VITAMIN B-6) 50 MG tablet Take 50 mg by mouth daily.      . Riboflavin (VITAMIN B-2 PO) Take 50 mg by mouth daily.      Marland Kitchen selenium 50 MCG TABS Take 100 mcg by mouth daily.      . sennosides-docusate sodium (SENOKOT-S) 8.6-50 MG tablet Take 1 tablet by mouth daily.      Marland Kitchen thiamine (VITAMIN B-1) 50 MG tablet Take 50 mg by mouth daily.      Marland Kitchen UNABLE TO FIND 50 mg. Pantothenic      . vitamin A 40981 UNIT capsule Take 10,000 Units by mouth daily.      . vitamin B-12 (CYANOCOBALAMIN) 500 MCG tablet Take 2,500 mcg by mouth daily.      . vitamin E 100 UNIT capsule Take 100  Units by mouth daily.      . vitamin k 100 MCG tablet Take 80 mcg by mouth daily.      . Zinc Sulfate (ZINC 15 PO) Take 10 mg by mouth daily.      Marland Kitchen zolpidem (AMBIEN) 10 MG tablet       . aspirin 81 MG tablet Take 81 mg by mouth daily.        SURGICAL HISTORY:  Past Surgical History  Procedure Date  . Hernia repair   . Right port a cath     power port right subclavian  . Colonoscopy 12/06/2011    Procedure: COLONOSCOPY;  Surgeon: Louis Meckel, MD;  Location: [facility];  Service: Endoscopy;  Laterality: N/A;  . Colonoscopy 12/06/2011    Procedure: COLONOSCOPY;  Surgeon: Louis Meckel, MD;  Location: [facility];  Service: Endoscopy;  Laterality: N/A;    REVIEW OF SYSTEMS:  A comprehensive review of systems was negative.   PHYSICAL EXAMINATION: General appearance: alert, cooperative and no distress Head: Normocephalic, without obvious abnormality, atraumatic Neck: no adenopathy Lymph nodes: Cervical, supraclavicular, and axillary nodes normal. Resp: clear to auscultation bilaterally Cardio: regular rate and rhythm, S1, S2 normal, no murmur, click, rub or gallop GI: soft, non-tender; bowel sounds normal; no masses,  no organomegaly Extremities: extremities normal, atraumatic, no cyanosis or edema Neurologic: Alert and oriented X 3, normal strength and tone. Normal symmetric reflexes. Normal coordination and gait  ECOG PERFORMANCE STATUS: 1 - Symptomatic but completely ambulatory  Blood pressure 106/63, pulse 87, temperature 97 F (36.1 C), temperature source Oral, resp. rate 18, height 5\' 11"  (1.803 m), weight 152 lb (68.947 kg).  LABORATORY DATA: Lab Results  Component Value Date   WBC 4.4 02/04/2012   HGB 10.8* 02/04/2012   HCT 34.5* 02/04/2012   MCV 79.9 02/04/2012   PLT 178 02/04/2012      Chemistry      Component Value Date/Time   NA 139 01/21/2012 1012   K 4.3 01/21/2012 1012   CL 104 01/21/2012 1012   CO2 25 01/21/2012 1012   BUN 19 01/21/2012 1012   CREATININE 0.76  01/21/2012 1012      Component Value Date/Time   CALCIUM 9.1 01/21/2012 1012   ALKPHOS 163* 01/21/2012 1012   AST 38* 01/21/2012 1012   ALT 20 01/21/2012  1012   BILITOT 0.4 01/21/2012 1012       RADIOGRAPHIC STUDIES: No results found.  ASSESSMENT: This is a very pleasant 78 years old white male with metastatic colon adenocarcinoma status post stereotactic radio surgery to 2 brain lesions and currently undergoing systemic chemotherapy with FOLFOX/Avastin status post 4 cycles. The patient is doing fine and tolerating his treatment fairly well and he has significant improvement in his general condition.  PLAN: I recommended for the patient to continue his current treatment as scheduled. He'll receive cycle #5 today. He would have cycle #6 in 2 weeks followed by a repeat CT scan of the head, chest, abdomen and pelvis for restaging of his disease. He was advised to call me immediately if he has any concerning symptoms in the interval..  All questions were answered. The patient knows to call the clinic with any problems, questions or concerns. We can certainly see the patient much sooner if necessary.  I spent 15 minutes counseling the patient face to face. The total time spent in the appointment was 25 minutes.

## 2012-02-05 NOTE — Progress Notes (Addendum)
  Radiation Oncology         [phone] ________________________________  Name: Joseph Rowland MRN: [id number]  Date: 12/07/2011  DOB: Aug 18, 1933   SPECIAL TREATMENT PROCEDURE   3D TREATMENT PLANNING AND DOSIMETRY: The patient's radiation plan was reviewed and approved by Dr. Danielle Dess from neurosurgery and radiation oncology prior to treatment. It showed 3-dimensional radiation distributions overlaid onto the planning CT/MRI image set. The [facility] for the target structures as well as the organs at risk were reviewed. The documentation of the 3D plan and dosimetry are filed in the radiation oncology EMR.   NARRATIVE: The patient was brought to the TrueBeam stereotactic radiation treatment machine and placed supine on the CT couch. The head frame was applied, and the patient was set up for stereotactic radiosurgery. Neurosurgery was present for the set-up and delivery   SIMULATION VERIFICATION: In the couch zero-angle position, the patient underwent Exactrac imaging using the Brainlab system with orthogonal KV images. These were carefully aligned and repeated to confirm treatment position for each of the isocenters. The Exactrac snap film verification was repeated at each couch angle.   SPECIAL TREATMENT PROCEDURE: The patient received stereotactic radiosurgery to the following targets:  1. 1. right occipital lobe, 5 mm: The lesion was treated with 4 dynamic conformal arcs. The lesion was treated with 20 gray in 1 fraction.  2. left occipital lobe, 2 mm: The lesion was treated with 5 arcs using a 6 mm cone. The lesion was treated with 20 gray in 1 fraction.  ExacTrac Snap verification was performed for each couch angle.   STEREOTACTIC TREATMENT MANAGEMENT: Following delivery, the patient was transported to nursing in stable condition and monitored for possible acute effects. Vital signs were recorded . The patient tolerated treatment without significant acute effects, and was discharged to home in  stable condition.  PLAN: Follow-up in one month.   ------------------------------------------------  Radene Gunning, MD, PhD

## 2012-02-05 NOTE — Addendum Note (Signed)
Encounter addended by: Jonna Coup, MD on: 02/05/2012 11:30 AM<BR>     Documentation filed: Visit Diagnoses, Notes Section

## 2012-02-05 NOTE — Addendum Note (Signed)
Encounter addended by: Jonna Coup, MD on: 02/05/2012 11:43 AM<BR>     Documentation filed: Notes Section

## 2012-02-06 ENCOUNTER — Ambulatory Visit (HOSPITAL_BASED_OUTPATIENT_CLINIC_OR_DEPARTMENT_OTHER): Payer: Medicare Other

## 2012-02-06 VITALS — BP 111/70 | HR 79 | Temp 97.1°F

## 2012-02-06 DIAGNOSIS — C189 Malignant neoplasm of colon, unspecified: Secondary | ICD-10-CM

## 2012-02-06 DIAGNOSIS — C180 Malignant neoplasm of cecum: Secondary | ICD-10-CM

## 2012-02-06 DIAGNOSIS — C787 Secondary malignant neoplasm of liver and intrahepatic bile duct: Secondary | ICD-10-CM

## 2012-02-06 DIAGNOSIS — Z452 Encounter for adjustment and management of vascular access device: Secondary | ICD-10-CM

## 2012-02-06 DIAGNOSIS — C7800 Secondary malignant neoplasm of unspecified lung: Secondary | ICD-10-CM

## 2012-02-06 MED ORDER — HEPARIN SOD (PORK) LOCK FLUSH 100 UNIT/ML IV SOLN
500.0000 [IU] | Freq: Once | INTRAVENOUS | Status: AC | PRN
  Administered 2012-02-06: 500 [IU]
  Filled 2012-02-06: qty 5

## 2012-02-06 MED ORDER — SODIUM CHLORIDE 0.9 % IJ SOLN
10.0000 mL | INTRAMUSCULAR | Status: DC | PRN
  Administered 2012-02-06: 10 mL
  Filled 2012-02-06: qty 10

## 2012-02-07 ENCOUNTER — Other Ambulatory Visit: Payer: Self-pay | Admitting: Certified Registered Nurse Anesthetist

## 2012-02-11 ENCOUNTER — Other Ambulatory Visit: Payer: Medicare Other | Admitting: Lab

## 2012-02-12 ENCOUNTER — Telehealth: Payer: Self-pay | Admitting: Internal Medicine

## 2012-02-12 NOTE — Telephone Encounter (Signed)
S/w wife re next appt for 9/3. Pt to get new schedule when he comes in.

## 2012-02-17 ENCOUNTER — Encounter (HOSPITAL_COMMUNITY)
Admission: RE | Admit: 2012-02-17 | Discharge: 2012-02-17 | Disposition: A | Discharge status: Home / Self Care | Payer: Medicare Other | Source: Ambulatory Visit | Attending: Internal Medicine | Admitting: Internal Medicine

## 2012-02-19 ENCOUNTER — Other Ambulatory Visit (HOSPITAL_BASED_OUTPATIENT_CLINIC_OR_DEPARTMENT_OTHER): Payer: Medicare Other | Admitting: Lab

## 2012-02-19 ENCOUNTER — Ambulatory Visit (HOSPITAL_BASED_OUTPATIENT_CLINIC_OR_DEPARTMENT_OTHER): Payer: Medicare Other | Admitting: Internal Medicine

## 2012-02-19 ENCOUNTER — Telehealth: Payer: Self-pay | Admitting: Internal Medicine

## 2012-02-19 ENCOUNTER — Ambulatory Visit (HOSPITAL_BASED_OUTPATIENT_CLINIC_OR_DEPARTMENT_OTHER): Payer: Medicare Other

## 2012-02-19 VITALS — BP 107/62 | HR 110 | Temp 97.1°F | Resp 20 | Ht 71.0 in | Wt 156.2 lb

## 2012-02-19 DIAGNOSIS — C7949 Secondary malignant neoplasm of other parts of nervous system: Secondary | ICD-10-CM

## 2012-02-19 DIAGNOSIS — Z5111 Encounter for antineoplastic chemotherapy: Secondary | ICD-10-CM

## 2012-02-19 DIAGNOSIS — C787 Secondary malignant neoplasm of liver and intrahepatic bile duct: Secondary | ICD-10-CM

## 2012-02-19 DIAGNOSIS — C189 Malignant neoplasm of colon, unspecified: Secondary | ICD-10-CM

## 2012-02-19 DIAGNOSIS — C7800 Secondary malignant neoplasm of unspecified lung: Secondary | ICD-10-CM

## 2012-02-19 DIAGNOSIS — C180 Malignant neoplasm of cecum: Secondary | ICD-10-CM

## 2012-02-19 DIAGNOSIS — Z5112 Encounter for antineoplastic immunotherapy: Secondary | ICD-10-CM

## 2012-02-19 LAB — COMPREHENSIVE METABOLIC PANEL (CC13)
Albumin: 2.8 g/dL — ABNORMAL LOW (ref 3.5–5.0)
Alkaline Phosphatase: 164 U/L — ABNORMAL HIGH (ref 40–150)
BUN: 15 mg/dL (ref 7.0–26.0)
CO2: 24 mEq/L (ref 22–29)
Chloride: 108 mEq/L — ABNORMAL HIGH (ref 98–107)
Glucose: 147 mg/dl — ABNORMAL HIGH (ref 70–99)
Potassium: 3.9 mEq/L (ref 3.5–5.1)
Sodium: 141 mEq/L (ref 136–145)
Total Protein: 6 g/dL — ABNORMAL LOW (ref 6.4–8.3)

## 2012-02-19 LAB — CBC WITH DIFFERENTIAL/PLATELET
Basophils Absolute: 0 10*3/uL (ref 0.0–0.1)
Eosinophils Absolute: 0 10*3/uL (ref 0.0–0.5)
HCT: 35.5 % — ABNORMAL LOW (ref 38.4–49.9)
HGB: 11.4 g/dL — ABNORMAL LOW (ref 13.0–17.1)
LYMPH%: 22.6 % (ref 14.0–49.0)
MCV: 84.4 fL (ref 79.3–98.0)
MONO%: 20.3 % — ABNORMAL HIGH (ref 0.0–14.0)
NEUT#: 2.1 10*3/uL (ref 1.5–6.5)
Platelets: 152 10*3/uL (ref 140–400)

## 2012-02-19 LAB — CORRECTED CALCIUM (CC13): Calcium, Corrected: 10.1 mg/dL (ref 8.4–10.4)

## 2012-02-19 MED ORDER — DEXAMETHASONE SODIUM PHOSPHATE 10 MG/ML IJ SOLN
10.0000 mg | Freq: Once | INTRAMUSCULAR | Status: AC
  Administered 2012-02-19: 10 mg via INTRAVENOUS

## 2012-02-19 MED ORDER — SODIUM CHLORIDE 0.9 % IV SOLN
5.0000 mg/kg | Freq: Once | INTRAVENOUS | Status: AC
  Administered 2012-02-19: 350 mg via INTRAVENOUS
  Filled 2012-02-19: qty 14

## 2012-02-19 MED ORDER — SODIUM CHLORIDE 0.9 % IJ SOLN
10.0000 mL | INTRAMUSCULAR | Status: DC | PRN
  Administered 2012-02-19: 10 mL
  Filled 2012-02-19: qty 10

## 2012-02-19 MED ORDER — FLUOROURACIL CHEMO INJECTION 2.5 GM/50ML
400.0000 mg/m2 | Freq: Once | INTRAVENOUS | Status: AC
  Administered 2012-02-19: 750 mg via INTRAVENOUS
  Filled 2012-02-19: qty 15

## 2012-02-19 MED ORDER — DEXTROSE 5 % IV SOLN
Freq: Once | INTRAVENOUS | Status: AC
  Administered 2012-02-19: 13:00:00 via INTRAVENOUS

## 2012-02-19 MED ORDER — SODIUM CHLORIDE 0.9 % IV SOLN
Freq: Once | INTRAVENOUS | Status: AC
  Administered 2012-02-19: 16:00:00 via INTRAVENOUS

## 2012-02-19 MED ORDER — LEUCOVORIN CALCIUM INJECTION 350 MG
400.0000 mg/m2 | Freq: Once | INTRAVENOUS | Status: AC
  Administered 2012-02-19: 760 mg via INTRAVENOUS
  Filled 2012-02-19: qty 38

## 2012-02-19 MED ORDER — SODIUM CHLORIDE 0.9 % IV SOLN
2400.0000 mg/m2 | INTRAVENOUS | Status: DC
  Administered 2012-02-19: 4550 mg via INTRAVENOUS
  Filled 2012-02-19: qty 91

## 2012-02-19 MED ORDER — OXALIPLATIN CHEMO INJECTION 100 MG/20ML
85.0000 mg/m2 | Freq: Once | INTRAVENOUS | Status: AC
  Administered 2012-02-19: 160 mg via INTRAVENOUS
  Filled 2012-02-19: qty 32

## 2012-02-19 MED ORDER — ONDANSETRON 8 MG/50ML IVPB (CHCC)
8.0000 mg | Freq: Once | INTRAVENOUS | Status: AC
  Administered 2012-02-19: 8 mg via INTRAVENOUS

## 2012-02-19 NOTE — Patient Instructions (Addendum)
[facility] Discharge Instructions for Patients Receiving Chemotherapy  Today you received the following chemotherapy agents: oxaliplatin, leucovorin, 61fu, avastin. To help prevent nausea and vomiting after your treatment, we encourage you to take your nausea medication.  Take it as often as prescribed.     If you develop nausea and vomiting that is not controlled by your nausea medication, call the clinic. If it is after clinic hours your family physician or the after hours number for the clinic or go to the Emergency Department.   BELOW ARE SYMPTOMS THAT SHOULD BE REPORTED IMMEDIATELY:  *FEVER GREATER THAN 100.5 F  *CHILLS WITH OR WITHOUT FEVER  NAUSEA AND VOMITING THAT IS NOT CONTROLLED WITH YOUR NAUSEA MEDICATION  *UNUSUAL SHORTNESS OF BREATH  *UNUSUAL BRUISING OR BLEEDING  TENDERNESS IN MOUTH AND THROAT WITH OR WITHOUT PRESENCE OF ULCERS  *URINARY PROBLEMS  *BOWEL PROBLEMS  UNUSUAL RASH Items with * indicate a potential emergency and should be followed up as soon as possible.  Feel free to call the clinic you have any questions or concerns. The clinic phone number is [phone].   I have been informed and understand all the instructions given to me. I know to contact the clinic, my physician, or go to the Emergency Department if any problems should occur. I do not have any questions at this time, but understand that I may call the clinic during office hours   should I have any questions or need assistance in obtaining follow up care.    __________________________________________  _____________  __________ Signature of Patient or Authorized Representative            Date                   Time    __________________________________________ Nurse's Signature

## 2012-02-19 NOTE — Patient Instructions (Addendum)
CBC okay today We'll proceed with chemotherapy. Followup in 2 weeks with repeat CT scan of the chest, abdomen and pelvis for restaging of your disease.

## 2012-02-19 NOTE — Telephone Encounter (Signed)
Gave pt appt for Ct , with oral contrast , NPO 4 hours prior to CT, September 2013.Joseph Rowland regarding chemo for September and October  2013

## 2012-02-19 NOTE — Progress Notes (Signed)
[facility] [phone]   [phone]  OFFICE PROGRESS NOTE  [name], FNP [address] [place] [address]  DIAGNOSIS: Metastatic colon adenocarcinoma (KRAS mutation) with metastatic lesion to the brain, lung, liver and bone diagnosed in May of 2013   PRIOR THERAPY: Status post stereotactic radiosurgery to 2 brain lesions   CURRENT THERAPY: Systemic chemotherapy with FOLFOX/Avastin status post 5 cycles  INTERVAL HISTORY: Joseph Rowland 78 y.o. male returns to the clinic today for followup visit accompanied by his wife. The patient is doing fine with no specific complaints. He was able to mow his yard yesterday. He denied having any significant weight loss or night sweats. He denied having any significant chest pain, shortness breath, cough or hemoptysis. The patient is here today to start cycle #6 of his chemotherapy.  MEDICAL HISTORY: Past Medical History  Diagnosis Date  . Lung mass   . Liver cancer   . metastatic adenocarcinoma 11/14/11    liver bx=metastatic adenocarcino60ma with tumor necrosis kras  mutation and involving brain,lung,and bones  . Brain cancer     lesions in occipital lobes,b/l suspicious mets  . Lung cancer     b/l multifocal pulmonary mets  . Hypertension   . Anemia   . History of radiation therapy 12/07/11    SRS single fraction palliative 2 intracranial lesions  . History of chemotherapy 01/07/12    FOLFAX/AVASTIN s/p 3 cycles     ALLERGIES:   has no known allergies.  MEDICATIONS:  Current Outpatient Prescriptions  Medication Sig Dispense Refill  . albuterol (PROVENTIL) (5 MG/ML) 0.5% nebulizer solution Take 2.5 mg by nebulization every 6 (six) hours as needed.      . Alum & Mag Hydroxide-Simeth (MAGIC MOUTHWASH) SOLN Take 5 mLs by mouth 4 (four) times daily as needed.  240 mL  0  . aspirin 81 MG tablet Take 81 mg by mouth daily.      . cholecalciferol (VITAMIN D) 1000 UNITS tablet Take  1,000 Units by mouth 2 (two) times daily.      . Chromium 200 MCG CAPS Take 120 mcg by mouth daily.      . Copper Gluconate (COPPER CAPS PO) Take 2 mg by mouth daily.      Marland Kitchen FeFum-FePoly-FA-B Cmp-C-Biot (INTEGRA PLUS) CAPS Take 1 capsule by mouth every morning.  30 capsule  2  . furosemide (LASIX) 20 MG tablet       . HYDROcodone-acetaminophen (LORTAB) 7.5-500 MG/15ML solution 7.5 mg.      . IODINE, KELP, PO Take 150 mg by mouth daily.      Marland Kitchen lidocaine (XYLOCAINE) 2 % solution Take 5 mLs by mouth Ad lib.      Marland Kitchen lidocaine-prilocaine (EMLA) cream       . Magnesium 100 MG CAPS Take 100 mg by mouth daily.      Marland Kitchen MANGANESE PO Take 2 mg by mouth daily.      . Multiple Vitamin (MULITIVITAMIN) LIQD Take 10 mLs by mouth daily.      . niacin 50 MG tablet Take 50 mg by mouth daily.      . ondansetron (ZOFRAN) 4 MG tablet Take 1 tablet (4 mg total) by mouth every 8 (eight) hours as needed for nausea.  60 tablet  3  . peg 3350 powder (MOVIPREP) 100 G SOLR Take 1 kit (100 g total) by mouth once.  1 kit  0  . potassium chloride (K-DUR) 10 MEQ tablet  Take 10 mEq by mouth daily.      . potassium chloride (K-DUR,KLOR-CON) 10 MEQ tablet       . PROBIOTIC CAPS Take 6 capsules by mouth daily.      . Promethazine HCl (PHENERGAN PO) Take by mouth as directed.      . pyridOXINE (VITAMIN B-6) 50 MG tablet Take 50 mg by mouth daily.      . Riboflavin (VITAMIN B-2 PO) Take 50 mg by mouth daily.      Marland Kitchen selenium 50 MCG TABS Take 100 mcg by mouth daily.      . sennosides-docusate sodium (SENOKOT-S) 8.6-50 MG tablet Take 1 tablet by mouth daily.      Marland Kitchen thiamine (VITAMIN B-1) 50 MG tablet Take 50 mg by mouth daily.      Marland Kitchen UNABLE TO FIND 50 mg. Pantothenic      . vitamin A 16109 UNIT capsule Take 10,000 Units by mouth daily.      . vitamin B-12 (CYANOCOBALAMIN) 500 MCG tablet Take 2,500 mcg by mouth daily.      . vitamin E 100 UNIT capsule Take 100 Units by mouth daily.      . vitamin k 100 MCG tablet Take 80 mcg by  mouth daily.      . Zinc Sulfate (ZINC 15 PO) Take 10 mg by mouth daily.      Marland Kitchen zolpidem (AMBIEN) 10 MG tablet         SURGICAL HISTORY:  Past Surgical History  Procedure Date  . Hernia repair   . Right port a cath     power port right subclavian  . Colonoscopy 12/06/2011    Procedure: COLONOSCOPY;  Surgeon: Louis Meckel, MD;  Location: [facility];  Service: Endoscopy;  Laterality: N/A;  . Colonoscopy 12/06/2011    Procedure: COLONOSCOPY;  Surgeon: Louis Meckel, MD;  Location: [facility];  Service: Endoscopy;  Laterality: N/A;    REVIEW OF SYSTEMS:  A comprehensive review of systems was negative.   PHYSICAL EXAMINATION: General appearance: alert, cooperative and no distress Head: Normocephalic, without obvious abnormality, atraumatic Neck: no adenopathy Lymph nodes: Cervical, supraclavicular, and axillary nodes normal. Resp: clear to auscultation bilaterally Cardio: regular rate and rhythm, S1, S2 normal, no murmur, click, rub or gallop GI: soft, non-tender; bowel sounds normal; no masses,  no organomegaly Extremities: extremities normal, atraumatic, no cyanosis or edema Neurologic: Alert and oriented X 3, normal strength and tone. Normal symmetric reflexes. Normal coordination and gait  ECOG PERFORMANCE STATUS: 1 - Symptomatic but completely ambulatory  Blood pressure 107/62, pulse 110, temperature 97.1 F (36.2 C), temperature source Oral, resp. rate 20, height 5\' 11"  (1.803 m), weight 156 lb 3.2 oz (70.852 kg).  LABORATORY DATA: Lab Results  Component Value Date   WBC 3.8* 02/19/2012   HGB 11.4* 02/19/2012   HCT 35.5* 02/19/2012   MCV 84.4 02/19/2012   PLT 152 02/19/2012      Chemistry      Component Value Date/Time   NA 141 02/19/2012 1121   NA 141 02/04/2012 1034   K 3.9 02/19/2012 1121   K 4.3 02/04/2012 1034   CL 108* 02/19/2012 1121   CL 106 02/04/2012 1034   CO2 24 02/19/2012 1121   CO2 26 02/04/2012 1034   BUN 15.0 02/19/2012 1121   BUN 19 02/04/2012 1034    CREATININE 0.8 02/19/2012 1121   CREATININE 0.69 02/04/2012 1034      Component Value Date/Time   CALCIUM 8.6 02/04/2012  1034   ALKPHOS 164* 02/19/2012 1121   ALKPHOS 153* 02/04/2012 1034   AST 39* 02/19/2012 1121   AST 36 02/04/2012 1034   ALT 26 02/19/2012 1121   ALT 22 02/04/2012 1034   BILITOT 0.40 02/19/2012 1121   BILITOT 0.3 02/04/2012 1034       RADIOGRAPHIC STUDIES: No results found.  ASSESSMENT: This is a very pleasant 77 years old white male with history of metastatic colon adenocarcinoma currently on systemic chemotherapy with FOLFOX/Avastin status post 5 cycles.  PLAN: The patient is tolerating his treatment fairly well with no significant adverse effects. I recommended for him to proceed with cycle #6 today as scheduled. I would see him back for followup visit in 2 weeks with repeat CT scan of the chest, abdomen and pelvis for restaging of his disease.  The patient was advised to call me immediately if he has any concerning symptoms in the interval.  All questions were answered. The patient knows to call the clinic with any problems, questions or concerns. We can certainly see the patient much sooner if necessary.  I spent 15 minutes counseling the patient face to face. The total time spent in the appointment was 25 minutes.

## 2012-02-20 ENCOUNTER — Ambulatory Visit: Payer: Medicare Other

## 2012-02-20 NOTE — Progress Notes (Signed)
Pump alarm continues to go off . I replaced pump and batteries and programmed pump to finish at 1640 tomorrow ( pt thinks pump was off for 2 hours). Pt stayed for 20 minutes and pump did not alarm, Pt d/c to home accompanied by wife

## 2012-02-21 ENCOUNTER — Ambulatory Visit (HOSPITAL_BASED_OUTPATIENT_CLINIC_OR_DEPARTMENT_OTHER): Payer: Medicare Other

## 2012-02-21 ENCOUNTER — Ambulatory Visit: Payer: Medicare Other | Admitting: Physician Assistant

## 2012-02-21 VITALS — BP 132/86 | HR 92 | Temp 98.0°F

## 2012-02-21 DIAGNOSIS — C189 Malignant neoplasm of colon, unspecified: Secondary | ICD-10-CM

## 2012-02-21 DIAGNOSIS — C7800 Secondary malignant neoplasm of unspecified lung: Secondary | ICD-10-CM

## 2012-02-21 DIAGNOSIS — C180 Malignant neoplasm of cecum: Secondary | ICD-10-CM

## 2012-02-21 MED ORDER — HEPARIN SOD (PORK) LOCK FLUSH 100 UNIT/ML IV SOLN
500.0000 [IU] | Freq: Once | INTRAVENOUS | Status: AC | PRN
  Administered 2012-02-21: 500 [IU]
  Filled 2012-02-21: qty 5

## 2012-02-21 MED ORDER — SODIUM CHLORIDE 0.9 % IJ SOLN
10.0000 mL | INTRAMUSCULAR | Status: DC | PRN
  Administered 2012-02-21: 10 mL
  Filled 2012-02-21: qty 10

## 2012-02-21 NOTE — Patient Instructions (Signed)
Call MD for problems 

## 2012-02-22 ENCOUNTER — Other Ambulatory Visit: Payer: Self-pay | Admitting: Certified Registered Nurse Anesthetist

## 2012-02-25 ENCOUNTER — Other Ambulatory Visit: Payer: Self-pay | Admitting: Internal Medicine

## 2012-02-25 DIAGNOSIS — C189 Malignant neoplasm of colon, unspecified: Secondary | ICD-10-CM

## 2012-02-25 DIAGNOSIS — C787 Secondary malignant neoplasm of liver and intrahepatic bile duct: Secondary | ICD-10-CM

## 2012-02-28 ENCOUNTER — Emergency Department (HOSPITAL_COMMUNITY): Payer: Medicare Other

## 2012-02-28 ENCOUNTER — Emergency Department (HOSPITAL_COMMUNITY)
Admission: EM | Admit: 2012-02-28 | Discharge: 2012-02-28 | Disposition: A | Discharge status: Home / Self Care | Payer: Medicare Other | Attending: Emergency Medicine | Admitting: Emergency Medicine

## 2012-02-28 ENCOUNTER — Ambulatory Visit (HOSPITAL_COMMUNITY)
Admission: RE | Admit: 2012-02-28 | Discharge: 2012-02-28 | Disposition: A | Discharge status: Home / Self Care | Payer: Medicare Other | Source: Ambulatory Visit | Attending: Internal Medicine | Admitting: Internal Medicine

## 2012-02-28 ENCOUNTER — Other Ambulatory Visit: Payer: Self-pay

## 2012-02-28 ENCOUNTER — Encounter (HOSPITAL_COMMUNITY): Payer: Self-pay | Admitting: *Deleted

## 2012-02-28 ENCOUNTER — Telehealth: Payer: Self-pay | Admitting: Oncology

## 2012-02-28 DIAGNOSIS — R188 Other ascites: Secondary | ICD-10-CM | POA: Insufficient documentation

## 2012-02-28 DIAGNOSIS — C7931 Secondary malignant neoplasm of brain: Secondary | ICD-10-CM | POA: Insufficient documentation

## 2012-02-28 DIAGNOSIS — I2510 Atherosclerotic heart disease of native coronary artery without angina pectoris: Secondary | ICD-10-CM | POA: Insufficient documentation

## 2012-02-28 DIAGNOSIS — J90 Pleural effusion, not elsewhere classified: Secondary | ICD-10-CM | POA: Insufficient documentation

## 2012-02-28 DIAGNOSIS — N400 Enlarged prostate without lower urinary tract symptoms: Secondary | ICD-10-CM | POA: Insufficient documentation

## 2012-02-28 DIAGNOSIS — C787 Secondary malignant neoplasm of liver and intrahepatic bile duct: Secondary | ICD-10-CM | POA: Insufficient documentation

## 2012-02-28 DIAGNOSIS — I7090 Unspecified atherosclerosis: Secondary | ICD-10-CM | POA: Insufficient documentation

## 2012-02-28 DIAGNOSIS — C7800 Secondary malignant neoplasm of unspecified lung: Secondary | ICD-10-CM | POA: Insufficient documentation

## 2012-02-28 DIAGNOSIS — C799 Secondary malignant neoplasm of unspecified site: Secondary | ICD-10-CM

## 2012-02-28 DIAGNOSIS — Q619 Cystic kidney disease, unspecified: Secondary | ICD-10-CM | POA: Insufficient documentation

## 2012-02-28 DIAGNOSIS — C189 Malignant neoplasm of colon, unspecified: Secondary | ICD-10-CM | POA: Insufficient documentation

## 2012-02-28 DIAGNOSIS — K639 Disease of intestine, unspecified: Secondary | ICD-10-CM | POA: Insufficient documentation

## 2012-02-28 DIAGNOSIS — R599 Enlarged lymph nodes, unspecified: Secondary | ICD-10-CM | POA: Insufficient documentation

## 2012-02-28 DIAGNOSIS — I517 Cardiomegaly: Secondary | ICD-10-CM | POA: Insufficient documentation

## 2012-02-28 DIAGNOSIS — I513 Intracardiac thrombosis, not elsewhere classified: Secondary | ICD-10-CM

## 2012-02-28 DIAGNOSIS — I5189 Other ill-defined heart diseases: Secondary | ICD-10-CM | POA: Insufficient documentation

## 2012-02-28 LAB — BASIC METABOLIC PANEL
BUN: 11 mg/dL (ref 6–23)
CO2: 27 mEq/L (ref 19–32)
Chloride: 100 mEq/L (ref 96–112)
Creatinine, Ser: 0.9 mg/dL (ref 0.50–1.35)
Glucose, Bld: 109 mg/dL — ABNORMAL HIGH (ref 70–99)

## 2012-02-28 LAB — CBC WITH DIFFERENTIAL/PLATELET
Basophils Absolute: 0 10*3/uL (ref 0.0–0.1)
Eosinophils Absolute: 0.1 10*3/uL (ref 0.0–0.7)
HCT: 32.9 % — ABNORMAL LOW (ref 39.0–52.0)
Lymphs Abs: 1.2 10*3/uL (ref 0.7–4.0)
MCH: 27.6 pg (ref 26.0–34.0)
MCHC: 32.5 g/dL (ref 30.0–36.0)
MCV: 84.8 fL (ref 78.0–100.0)
Neutro Abs: 1.7 10*3/uL (ref 1.7–7.7)
RDW: 29.6 % — ABNORMAL HIGH (ref 11.5–15.5)

## 2012-02-28 MED ORDER — IOHEXOL 300 MG/ML  SOLN
100.0000 mL | Freq: Once | INTRAMUSCULAR | Status: AC | PRN
  Administered 2012-02-28: 100 mL via INTRAVENOUS

## 2012-02-28 MED ORDER — HEPARIN SOD (PORK) LOCK FLUSH 100 UNIT/ML IV SOLN
INTRAVENOUS | Status: AC
  Administered 2012-02-28: 21:00:00
  Filled 2012-02-28: qty 5

## 2012-02-28 MED ORDER — WARFARIN SODIUM 5 MG PO TABS
5.0000 mg | ORAL_TABLET | Freq: Once | ORAL | Status: AC
  Administered 2012-02-28: 5 mg via ORAL
  Filled 2012-02-28 (×2): qty 1

## 2012-02-28 MED ORDER — WARFARIN SODIUM 5 MG PO TABS: 5.0000 mg | ORAL_TABLET | Freq: Every day | ORAL | Status: DC

## 2012-02-28 MED ORDER — SODIUM CHLORIDE 0.9 % IV SOLN
INTRAVENOUS | Status: DC
  Administered 2012-02-28: 18:00:00 via INTRAVENOUS

## 2012-02-28 NOTE — ED Notes (Signed)
Family at bedside. 

## 2012-02-28 NOTE — ED Provider Notes (Signed)
History     CSN: [id number]  Arrival date & time 02/28/12  1541   First MD Initiated Contact with Patient 02/28/12 1704      Chief Complaint  Patient presents with  . Shortness of Breath    (Consider location/radiation/quality/duration/timing/severity/associated sxs/prior treatment) The history is provided by the patient and a relative.   78 year old, male, with a history of metastatic colon cancer, presents emergency department for evaluation of a CAT scan result, which he had today.  His oncologist ordered a CAT scan to monitor the response to chemotherapy of metastases to his thoracic region.  He was called by the oncology staff and told to come to the emergency department because there was a suspicious lesion in his heart.  The patient has no symptoms.  He denies pain, shortness of breath.  Cough.  He states that he would like to go home and eat or walk.  All over.  [place].  Without difficulty.  He is not taking any anticoagulants.  Other than aspirin.  Past Medical History  Diagnosis Date  . Lung mass   . Liver cancer   . 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 Brain cancer     lesions in occipital lobes,b/l suspicious mets  . Lung cancer     b/l multifocal pulmonary mets  . Hypertension   . Anemia   . History of radiation therapy 12/07/11    SRS single fraction palliative 2 intracranial lesions  . History of chemotherapy 01/07/12    FOLFAX/AVASTIN s/p 3 cycles     Past Surgical History  Procedure Date  . Hernia repair   . Right port a cath     power port right subclavian  . Colonoscopy 12/06/2011    Procedure: COLONOSCOPY;  Surgeon: Louis Meckel, MD;  Location: [facility];  Service: Endoscopy;  Laterality: N/A;  . Colonoscopy 12/06/2011    Procedure: COLONOSCOPY;  Surgeon: Louis Meckel, MD;  Location: [facility];  Service: Endoscopy;  Laterality: N/A;      Family History  Problem Relation Age of Onset  . Colon cancer Neg Hx     History  Substance Use Topics  . Smoking status: Former Smoker    Types: Cigarettes    Quit date: 06/18/1978  . Smokeless tobacco: Current User  . Alcohol Use: No      Review of Systems  Respiratory: Negative for cough and shortness of breath.   Cardiovascular: Negative for chest pain.  Neurological: Negative for weakness.  Hematological: Does not bruise/bleed easily.  All other systems reviewed and are negative.    Allergies  Review of patient's allergies indicates no known allergies.  Home Medications   Current Outpatient Rx  Name Route Sig Dispense Refill  . ALBUTEROL SULFATE (5 MG/ML) 0.5% IN NEBU Nebulization Take 2.5 mg by nebulization every 6 (six) hours as needed. For shortness of breath.    Marland Kitchen MAGIC MOUTHWASH Swish & Spit Swish and spit 5 mLs 4 (four) times daily.    . ASPIRIN EC 81 MG PO TBEC Oral Take 81 mg by mouth daily.    Marland Kitchen VITAMIN D 1000 UNITS PO TABS Oral Take 1,000 Units by mouth daily.    Marland Kitchen VITAMIN B-12 2500 MCG SL SUBL Sublingual Place 1 tablet under the tongue daily.    Marland Kitchen FERROUS SULFATE 325 (65 FE) MG PO TABS Oral Take 325 mg by mouth 3 (three) times daily with  meals.    Marland Kitchen LIDOCAINE-PRILOCAINE 2.5-2.5 % EX CREA Topical Apply 1 application topically as needed. For port-a-cath access.    . ADULT MULTIVITAMIN LIQUID CH Oral Take 10 mLs by mouth daily.    Marland Kitchen POTASSIUM CHLORIDE CRYS ER 20 MEQ PO TBCR Oral Take 20 mEq by mouth daily.    Marland Kitchen PRESCRIPTION MEDICATION Intravenous Inject into the vein every 14 (fourteen) days. Avastin, Leucovorin, Adrucil, Eloxatin    . PROBIOTIC PO CAPS Oral Take 2 capsules by mouth 3 (three) times daily.     . WARFARIN SODIUM 5 MG PO TABS Oral Take 1 tablet (5 mg total) by mouth daily. 30 tablet 0    BP 138/80  Pulse 76  Temp 97.7 F (36.5 C) (Oral)  Resp 22  Ht 5\' 11"  (1.803 m)  Wt 150 lb (68.04 kg)  BMI 20.92 kg/m2  SpO2 99%  Physical Exam   Nursing note and vitals reviewed. Constitutional: He is oriented to person, place, and time. He appears well-developed and well-nourished. No distress.  HENT:  Head: Normocephalic and atraumatic.  Eyes: Conjunctivae normal and EOM are normal.  Neck: Normal range of motion. Neck supple.  Cardiovascular: Normal rate and intact distal pulses.   No murmur heard. Pulmonary/Chest: Effort normal and breath sounds normal. No respiratory distress. He has no rales.  Abdominal: He exhibits no distension.  Musculoskeletal: Normal range of motion.  Neurological: He is alert and oriented to person, place, and time.  Skin: Skin is warm and dry.  Psychiatric: He has a normal mood and affect. Thought content normal.    ED Course  Procedures (including critical care time) 78 year old, male, with metastatic colon cancer, who presents emergency department for evaluation.  After having a CAT scan performed today, which showed an abnormality in his heart.  I reviewed the CAT scan, and it shows that there may be a thrombus in the left ventricle.  I will discuss this with the cardiologist, to determine what further testing or treatment needs to be done.  Labs Reviewed  BASIC METABOLIC PANEL - Abnormal; Notable for the following:    Sodium 134 (*)     Glucose, Bld 109 (*)     GFR calc non Af Amer 81 (*)     All other components within normal limits  CBC WITH DIFFERENTIAL - Abnormal; Notable for the following:    WBC 3.4 (*)     RBC 3.88 (*)     Hemoglobin 10.7 (*)     HCT 32.9 (*)     RDW 29.6 (*)     Platelets 124 (*)     All other components within normal limits  PROTIME-INR   Dg Chest 2 View  02/28/2012  *RADIOLOGY REPORT*  Clinical Data: Shortness of breath.  CHEST - 2 VIEW  Comparison: CT chest performed earlier today.  Findings: Cardiomegaly.  Multiple metastatic lesions throughout the chest.  Port-A-Cath good position.  No active infiltrates or failure.  Mild scoliosis convex right without  visible compression fracture.  IMPRESSION: No active infiltrates or failure.  Multiple lung metastases. Cardiomegaly.   Original Report Authenticated By: Elsie Stain, M.D.    Ct Chest W Contrast  02/28/2012  *RADIOLOGY REPORT*  Clinical Data:  History of metastatic colon cancer.  Chemotherapy ongoing.  CT CHEST, ABDOMEN AND PELVIS WITH CONTRAST  Technique:  Multidetector CT imaging of the chest, abdomen and pelvis was performed following the standard protocol during bolus administration of intravenous contrast.  Contrast: OMNIPAQUE IOHEXOL 300  MG/ML  SOLN  Comparison:  Chest CT 11/02/2011.  PET CT 11/21/2011.  CT CHEST  Findings:  Mediastinum: Heart size is to moderately enlarged with left ventricular dilatation.  Notably, there are two small filling defects within the apex of the left ventricle best demonstrated on images 54 and 55 of series 2, which are new compared to the prior examination from 11/02/2011, and highly suspicious for intracardiac thrombus (less likely to represent foci of metastatic disease). There is no significant pericardial fluid, thickening or pericardial calcification. There is atherosclerosis of the thoracic aorta, the great vessels of the mediastinum and the coronary arteries, including calcified atherosclerotic plaque in the left main, left anterior descending, diagonal one, ramus intermedius, left circumflex and right coronary arteries. . No pathologically enlarged mediastinal or hilar lymph nodes. Borderline enlarged superior right paratracheal lymph node measuring 9 mm is similar to prior examination and nonspecific.  Esophagus is unremarkable in appearance. Right internal jugular single lumen Port-A-Cath with tip terminating at the superior cavoatrial junction.  Lungs/Pleura: Trace right-sided pleural effusion has decreased. Previously noted left-sided pleural effusion has completely resolved.  Large right lower lobe pulmonary nodule (image 49 of series 5) has slightly  decreased in size measuring 2.7 x 2.4 cm on today's examination.  Pleural-based left lower lobe nodule (image 39 of series 5) is very similar to the prior study measuring 2.1 x 1.4 cm on today's examination.  1.3 cm left upper lobe nodule (image 28 series 5) is unchanged.  5 mm subpleural nodule in the medial aspect of the lingula (image 35 of series 5) is unchanged. 11 mm nodule in the medial basal segment of the right lower lobe (image 44 of series 5) is unchanged.  No other definite new pulmonary nodules are noted.  No acute consolidative airspace disease.  Musculoskeletal: There are no aggressive appearing lytic or blastic lesions noted in the visualized portions of the skeleton.  IMPRESSION:  1.  Slight decrease in size in the largest pulmonary nodule within the right lower lobe, which currently measures 2.7 x 2.4 cm.  There has also been resolution of the left pleural effusion and decreased size of what is now a trace right pleural effusion.  Metastatic disease to the lungs is otherwise very similar to the prior study, as above. 2.  There are two new small filling defects in the left ventricular apex, highly concerning for left ventricular apical thrombus (less likely to represent metastatic disease to the myocardium).  This places the patient at risk for systemic embolization.  Clinical correlation is recommended. 3. Atherosclerosis, including left main and multivessel coronary artery disease. 4.  Cardiomegaly with left ventricular dilatation. 5.  Additional incidental findings, as above.  CT ABDOMEN AND PELVIS  Findings:  Abdomen/Pelvis: There are numerous nodular and mass-like hypovascular lesions scattered throughout the liver parenchyma which appear very similar in size, number and distribution compared to the noncontrast CT portion of the PET examination from 11/21/2011.  Exact size comparison between the examinations is not possible secondary to differences in technique.  A specific eight samples  including a 804.6 x 3.5 cm mass in the inferior aspect of segment six (image 76 of series 2), and a 3.6 x 2.6 cm mass in segment for a (image 58 of series 2).  The appearance of the gallbladder, spleen, bilateral adrenal glands and the right kidney is unremarkable.  Numerous low-attenuation lesions in the left kidney are compatible with cysts, the largest of which extends exophytically from the lower pole of the  left kidney measuring 5.1 cm in diameter.  Marked thickening of the cecum which has a somewhat mass-like appearance, highly suspicious for primary colonic neoplasm.  The remainder of the colon is otherwise unremarkable in appearance. Trace ascites.  Prominent lymph nodes in the ileocolic mesentery (example on image 96 of series 2 measuring 11 x 15 mm), concerning for nodal disease.  No pathologic distension of small bowel.  No pneumoperitoneum.  Atherosclerosis of the abdominal and pelvic vasculature, without definite aneurysm or dissection.  The prostate is mildly enlarged and heterogeneous in appearance.  Urinary bladder is unremarkable.  Musculoskeletal: Well defined lucent lesion with a narrow zone of transition and slight sclerosis of the margins in the posterior aspect of the right proximal femur is unchanged (this was not hypermetabolic on prior PET CT), and is presumably benign. There are no aggressive appearing lytic or blastic lesions noted in the visualized portions of the skeleton.  IMPRESSION:  1.  Large cecal mass with a ileocolic lymphadenopathy and diffuse hepatic metastatic disease.  The overall burden of disease in the liver appears very similar to the prior PET CT 11/21/2011. 2. Trace volume of ascites. 3.  Multiple left renal cysts redemonstrated. 4. Atherosclerosis.  These results were called by telephone on 02/28/2012 at 02:35 p.m. to Dr. Welton Flakes (covering for Dr. Arbutus Ped), who verbally acknowledged these results.   Original Report Authenticated By: Florencia Reasons, M.D.    Ct Abdomen  Pelvis W Contrast  02/28/2012  *RADIOLOGY REPORT*  Clinical Data:  History of metastatic colon cancer.  Chemotherapy ongoing.  CT CHEST, ABDOMEN AND PELVIS WITH CONTRAST  Technique:  Multidetector CT imaging of the chest, abdomen and pelvis was performed following the standard protocol during bolus administration of intravenous contrast.  Contrast: OMNIPAQUE IOHEXOL 300 MG/ML  SOLN  Comparison:  Chest CT 11/02/2011.  PET CT 11/21/2011.  CT CHEST  Findings:  Mediastinum: Heart size is to moderately enlarged with left ventricular dilatation.  Notably, there are two small filling defects within the apex of the left ventricle best demonstrated on images 54 and 55 of series 2, which are new compared to the prior examination from 11/02/2011, and highly suspicious for intracardiac thrombus (less likely to represent foci of metastatic disease). There is no significant pericardial fluid, thickening or pericardial calcification. There is atherosclerosis of the thoracic aorta, the great vessels of the mediastinum and the coronary arteries, including calcified atherosclerotic plaque in the left main, left anterior descending, diagonal one, ramus intermedius, left circumflex and right coronary arteries. . No pathologically enlarged mediastinal or hilar lymph nodes. Borderline enlarged superior right paratracheal lymph node measuring 9 mm is similar to prior examination and nonspecific.  Esophagus is unremarkable in appearance. Right internal jugular single lumen Port-A-Cath with tip terminating at the superior cavoatrial junction.  Lungs/Pleura: Trace right-sided pleural effusion has decreased. Previously noted left-sided pleural effusion has completely resolved.  Large right lower lobe pulmonary nodule (image 49 of series 5) has slightly decreased in size measuring 2.7 x 2.4 cm on today's examination.  Pleural-based left lower lobe nodule (image 39 of series 5) is very similar to the prior study measuring 2.1 x 1.4 cm on  today's examination.  1.3 cm left upper lobe nodule (image 28 series 5) is unchanged.  5 mm subpleural nodule in the medial aspect of the lingula (image 35 of series 5) is unchanged. 11 mm nodule in the medial basal segment of the right lower lobe (image 44 of series 5) is unchanged.  No  other definite new pulmonary nodules are noted.  No acute consolidative airspace disease.  Musculoskeletal: There are no aggressive appearing lytic or blastic lesions noted in the visualized portions of the skeleton.  IMPRESSION:  1.  Slight decrease in size in the largest pulmonary nodule within the right lower lobe, which currently measures 2.7 x 2.4 cm.  There has also been resolution of the left pleural effusion and decreased size of what is now a trace right pleural effusion.  Metastatic disease to the lungs is otherwise very similar to the prior study, as above. 2.  There are two new small filling defects in the left ventricular apex, highly concerning for left ventricular apical thrombus (less likely to represent metastatic disease to the myocardium).  This places the patient at risk for systemic embolization.  Clinical correlation is recommended. 3. Atherosclerosis, including left main and multivessel coronary artery disease. 4.  Cardiomegaly with left ventricular dilatation. 5.  Additional incidental findings, as above.  CT ABDOMEN AND PELVIS  Findings:  Abdomen/Pelvis: There are numerous nodular and mass-like hypovascular lesions scattered throughout the liver parenchyma which appear very similar in size, number and distribution compared to the noncontrast CT portion of the PET examination from 11/21/2011.  Exact size comparison between the examinations is not possible secondary to differences in technique.  A specific eight samples including a 804.6 x 3.5 cm mass in the inferior aspect of segment six (image 76 of series 2), and a 3.6 x 2.6 cm mass in segment for a (image 58 of series 2).  The appearance of the  gallbladder, spleen, bilateral adrenal glands and the right kidney is unremarkable.  Numerous low-attenuation lesions in the left kidney are compatible with cysts, the largest of which extends exophytically from the lower pole of the left kidney measuring 5.1 cm in diameter.  Marked thickening of the cecum which has a somewhat mass-like appearance, highly suspicious for primary colonic neoplasm.  The remainder of the colon is otherwise unremarkable in appearance. Trace ascites.  Prominent lymph nodes in the ileocolic mesentery (example on image 96 of series 2 measuring 11 x 15 mm), concerning for nodal disease.  No pathologic distension of small bowel.  No pneumoperitoneum.  Atherosclerosis of the abdominal and pelvic vasculature, without definite aneurysm or dissection.  The prostate is mildly enlarged and heterogeneous in appearance.  Urinary bladder is unremarkable.  Musculoskeletal: Well defined lucent lesion with a narrow zone of transition and slight sclerosis of the margins in the posterior aspect of the right proximal femur is unchanged (this was not hypermetabolic on prior PET CT), and is presumably benign. There are no aggressive appearing lytic or blastic lesions noted in the visualized portions of the skeleton.  IMPRESSION:  1.  Large cecal mass with a ileocolic lymphadenopathy and diffuse hepatic metastatic disease.  The overall burden of disease in the liver appears very similar to the prior PET CT 11/21/2011. 2. Trace volume of ascites. 3.  Multiple left renal cysts redemonstrated. 4. Atherosclerosis.  These results were called by telephone on 02/28/2012 at 02:35 p.m. to Dr. Welton Flakes (covering for Dr. Arbutus Ped), who verbally acknowledged these results.   Original Report Authenticated By: Florencia Reasons, M.D.      1. Metastatic cancer   2. Mural thrombus of heart     I spoke with the cardiologist, on call, Dr. Jacinto Halim.  He recommended an outpatient.  TEE and consideration of Coumadin.  If the  oncologist.  Does not think the patient is at significant risk  of bleeding.  Dr. gone.  She said that he would contact the patient to arrange an outpatient.  TEE  I spoke with the oncologist on call.  She reviewed.  The patient's records and recommended that I do start Coumadin  MDM  Left ventricular  Thrombus No chest pain, shortness of breath, or any other symptoms        Cheri Guppy, MD 02/28/12 2057

## 2012-02-28 NOTE — ED Notes (Addendum)
Pt waiting on results of INR to confirm dosage of coumadin is correct.  Pt does not appears to be in any sort of distress at this time.

## 2012-02-28 NOTE — ED Notes (Signed)
Waiting for IV to come and de-access pt's port.

## 2012-02-28 NOTE — Telephone Encounter (Signed)
Spoke to patient regarding results of recent CT scan and the possibility of a blood clot in the heart, per verbal report of radiology. Avised that patient be seen in the ER at [facility] immediately for further evaluation. Wife demonstrated understanding and will take him to the ER immediately  Drue Second, MD Medical/Oncology [facility] [phone] (beeper) [phone] (Office)  02/28/2012, 2:44 PM

## 2012-02-29 ENCOUNTER — Telehealth: Payer: Self-pay | Admitting: Medical Oncology

## 2012-02-29 NOTE — Telephone Encounter (Signed)
Wife called to report pt was diagnosed with clot in left ventricle of heart after he had his CT  . He was seen in ED and started on coumadin. He has appt with Dr Rayvon Char on Monday morning at 0830. HSe is asking if he will be okay to wait until Monday before he sees MD.

## 2012-02-29 NOTE — Telephone Encounter (Signed)
Per [place] pt is to continue plan per recent ED visit and f/u with tests and md as scheduled. I instructed wife that if pt were to have sudden chest pain ,. SOB , trouble breathing then she needs to call 911. She voices understanding,

## 2012-03-03 ENCOUNTER — Ambulatory Visit: Payer: Medicare Other

## 2012-03-03 ENCOUNTER — Telehealth: Payer: Self-pay | Admitting: Medical Oncology

## 2012-03-03 ENCOUNTER — Other Ambulatory Visit: Payer: Medicare Other | Admitting: Lab

## 2012-03-03 NOTE — Telephone Encounter (Signed)
Pt had echocardiogram today.

## 2012-03-04 ENCOUNTER — Other Ambulatory Visit: Payer: Self-pay | Admitting: Medical Oncology

## 2012-03-04 ENCOUNTER — Telehealth: Payer: Self-pay | Admitting: Internal Medicine

## 2012-03-04 ENCOUNTER — Ambulatory Visit (HOSPITAL_BASED_OUTPATIENT_CLINIC_OR_DEPARTMENT_OTHER): Payer: Medicare Other

## 2012-03-04 ENCOUNTER — Ambulatory Visit (HOSPITAL_BASED_OUTPATIENT_CLINIC_OR_DEPARTMENT_OTHER): Payer: Medicare Other | Admitting: Internal Medicine

## 2012-03-04 ENCOUNTER — Other Ambulatory Visit (HOSPITAL_BASED_OUTPATIENT_CLINIC_OR_DEPARTMENT_OTHER): Payer: Medicare Other | Admitting: Lab

## 2012-03-04 VITALS — BP 142/71 | HR 76 | Temp 96.9°F | Resp 18 | Ht 71.0 in | Wt 150.9 lb

## 2012-03-04 DIAGNOSIS — C7800 Secondary malignant neoplasm of unspecified lung: Secondary | ICD-10-CM

## 2012-03-04 DIAGNOSIS — C787 Secondary malignant neoplasm of liver and intrahepatic bile duct: Secondary | ICD-10-CM

## 2012-03-04 DIAGNOSIS — C189 Malignant neoplasm of colon, unspecified: Secondary | ICD-10-CM

## 2012-03-04 DIAGNOSIS — C7951 Secondary malignant neoplasm of bone: Secondary | ICD-10-CM

## 2012-03-04 DIAGNOSIS — I219 Acute myocardial infarction, unspecified: Secondary | ICD-10-CM

## 2012-03-04 DIAGNOSIS — C180 Malignant neoplasm of cecum: Secondary | ICD-10-CM

## 2012-03-04 DIAGNOSIS — Z5111 Encounter for antineoplastic chemotherapy: Secondary | ICD-10-CM

## 2012-03-04 DIAGNOSIS — C801 Malignant (primary) neoplasm, unspecified: Secondary | ICD-10-CM

## 2012-03-04 DIAGNOSIS — C7949 Secondary malignant neoplasm of other parts of nervous system: Secondary | ICD-10-CM

## 2012-03-04 LAB — CBC WITH DIFFERENTIAL/PLATELET
BASO%: 0.6 % (ref 0.0–2.0)
Basophils Absolute: 0 10*3/uL (ref 0.0–0.1)
HCT: 36.4 % — ABNORMAL LOW (ref 38.4–49.9)
HGB: 11.6 g/dL — ABNORMAL LOW (ref 13.0–17.1)
LYMPH%: 18.1 % (ref 14.0–49.0)
MCH: 28.4 pg (ref 27.2–33.4)
MCHC: 31.9 g/dL — ABNORMAL LOW (ref 32.0–36.0)
MONO#: 0.4 10*3/uL (ref 0.1–0.9)
NEUT%: 67.6 % (ref 39.0–75.0)
Platelets: 115 10*3/uL — ABNORMAL LOW (ref 140–400)

## 2012-03-04 LAB — COMPREHENSIVE METABOLIC PANEL (CC13)
AST: 33 U/L (ref 5–34)
Albumin: 2.7 g/dL — ABNORMAL LOW (ref 3.5–5.0)
BUN: 17 mg/dL (ref 7.0–26.0)
Calcium: 8.7 mg/dL (ref 8.4–10.4)
Chloride: 107 mEq/L (ref 98–107)
Creatinine: 0.7 mg/dL (ref 0.7–1.3)
Glucose: 114 mg/dl — ABNORMAL HIGH (ref 70–99)

## 2012-03-04 LAB — CEA: CEA: 5.1 ng/mL — ABNORMAL HIGH (ref 0.0–5.0)

## 2012-03-04 LAB — UA PROTEIN, DIPSTICK - CHCC: Protein, ur: <30 mg/dL

## 2012-03-04 MED ORDER — OXALIPLATIN CHEMO INJECTION 100 MG/20ML
85.0000 mg/m2 | Freq: Once | INTRAVENOUS | Status: AC
  Administered 2012-03-04: 160 mg via INTRAVENOUS
  Filled 2012-03-04: qty 32

## 2012-03-04 MED ORDER — HEPARIN SOD (PORK) LOCK FLUSH 100 UNIT/ML IV SOLN
500.0000 [IU] | Freq: Once | INTRAVENOUS | Status: DC | PRN
  Filled 2012-03-04: qty 5

## 2012-03-04 MED ORDER — DEXAMETHASONE SODIUM PHOSPHATE 10 MG/ML IJ SOLN
10.0000 mg | Freq: Once | INTRAMUSCULAR | Status: AC
  Administered 2012-03-04: 10 mg via INTRAVENOUS

## 2012-03-04 MED ORDER — FLUOROURACIL CHEMO INJECTION 2.5 GM/50ML
400.0000 mg/m2 | Freq: Once | INTRAVENOUS | Status: AC
  Administered 2012-03-04: 750 mg via INTRAVENOUS
  Filled 2012-03-04: qty 15

## 2012-03-04 MED ORDER — ONDANSETRON 8 MG/50ML IVPB (CHCC)
8.0000 mg | Freq: Once | INTRAVENOUS | Status: AC
  Administered 2012-03-04: 8 mg via INTRAVENOUS

## 2012-03-04 MED ORDER — SODIUM CHLORIDE 0.9 % IV SOLN
Freq: Once | INTRAVENOUS | Status: AC
  Administered 2012-03-04: 12:00:00 via INTRAVENOUS

## 2012-03-04 MED ORDER — SODIUM CHLORIDE 0.9 % IV SOLN
2400.0000 mg/m2 | INTRAVENOUS | Status: DC
  Administered 2012-03-04: 4550 mg via INTRAVENOUS
  Filled 2012-03-04: qty 91

## 2012-03-04 MED ORDER — LEUCOVORIN CALCIUM INJECTION 350 MG
400.0000 mg/m2 | Freq: Once | INTRAVENOUS | Status: AC
  Administered 2012-03-04: 760 mg via INTRAVENOUS
  Filled 2012-03-04: qty 38

## 2012-03-04 MED ORDER — DEXTROSE 5 % IV SOLN
Freq: Once | INTRAVENOUS | Status: AC
  Administered 2012-03-04: 12:00:00 via INTRAVENOUS

## 2012-03-04 MED ORDER — SODIUM CHLORIDE 0.9 % IJ SOLN
10.0000 mL | INTRAMUSCULAR | Status: DC | PRN
  Filled 2012-03-04: qty 10

## 2012-03-04 MED ORDER — SODIUM CHLORIDE 0.9 % IV SOLN
5.0000 mg/kg | Freq: Once | INTRAVENOUS | Status: AC
  Administered 2012-03-04: 350 mg via INTRAVENOUS
  Filled 2012-03-04: qty 14

## 2012-03-04 NOTE — Progress Notes (Signed)
Pt tolerated treatment well.  Discharged to home with wife.  Instructed to return 9/19 1:30 pm for pump disconnect.

## 2012-03-04 NOTE — Telephone Encounter (Signed)
Gave pt appt for September and October 2013 lab, chemo and ML °

## 2012-03-04 NOTE — Patient Instructions (Signed)
[facility] Discharge Instructions for Patients Receiving Chemotherapy  Today you received the following chemotherapy agents: oxaliplatin, leucovorin, 5FU, avastin.  To help prevent nausea and vomiting after your treatment, we encourage you to take your nausea medication.  Take it as often as prescribed.     If you develop nausea and vomiting that is not controlled by your nausea medication, call the clinic. If it is after clinic hours your family physician or the after hours number for the clinic or go to the Emergency Department.   BELOW ARE SYMPTOMS THAT SHOULD BE REPORTED IMMEDIATELY:  *FEVER GREATER THAN 100.5 F  *CHILLS WITH OR WITHOUT FEVER  NAUSEA AND VOMITING THAT IS NOT CONTROLLED WITH YOUR NAUSEA MEDICATION  *UNUSUAL SHORTNESS OF BREATH  *UNUSUAL BRUISING OR BLEEDING  TENDERNESS IN MOUTH AND THROAT WITH OR WITHOUT PRESENCE OF ULCERS  *URINARY PROBLEMS  *BOWEL PROBLEMS  UNUSUAL RASH Items with * indicate a potential emergency and should be followed up as soon as possible.  Feel free to call the clinic you have any questions or concerns. The clinic phone number is [phone].   I have been informed and understand all the instructions given to me. I know to contact the clinic, my physician, or go to the Emergency Department if any problems should occur. I do not have any questions at this time, but understand that I may call the clinic during office hours   should I have any questions or need assistance in obtaining follow up care.    __________________________________________  _____________  __________ Signature of Patient or Authorized Representative            Date                   Time    __________________________________________ Nurse's Signature

## 2012-03-04 NOTE — Patient Instructions (Addendum)
You have no evidence for disease progression on his recent scan. There is questionable mural thrombus in the left ventricle. Continue Coumadin. We will continue chemotherapy as scheduled.

## 2012-03-04 NOTE — Telephone Encounter (Signed)
Added coumadin clinic appt for 9/19. S/w pt he is aware and will get new schedule then.

## 2012-03-04 NOTE — Progress Notes (Signed)
[facility] [phone]   Fax:([phone]  OFFICE PROGRESS NOTE  [name], FNP [address] [place] [address]  DIAGNOSIS:  1) Metastatic colon adenocarcinoma (KRAS mutation) with metastatic lesion to the brain, lung, liver and bone diagnosed in May of 2013. 2) left ventricular mural thrombus incidentally diagnosed on recent CT scan of the chest on 02/28/2012.  PRIOR THERAPY: Status post stereotactic radiosurgery to 2 brain lesions   CURRENT THERAPY:  1) Systemic chemotherapy with FOLFOX/Avastin status post 6 cycles. 2) Coumadin 5 mg by mouth daily for the recently diagnosed left ventricular mural thrombus.  INTERVAL HISTORY: Joseph Rowland 78 y.o. male returns to the clinic today for followup visit accompanied by his wife, daughter and daughter-in-law. The patient is feeling fine today with no specific complaints. He denied having any significant chest pain or shortness breath, no cough or hemoptysis. He denied having any significant weight loss or night sweats. He tolerated the last cycle of his systemic chemotherapy with FOLFOX and Avastin fairly well. The patient had a recent staging scan of the chest, abdomen and pelvis performed which showed incidental left ventricular mural thrombus. He was seen at the emergency Department at [facility]. He was started on Coumadin 5 mg by mouth daily and the patient was referred to Dr. Jacinto Halim, Cardiology for evaluation. He underwent echocardiogram yesterday but has not heard from [facility] yet about the results. The patient is here today for evaluation and discussion of his scan results and his treatment options for the colon cancer.  MEDICAL HISTORY: Past Medical History  Diagnosis Date  . Lung mass   . Liver cancer   . metastatic adenocarcinoma 11/14/11    liver bx=metastatic adenocarcino89ma with tumor necrosis kras  mutation and involving brain,lung,and bones  . Brain cancer    lesions in occipital lobes,b/l suspicious mets  . Lung cancer     b/l multifocal pulmonary mets  . Hypertension   . Anemia   . History of radiation therapy 12/07/11    SRS single fraction palliative 2 intracranial lesions  . History of chemotherapy 01/07/12    FOLFAX/AVASTIN s/p 3 cycles     ALLERGIES:   has no known allergies.  MEDICATIONS:  Current Outpatient Prescriptions  Medication Sig Dispense Refill  . Alum & Mag Hydroxide-Simeth (MAGIC MOUTHWASH) SOLN Swish and spit 5 mLs 4 (four) times daily.      Marland Kitchen aspirin EC 81 MG tablet Take 81 mg by mouth daily.      . cholecalciferol (VITAMIN D) 1000 UNITS tablet Take 1,000 Units by mouth daily.      . ferrous sulfate 325 (65 FE) MG tablet Take 325 mg by mouth 3 (three) times daily with meals.      . lidocaine-prilocaine (EMLA) cream Apply 1 application topically as needed. For port-a-cath access.      . Multiple Vitamin (MULITIVITAMIN) LIQD Take 10 mLs by mouth daily.      . potassium chloride SA (K-DUR,KLOR-CON) 20 MEQ tablet Take 20 mEq by mouth daily.      Marland Kitchen PRESCRIPTION MEDICATION Inject into the vein every 14 (fourteen) days. Avastin, Leucovorin, Adrucil, Eloxatin      . PROBIOTIC CAPS Take 2 capsules by mouth 3 (three) times daily.       Marland Kitchen warfarin (COUMADIN) 5 MG tablet Take 1 tablet (5 mg total) by mouth daily.  30 tablet  0  . albuterol (PROVENTIL) (5 MG/ML) 0.5% nebulizer solution Take 2.5 mg by  nebulization every 6 (six) hours as needed. For shortness of breath.      Marland Kitchen HYDROcodone-acetaminophen (LORTAB) 7.5-500 MG/15ML solution Take 7.5 mg by mouth Every 6 hours as needed.      . ondansetron (ZOFRAN) 4 MG tablet Take 4 mg by mouth Twice daily as needed.        SURGICAL HISTORY:  Past Surgical History  Procedure Date  . Hernia repair   . Right port a cath     power port right subclavian  . Colonoscopy 12/06/2011    Procedure: COLONOSCOPY;  Surgeon: Louis Meckel, MD;  Location: [facility] ENDOSCOPY;  Service: Endoscopy;   Laterality: N/A;  . Colonoscopy 12/06/2011    Procedure: COLONOSCOPY;  Surgeon: Louis Meckel, MD;  Location: [facility] ENDOSCOPY;  Service: Endoscopy;  Laterality: N/A;    REVIEW OF SYSTEMS:  A comprehensive review of systems was negative except for: Constitutional: positive for fatigue   PHYSICAL EXAMINATION: General appearance: alert, cooperative and no distress Head: Normocephalic, without obvious abnormality, atraumatic Neck: no adenopathy Lymph nodes: Cervical, supraclavicular, and axillary nodes normal. Resp: clear to auscultation bilaterally Back: negative Cardio: regular rate and rhythm, S1, S2 normal, no murmur, click, rub or gallop GI: soft, non-tender; bowel sounds normal; no masses,  no organomegaly Extremities: extremities normal, atraumatic, no cyanosis or edema Neurologic: Alert and oriented X 3, normal strength and tone. Normal symmetric reflexes. Normal coordination and gait  ECOG PERFORMANCE STATUS: 1 - Symptomatic but completely ambulatory  Blood pressure 142/71, pulse 76, temperature 96.9 F (36.1 C), temperature source Oral, resp. rate 18, height 5\' 11"  (1.803 m), weight 150 lb 14.4 oz (68.448 kg).  LABORATORY DATA: Lab Results  Component Value Date   WBC 3.5* 03/04/2012   HGB 11.6* 03/04/2012   HCT 36.4* 03/04/2012   MCV 89.2 03/04/2012   PLT 115 Occ Large & giant platelets* 03/04/2012      Chemistry      Component Value Date/Time   NA 134* 02/28/2012 1638   NA 141 02/19/2012 1121   K 3.9 02/28/2012 1638   K 3.9 02/19/2012 1121   CL 100 02/28/2012 1638   CL 108* 02/19/2012 1121   CO2 27 02/28/2012 1638   CO2 24 02/19/2012 1121   BUN 11 02/28/2012 1638   BUN 15.0 02/19/2012 1121   CREATININE 0.90 02/28/2012 1638   CREATININE 0.8 02/19/2012 1121      Component Value Date/Time   CALCIUM 9.2 02/28/2012 1638   ALKPHOS 164* 02/19/2012 1121   ALKPHOS 153* 02/04/2012 1034   AST 39* 02/19/2012 1121   AST 36 02/04/2012 1034   ALT 26 02/19/2012 1121   ALT 22 02/04/2012 1034   BILITOT  0.40 02/19/2012 1121   BILITOT 0.3 02/04/2012 1034       RADIOGRAPHIC STUDIES: Dg Chest 2 View  02/28/2012  *RADIOLOGY REPORT*  Clinical Data: Shortness of breath.  CHEST - 2 VIEW  Comparison: CT chest performed earlier today.  Findings: Cardiomegaly.  Multiple metastatic lesions throughout the chest.  Port-A-Cath good position.  No active infiltrates or failure.  Mild scoliosis convex right without visible compression fracture.  IMPRESSION: No active infiltrates or failure.  Multiple lung metastases. Cardiomegaly.   Original Report Authenticated By: Elsie Stain, M.D.    Ct Chest W Contrast  02/28/2012  *RADIOLOGY REPORT*  Clinical Data:  History of metastatic colon cancer.  Chemotherapy ongoing.  CT CHEST, ABDOMEN AND PELVIS WITH CONTRAST  Technique:  Multidetector CT imaging of the chest, abdomen and pelvis  was performed following the standard protocol during bolus administration of intravenous contrast.  Contrast: OMNIPAQUE IOHEXOL 300 MG/ML  SOLN  Comparison:  Chest CT 11/02/2011.  PET CT 11/21/2011.   CT CHEST  Findings:  Mediastinum: Heart size is to moderately enlarged with left ventricular dilatation.  Notably, there are two small filling defects within the apex of the left ventricle best demonstrated on images 54 and 55 of series 2, which are new compared to the prior examination from 11/02/2011, and highly suspicious for intracardiac thrombus (less likely to represent foci of metastatic disease). There is no significant pericardial fluid, thickening or pericardial calcification. There is atherosclerosis of the thoracic aorta, the great vessels of the mediastinum and the coronary arteries, including calcified atherosclerotic plaque in the left main, left anterior descending, diagonal one, ramus intermedius, left circumflex and right coronary arteries. . No pathologically enlarged mediastinal or hilar lymph nodes. Borderline enlarged superior right paratracheal lymph node measuring 9 mm is  similar to prior examination and nonspecific.  Esophagus is unremarkable in appearance. Right internal jugular single lumen Port-A-Cath with tip terminating at the superior cavoatrial junction.  Lungs/Pleura: Trace right-sided pleural effusion has decreased. Previously noted left-sided pleural effusion has completely resolved.  Large right lower lobe pulmonary nodule (image 49 of series 5) has slightly decreased in size measuring 2.7 x 2.4 cm on today's examination.  Pleural-based left lower lobe nodule (image 39 of series 5) is very similar to the prior study measuring 2.1 x 1.4 cm on today's examination.  1.3 cm left upper lobe nodule (image 28 series 5) is unchanged.  5 mm subpleural nodule in the medial aspect of the lingula (image 35 of series 5) is unchanged. 11 mm nodule in the medial basal segment of the right lower lobe (image 44 of series 5) is unchanged.  No other definite new pulmonary nodules are noted.  No acute consolidative airspace disease.  Musculoskeletal: There are no aggressive appearing lytic or blastic lesions noted in the visualized portions of the skeleton.  IMPRESSION:  1.  Slight decrease in size in the largest pulmonary nodule within the right lower lobe, which currently measures 2.7 x 2.4 cm.  There has also been resolution of the left pleural effusion and decreased size of what is now a trace right pleural effusion.  Metastatic disease to the lungs is otherwise very similar to the prior study, as above. 2.  There are two new small filling defects in the left ventricular apex, highly concerning for left ventricular apical thrombus (less likely to represent metastatic disease to the myocardium).  This places the patient at risk for systemic embolization.  Clinical correlation is recommended. 3. Atherosclerosis, including left main and multivessel coronary artery disease. 4.  Cardiomegaly with left ventricular dilatation. 5.  Additional incidental findings, as above.   CT ABDOMEN AND  PELVIS  Findings:  Abdomen/Pelvis: There are numerous nodular and mass-like hypovascular lesions scattered throughout the liver parenchyma which appear very similar in size, number and distribution compared to the noncontrast CT portion of the PET examination from 11/21/2011.  Exact size comparison between the examinations is not possible secondary to differences in technique.  A specific eight samples including a 804.6 x 3.5 cm mass in the inferior aspect of segment six (image 76 of series 2), and a 3.6 x 2.6 cm mass in segment for a (image 58 of series 2).  The appearance of the gallbladder, spleen, bilateral adrenal glands and the right kidney is unremarkable.  Numerous low-attenuation lesions  in the left kidney are compatible with cysts, the largest of which extends exophytically from the lower pole of the left kidney measuring 5.1 cm in diameter.  Marked thickening of the cecum which has a somewhat mass-like appearance, highly suspicious for primary colonic neoplasm.  The remainder of the colon is otherwise unremarkable in appearance. Trace ascites.  Prominent lymph nodes in the ileocolic mesentery (example on image 96 of series 2 measuring 11 x 15 mm), concerning for nodal disease.  No pathologic distension of small bowel.  No pneumoperitoneum.  Atherosclerosis of the abdominal and pelvic vasculature, without definite aneurysm or dissection.  The prostate is mildly enlarged and heterogeneous in appearance.  Urinary bladder is unremarkable.  Musculoskeletal: Well defined lucent lesion with a narrow zone of transition and slight sclerosis of the margins in the posterior aspect of the right proximal femur is unchanged (this was not hypermetabolic on prior PET CT), and is presumably benign. There are no aggressive appearing lytic or blastic lesions noted in the visualized portions of the skeleton.  IMPRESSION:  1.  Large cecal mass with a ileocolic lymphadenopathy and diffuse hepatic metastatic disease.  The  overall burden of disease in the liver appears very similar to the prior PET CT 11/21/2011. 2. Trace volume of ascites. 3.  Multiple left renal cysts redemonstrated. 4. Atherosclerosis.  These results were called by telephone on 02/28/2012 at 02:35 p.m. to Dr. Welton Flakes (covering for Dr. Arbutus Ped), who verbally acknowledged these results.   Original Report Authenticated By: Florencia Reasons, M.D.      ASSESSMENT: This is a very pleasant 78 years old white male with metastatic colon adenocarcinoma currently undergoing systemic chemotherapy with FOLFOX and Avastin status post 6 cycles with no evidence for disease progression. The patient has incidental finding of left ventricular mural thrombus on his recent scan.  PLAN: I have a lengthy discussion with the patient and his family today about his current disease status and treatment options. I recommended for him to continue on the same systemic chemotherapy with FOLFOX and Avastin. The patient will receive cycle #7 today. For the left ventricular mural thrombus the patient will continue on Coumadin and his dose will be adjusted by the Coumadin clinic at the cancer Center. He will follow with Dr. Jacinto Halim regarding any further treatment for the left ventricular thrombus. The patient would come back for followup visit in 2 weeks for evaluation before starting the next cycle of his chemotherapy.  All questions were answered. The patient knows to call the clinic with any problems, questions or concerns. We can certainly see the patient much sooner if necessary.  I spent 20 minutes counseling the patient face to face. The total time spent in the appointment was 30 minutes.

## 2012-03-04 NOTE — Telephone Encounter (Signed)
Called in refill for zofran. 

## 2012-03-05 ENCOUNTER — Telehealth: Payer: Self-pay | Admitting: Medical Oncology

## 2012-03-05 ENCOUNTER — Other Ambulatory Visit: Payer: Self-pay | Admitting: Pharmacist

## 2012-03-05 DIAGNOSIS — I513 Intracardiac thrombosis, not elsewhere classified: Secondary | ICD-10-CM | POA: Insufficient documentation

## 2012-03-05 NOTE — Telephone Encounter (Signed)
Confirmed coumadin clinic appt and lab prior

## 2012-03-06 ENCOUNTER — Ambulatory Visit (HOSPITAL_BASED_OUTPATIENT_CLINIC_OR_DEPARTMENT_OTHER): Payer: Medicare Other | Admitting: Pharmacist

## 2012-03-06 ENCOUNTER — Ambulatory Visit (HOSPITAL_BASED_OUTPATIENT_CLINIC_OR_DEPARTMENT_OTHER): Payer: Medicare Other

## 2012-03-06 ENCOUNTER — Ambulatory Visit: Payer: Medicare Other | Admitting: Lab

## 2012-03-06 VITALS — BP 135/80 | HR 92 | Temp 97.7°F

## 2012-03-06 DIAGNOSIS — C7800 Secondary malignant neoplasm of unspecified lung: Secondary | ICD-10-CM

## 2012-03-06 DIAGNOSIS — Z452 Encounter for adjustment and management of vascular access device: Secondary | ICD-10-CM

## 2012-03-06 DIAGNOSIS — C180 Malignant neoplasm of cecum: Secondary | ICD-10-CM

## 2012-03-06 DIAGNOSIS — C7949 Secondary malignant neoplasm of other parts of nervous system: Secondary | ICD-10-CM

## 2012-03-06 DIAGNOSIS — I219 Acute myocardial infarction, unspecified: Secondary | ICD-10-CM

## 2012-03-06 DIAGNOSIS — I513 Intracardiac thrombosis, not elsewhere classified: Secondary | ICD-10-CM

## 2012-03-06 DIAGNOSIS — C189 Malignant neoplasm of colon, unspecified: Secondary | ICD-10-CM

## 2012-03-06 LAB — PROTIME-INR
INR: 3 (ref 2.00–3.50)
Protime: 36 Seconds — ABNORMAL HIGH (ref 10.6–13.4)

## 2012-03-06 MED ORDER — SODIUM CHLORIDE 0.9 % IJ SOLN
10.0000 mL | INTRAMUSCULAR | Status: DC | PRN
  Administered 2012-03-06: 10 mL
  Filled 2012-03-06: qty 10

## 2012-03-06 MED ORDER — HEPARIN SOD (PORK) LOCK FLUSH 100 UNIT/ML IV SOLN
500.0000 [IU] | Freq: Once | INTRAVENOUS | Status: AC | PRN
  Administered 2012-03-06: 500 [IU]
  Filled 2012-03-06: qty 5

## 2012-03-06 NOTE — Progress Notes (Signed)
  A full discussion of the nature of anticoagulants has been carried out. They understand the justification for choosing anticoagulation at this time. The need for frequent and regular monitoring, precise dosage adjustment and compliance is stressed. Side effects of potential bleeding were reviewed. We discussed the importance of making all providers involved in his care aware that he is on Coumadin.  Pt vary rarely eats any vegetables or anything green.  I informed him to avoid great changes in general diet. He doesn't drink alcohol and doesn't use any OTC products or herbal products not already listed on his medication list.  Continue 5mg  daily. Recheck INR at already scheduled lab apt on 03/11/12 @ 8:15am and Coumadin Clinic = 8:30am.

## 2012-03-06 NOTE — Patient Instructions (Signed)
Continue 5mg  daily.  Recheck INR at already scheduled lab apt on 03/11/12 @ 8:15am and Coumadin Clinic = 8:30am

## 2012-03-06 NOTE — Patient Instructions (Signed)
Call MD for problems 

## 2012-03-11 ENCOUNTER — Other Ambulatory Visit (HOSPITAL_BASED_OUTPATIENT_CLINIC_OR_DEPARTMENT_OTHER): Payer: Medicare Other | Admitting: Lab

## 2012-03-11 ENCOUNTER — Ambulatory Visit (HOSPITAL_BASED_OUTPATIENT_CLINIC_OR_DEPARTMENT_OTHER): Payer: Medicare Other | Admitting: Pharmacist

## 2012-03-11 DIAGNOSIS — Z7901 Long term (current) use of anticoagulants: Secondary | ICD-10-CM

## 2012-03-11 DIAGNOSIS — I513 Intracardiac thrombosis, not elsewhere classified: Secondary | ICD-10-CM

## 2012-03-11 DIAGNOSIS — C189 Malignant neoplasm of colon, unspecified: Secondary | ICD-10-CM

## 2012-03-11 DIAGNOSIS — I219 Acute myocardial infarction, unspecified: Secondary | ICD-10-CM

## 2012-03-11 LAB — COMPREHENSIVE METABOLIC PANEL (CC13)
ALT: 25 U/L (ref 0–55)
AST: 37 U/L — ABNORMAL HIGH (ref 5–34)
Albumin: 3 g/dL — ABNORMAL LOW (ref 3.5–5.0)
Calcium: 9.4 mg/dL (ref 8.4–10.4)
Chloride: 106 mEq/L (ref 98–107)
Potassium: 4.4 mEq/L (ref 3.5–5.1)
Sodium: 139 mEq/L (ref 136–145)
Total Protein: 6.4 g/dL (ref 6.4–8.3)

## 2012-03-11 LAB — PROTIME-INR: INR: 4.2 — ABNORMAL HIGH (ref 2.00–3.50)

## 2012-03-11 LAB — CBC WITH DIFFERENTIAL/PLATELET
Basophils Absolute: 0 10*3/uL (ref 0.0–0.1)
EOS%: 3.3 % (ref 0.0–7.0)
Eosinophils Absolute: 0.1 10*3/uL (ref 0.0–0.5)
HCT: 37.8 % — ABNORMAL LOW (ref 38.4–49.9)
HGB: 12.2 g/dL — ABNORMAL LOW (ref 13.0–17.1)
MCH: 28.9 pg (ref 27.2–33.4)
MONO#: 0.2 10*3/uL (ref 0.1–0.9)
NEUT%: 30.4 % — ABNORMAL LOW (ref 39.0–75.0)
lymph#: 1 10*3/uL (ref 0.9–3.3)

## 2012-03-12 ENCOUNTER — Other Ambulatory Visit: Payer: Medicare Other | Admitting: Lab

## 2012-03-12 NOTE — Patient Instructions (Addendum)
Hold Coumadin x 2 days.  On 03/13/12, restart coumadin at 5mg  (1 tablet) daily except 2.5mg  (1/2 tablet) on Sundays and Thursdays.  Recheck INR with next scheduled lab, apt with Adrena and treatment on 03/18/12.

## 2012-03-12 NOTE — Progress Notes (Signed)
INR likely elevated secondary to drug interaction with 5FU. Pt received 5FU pump 9/17-9/19. Pt is also receiving Avastin. Pltc = 108 today. Coumadin started on 02/28/12 with therapeutic INR on 03/06/12. Will hold Coumadin x 2 days.  On 03/13/12, restart coumadin at 5mg  (1 tablet) daily except 2.5mg  (1/2 tablet) on Sundays and Thursdays.  Recheck INR with next scheduled lab, apt with Adrena and treatment scheduled on 03/18/12.  We may need consider a reduced Coumadin dose on days 1-6 after receiving the 5FU pump (ex : 2.5mg  daily except 5mg  on MWF, or similar dose, the week following chemo) and Coumadin 5mg  daily on days 7-14. Coumadin 5mg  daily resulted in therapeutic INR when given on ~ days 7-14.

## 2012-03-18 ENCOUNTER — Ambulatory Visit: Payer: Medicare Other | Admitting: Internal Medicine

## 2012-03-18 ENCOUNTER — Ambulatory Visit (HOSPITAL_BASED_OUTPATIENT_CLINIC_OR_DEPARTMENT_OTHER): Payer: Medicare Other | Admitting: Physician Assistant

## 2012-03-18 ENCOUNTER — Other Ambulatory Visit: Payer: Medicare Other

## 2012-03-18 ENCOUNTER — Ambulatory Visit: Payer: Medicare Other | Admitting: Pharmacist

## 2012-03-18 ENCOUNTER — Other Ambulatory Visit (HOSPITAL_BASED_OUTPATIENT_CLINIC_OR_DEPARTMENT_OTHER): Payer: Medicare Other | Admitting: Lab

## 2012-03-18 ENCOUNTER — Inpatient Hospital Stay: Payer: Medicare Other

## 2012-03-18 ENCOUNTER — Other Ambulatory Visit (HOSPITAL_COMMUNITY): Payer: Medicare Other

## 2012-03-18 VITALS — BP 147/86 | HR 89 | Temp 97.6°F | Resp 18 | Ht 71.0 in | Wt 145.7 lb

## 2012-03-18 DIAGNOSIS — I219 Acute myocardial infarction, unspecified: Secondary | ICD-10-CM

## 2012-03-18 DIAGNOSIS — Z5111 Encounter for antineoplastic chemotherapy: Secondary | ICD-10-CM

## 2012-03-18 DIAGNOSIS — C787 Secondary malignant neoplasm of liver and intrahepatic bile duct: Secondary | ICD-10-CM

## 2012-03-18 DIAGNOSIS — C7951 Secondary malignant neoplasm of bone: Secondary | ICD-10-CM

## 2012-03-18 DIAGNOSIS — C189 Malignant neoplasm of colon, unspecified: Secondary | ICD-10-CM

## 2012-03-18 DIAGNOSIS — I513 Intracardiac thrombosis, not elsewhere classified: Secondary | ICD-10-CM

## 2012-03-18 DIAGNOSIS — C180 Malignant neoplasm of cecum: Secondary | ICD-10-CM

## 2012-03-18 LAB — CBC WITH DIFFERENTIAL/PLATELET
EOS%: 1.1 % (ref 0.0–7.0)
LYMPH%: 33.7 % (ref 14.0–49.0)
MCH: 29.9 pg (ref 27.2–33.4)
MCV: 92.5 fL (ref 79.3–98.0)
MONO%: 30.7 % — ABNORMAL HIGH (ref 0.0–14.0)
RBC: 4.12 10*6/uL — ABNORMAL LOW (ref 4.20–5.82)
RDW: 29.7 % — ABNORMAL HIGH (ref 11.0–14.6)
nRBC: 0 % (ref 0–0)

## 2012-03-18 LAB — PROTIME-INR: Protime: 30 Seconds — ABNORMAL HIGH (ref 10.6–13.4)

## 2012-03-18 NOTE — Patient Instructions (Addendum)
Your Neutrophil count is too low to proceed with chemotherapy today. We will postpone your chemotherapy to next week. Follow up with Dr. Arbutus Ped in 3 weeks prior to your next cycle of chemotherapy. Contact your cardiologist regarding your concerns with the medications he prescribed.

## 2012-03-18 NOTE — Progress Notes (Signed)
Pt has questions about medications Cardiologists prescribed (dig, lisinipril and carvedilol).  Pt has not started them as cardiologist was not in agreement with images of seeing blood clot.  Joseph Bible is hesitant to start 3 new BP meds when his pressure is already normal.  Pt is not being treated today.  Kept coumadin dose on 5mg  daily with 2.5 mg on Sun and Thur.  We will recheck INR next week with FU labs on Tue 10/8 at 8:15 for lab and cc at 8:30 or in infusion is pt is treated.  May need to adjust coumadin if pt is treated.  According to previous cc notes INR tends to rise when pt is on pump

## 2012-03-18 NOTE — Patient Instructions (Signed)
Continue 5 mg daily with 2.5 mg on Thur and Sun.  Recheck INR next week with lab appmt (03/25/12 at 8:15).

## 2012-03-19 ENCOUNTER — Telehealth: Payer: Self-pay | Admitting: Internal Medicine

## 2012-03-19 NOTE — Telephone Encounter (Signed)
Called wife with 10/8 appt info

## 2012-03-20 ENCOUNTER — Ambulatory Visit: Payer: Medicare Other | Admitting: Internal Medicine

## 2012-03-20 NOTE — Progress Notes (Signed)
[facility] [phone]   [phone]  OFFICE PROGRESS NOTE  [name], FNP [address] [place] [address]  DIAGNOSIS:  1) Metastatic colon adenocarcinoma (KRAS mutation) with metastatic lesion to the brain, lung, liver and bone diagnosed in May of 2013. 2) left ventricular mural thrombus incidentally diagnosed on recent CT scan of the chest on 02/28/2012.  PRIOR THERAPY: Status post stereotactic radiosurgery to 2 brain lesions   CURRENT THERAPY:  1) Systemic chemotherapy with FOLFOX/Avastin status post 7 cycles. 2) Coumadin 5 mg by mouth daily for the recently diagnosed left ventricular mural thrombus.  INTERVAL HISTORY: Joseph Rowland 78 y.o. male returns to the clinic today for followup visit accompanied by his wife. The patient was recently evaluated by Dr.Ganji at [facility] in reference to his mural thrombus of the heart. He was given prescriptions for her digoxin, lisinopril, and carvedilol. He's got these prescriptions filled but has not started them as yet. HEENT his wife are concerned about the side effects listed for all of these medications. His blood pressure has been normal range and he has concerns that these medications may drop his blood pressure too low. Further cardiovascular studies have been ordered. He presents today to proceed with cycle #8 of his systemic chemotherapy with FOLFOX plus Avastin. Overall he is been tolerating his chemotherapy relatively well. He voices no specific complaints today. Marland Kitchen He denied having any significant chest pain or shortness breath, no cough or hemoptysis. He denied having any significant weight loss or night sweats. He tolerated the last cycle of his systemic chemotherapy with FOLFOX and Avastin fairly well. He does report some mild fatigue.  MEDICAL HISTORY: Past Medical History  Diagnosis Date  . Lung mass   . Liver cancer   . metastatic adenocarcinoma 11/14/11      liver bx=metastatic adenocarcino61ma with tumor necrosis kras  mutation and involving brain,lung,and bones  . Brain cancer     lesions in occipital lobes,b/l suspicious mets  . Lung cancer     b/l multifocal pulmonary mets  . Hypertension   . Anemia   . History of radiation therapy 12/07/11    SRS single fraction palliative 2 intracranial lesions  . History of chemotherapy 01/07/12    FOLFAX/AVASTIN s/p 3 cycles     ALLERGIES:   has no known allergies.  MEDICATIONS:  Current Outpatient Prescriptions  Medication Sig Dispense Refill  . Alum & Mag Hydroxide-Simeth (MAGIC MOUTHWASH) SOLN Swish and spit 5 mLs 4 (four) times daily.      Marland Kitchen aspirin EC 81 MG tablet Take 81 mg by mouth daily.      . carvedilol (COREG) 3.125 MG tablet       . cholecalciferol (VITAMIN D) 1000 UNITS tablet Take 1,000 Units by mouth daily.      . digoxin (LANOXIN) 0.125 MG tablet       . ferrous sulfate 325 (65 FE) MG tablet Take 325 mg by mouth daily.       Marland Kitchen HYDROcodone-acetaminophen (LORTAB) 7.5-500 MG/15ML solution Take 7.5 mg by mouth Every 6 hours as needed.      . lidocaine-prilocaine (EMLA) cream Apply 1 application topically as needed. For port-a-cath access.      Marland Kitchen lisinopril (PRINIVIL,ZESTRIL) 5 MG tablet       . Multiple Vitamin (MULITIVITAMIN) LIQD Take 10 mLs by mouth daily. Pt uses Vitawave.      . ondansetron (ZOFRAN) 4 MG tablet Take 4 mg by mouth  Twice daily as needed.      . potassium chloride SA (K-DUR,KLOR-CON) 20 MEQ tablet Take 20 mEq by mouth daily.      Marland Kitchen PRESCRIPTION MEDICATION Inject into the vein every 14 (fourteen) days. Avastin, Leucovorin, Adrucil, Eloxatin      . warfarin (COUMADIN) 5 MG tablet Take 1 tablet (5 mg total) by mouth daily.  30 tablet  0    SURGICAL HISTORY:  Past Surgical History  Procedure Date  . Hernia repair   . Right port a cath     power port right subclavian  . Colonoscopy 12/06/2011    Procedure: COLONOSCOPY;  Surgeon: Louis Meckel, MD;  Location: [facility]  ENDOSCOPY;  Service: Endoscopy;  Laterality: N/A;  . Colonoscopy 12/06/2011    Procedure: COLONOSCOPY;  Surgeon: Louis Meckel, MD;  Location: [facility] ENDOSCOPY;  Service: Endoscopy;  Laterality: N/A;    REVIEW OF SYSTEMS:  A comprehensive review of systems was negative except for: Constitutional: positive for fatigue   PHYSICAL EXAMINATION: General appearance: alert, cooperative and no distress Head: Normocephalic, without obvious abnormality, atraumatic Neck: no adenopathy Lymph nodes: Cervical, supraclavicular, and axillary nodes normal. Resp: clear to auscultation bilaterally Back: negative Cardio: regular rate and rhythm, S1, S2 normal, no murmur, click, rub or gallop GI: soft, non-tender; bowel sounds normal; no masses,  no organomegaly Extremities: extremities normal, atraumatic, no cyanosis or edema Neurologic: Alert and oriented X 3, normal strength and tone. Normal symmetric reflexes. Normal coordination and gait  ECOG PERFORMANCE STATUS: 1 - Symptomatic but completely ambulatory  Blood pressure 147/86, pulse 89, temperature 97.6 F (36.4 C), temperature source Oral, resp. rate 18, height 5\' 11"  (1.803 m), weight 145 lb 11.2 oz (66.089 kg).  LABORATORY DATA: Lab Results  Component Value Date   WBC 2.6* 03/18/2012   HGB 12.3* 03/18/2012   HCT 38.1* 03/18/2012   MCV 92.5 03/18/2012   PLT 120* 03/18/2012      Chemistry      Component Value Date/Time   NA 139 03/11/2012 0851   NA 134* 02/28/2012 1638   K 4.4 03/11/2012 0851   K 3.9 02/28/2012 1638   CL 106 03/11/2012 0851   CL 100 02/28/2012 1638   CO2 23 03/11/2012 0851   CO2 27 02/28/2012 1638   BUN 18.0 03/11/2012 0851   BUN 11 02/28/2012 1638   CREATININE 0.8 03/11/2012 0851   CREATININE 0.90 02/28/2012 1638      Component Value Date/Time   CALCIUM 9.4 03/11/2012 0851   CALCIUM 9.2 02/28/2012 1638   ALKPHOS 143 03/11/2012 0851   ALKPHOS 153* 02/04/2012 1034   AST 37* 03/11/2012 0851   AST 36 02/04/2012 1034   ALT 25 03/11/2012  0851   ALT 22 02/04/2012 1034   BILITOT 0.50 03/11/2012 0851   BILITOT 0.3 02/04/2012 1034       RADIOGRAPHIC STUDIES: Dg Chest 2 View  02/28/2012  *RADIOLOGY REPORT*  Clinical Data: Shortness of breath.  CHEST - 2 VIEW  Comparison: CT chest performed earlier today.  Findings: Cardiomegaly.  Multiple metastatic lesions throughout the chest.  Port-A-Cath good position.  No active infiltrates or failure.  Mild scoliosis convex right without visible compression fracture.  IMPRESSION: No active infiltrates or failure.  Multiple lung metastases. Cardiomegaly.   Original Report Authenticated By: Elsie Stain, M.D.    Ct Chest W Contrast  02/28/2012  *RADIOLOGY REPORT*  Clinical Data:  History of metastatic colon cancer.  Chemotherapy ongoing.  CT CHEST, ABDOMEN AND PELVIS WITH  CONTRAST  Technique:  Multidetector CT imaging of the chest, abdomen and pelvis was performed following the standard protocol during bolus administration of intravenous contrast.  Contrast: OMNIPAQUE IOHEXOL 300 MG/ML  SOLN  Comparison:  Chest CT 11/02/2011.  PET CT 11/21/2011.   CT CHEST  Findings:  Mediastinum: Heart size is to moderately enlarged with left ventricular dilatation.  Notably, there are two small filling defects within the apex of the left ventricle best demonstrated on images 54 and 55 of series 2, which are new compared to the prior examination from 11/02/2011, and highly suspicious for intracardiac thrombus (less likely to represent foci of metastatic disease). There is no significant pericardial fluid, thickening or pericardial calcification. There is atherosclerosis of the thoracic aorta, the great vessels of the mediastinum and the coronary arteries, including calcified atherosclerotic plaque in the left main, left anterior descending, diagonal one, ramus intermedius, left circumflex and right coronary arteries. . No pathologically enlarged mediastinal or hilar lymph nodes. Borderline enlarged superior right  paratracheal lymph node measuring 9 mm is similar to prior examination and nonspecific.  Esophagus is unremarkable in appearance. Right internal jugular single lumen Port-A-Cath with tip terminating at the superior cavoatrial junction.  Lungs/Pleura: Trace right-sided pleural effusion has decreased. Previously noted left-sided pleural effusion has completely resolved.  Large right lower lobe pulmonary nodule (image 49 of series 5) has slightly decreased in size measuring 2.7 x 2.4 cm on today's examination.  Pleural-based left lower lobe nodule (image 39 of series 5) is very similar to the prior study measuring 2.1 x 1.4 cm on today's examination.  1.3 cm left upper lobe nodule (image 28 series 5) is unchanged.  5 mm subpleural nodule in the medial aspect of the lingula (image 35 of series 5) is unchanged. 11 mm nodule in the medial basal segment of the right lower lobe (image 44 of series 5) is unchanged.  No other definite new pulmonary nodules are noted.  No acute consolidative airspace disease.  Musculoskeletal: There are no aggressive appearing lytic or blastic lesions noted in the visualized portions of the skeleton.  IMPRESSION:  1.  Slight decrease in size in the largest pulmonary nodule within the right lower lobe, which currently measures 2.7 x 2.4 cm.  There has also been resolution of the left pleural effusion and decreased size of what is now a trace right pleural effusion.  Metastatic disease to the lungs is otherwise very similar to the prior study, as above. 2.  There are two new small filling defects in the left ventricular apex, highly concerning for left ventricular apical thrombus (less likely to represent metastatic disease to the myocardium).  This places the patient at risk for systemic embolization.  Clinical correlation is recommended. 3. Atherosclerosis, including left main and multivessel coronary artery disease. 4.  Cardiomegaly with left ventricular dilatation. 5.  Additional incidental  findings, as above.   CT ABDOMEN AND PELVIS  Findings:  Abdomen/Pelvis: There are numerous nodular and mass-like hypovascular lesions scattered throughout the liver parenchyma which appear very similar in size, number and distribution compared to the noncontrast CT portion of the PET examination from 11/21/2011.  Exact size comparison between the examinations is not possible secondary to differences in technique.  A specific eight samples including a 804.6 x 3.5 cm mass in the inferior aspect of segment six (image 76 of series 2), and a 3.6 x 2.6 cm mass in segment for a (image 58 of series 2).  The appearance of the gallbladder, spleen,  bilateral adrenal glands and the right kidney is unremarkable.  Numerous low-attenuation lesions in the left kidney are compatible with cysts, the largest of which extends exophytically from the lower pole of the left kidney measuring 5.1 cm in diameter.  Marked thickening of the cecum which has a somewhat mass-like appearance, highly suspicious for primary colonic neoplasm.  The remainder of the colon is otherwise unremarkable in appearance. Trace ascites.  Prominent lymph nodes in the ileocolic mesentery (example on image 96 of series 2 measuring 11 x 15 mm), concerning for nodal disease.  No pathologic distension of small bowel.  No pneumoperitoneum.  Atherosclerosis of the abdominal and pelvic vasculature, without definite aneurysm or dissection.  The prostate is mildly enlarged and heterogeneous in appearance.  Urinary bladder is unremarkable.  Musculoskeletal: Well defined lucent lesion with a narrow zone of transition and slight sclerosis of the margins in the posterior aspect of the right proximal femur is unchanged (this was not hypermetabolic on prior PET CT), and is presumably benign. There are no aggressive appearing lytic or blastic lesions noted in the visualized portions of the skeleton.  IMPRESSION:  1.  Large cecal mass with a ileocolic lymphadenopathy and diffuse  hepatic metastatic disease.  The overall burden of disease in the liver appears very similar to the prior PET CT 11/21/2011. 2. Trace volume of ascites. 3.  Multiple left renal cysts redemonstrated. 4. Atherosclerosis.  These results were called by telephone on 02/28/2012 at 02:35 p.m. to Dr. Welton Flakes (covering for Dr. Arbutus Ped), who verbally acknowledged these results.   Original Report Authenticated By: Florencia Reasons, M.D.      ASSESSMENT/PLAN: This is a very pleasant 78 years old white male with metastatic colon adenocarcinoma currently undergoing systemic chemotherapy with FOLFOX and Avastin status post 6 cycles with no evidence for disease progression. The patient has incidental finding of left ventricular mural thrombus on his recent scan. Patient was discussed with Dr. Arbutus Ped. His ANC of 0.9 his son therapeutic to proceed with chemotherapy as scheduled today, therefore his chemotherapy will be postponed and rescheduled for 1 week later. He is advised to contact Dr. Verl Dicker  office regarding his concerns about the medications that he is prescribed for him. He is encouraged to complete the cardiac evaluation as ordered by Dr.Ganji. He will followup with Dr. Arbutus Ped in 3 weeks prior to his next scheduled cycle of chemotherapy with FOLFOX and Avastin with a CBC differential, C. met and urine protein dipstick. He is on Coumadin therapy and this is managed by the [facility] and he will continue the management of his Coumadin therapy under their direction.  Laural Benes, Constance Whittle E, PA-C   All questions were answered. The patient knows to call the clinic with any problems, questions or concerns. We can certainly see the patient much sooner if necessary.  I spent 20 minutes counseling the patient face to face. The total time spent in the appointment was 30 minutes.

## 2012-03-21 ENCOUNTER — Ambulatory Visit
Admission: RE | Admit: 2012-03-21 | Discharge: 2012-03-21 | Disposition: A | Discharge status: Home / Self Care | Payer: Medicare Other | Source: Ambulatory Visit | Attending: Radiation Oncology | Admitting: Radiation Oncology

## 2012-03-21 DIAGNOSIS — C7931 Secondary malignant neoplasm of brain: Secondary | ICD-10-CM

## 2012-03-21 MED ORDER — GADOBENATE DIMEGLUMINE 529 MG/ML IV SOLN
14.0000 mL | Freq: Once | INTRAVENOUS | Status: AC | PRN
  Administered 2012-03-21: 14 mL via INTRAVENOUS

## 2012-03-25 ENCOUNTER — Encounter (HOSPITAL_COMMUNITY): Payer: Self-pay

## 2012-03-25 ENCOUNTER — Ambulatory Visit (HOSPITAL_BASED_OUTPATIENT_CLINIC_OR_DEPARTMENT_OTHER): Payer: Medicare Other

## 2012-03-25 ENCOUNTER — Telehealth: Payer: Self-pay | Admitting: Internal Medicine

## 2012-03-25 ENCOUNTER — Ambulatory Visit (HOSPITAL_COMMUNITY): Admit: 2012-03-25 | Payer: Medicare Other | Admitting: Cardiology

## 2012-03-25 ENCOUNTER — Other Ambulatory Visit (HOSPITAL_BASED_OUTPATIENT_CLINIC_OR_DEPARTMENT_OTHER): Payer: Medicare Other | Admitting: Lab

## 2012-03-25 ENCOUNTER — Ambulatory Visit: Payer: Medicare Other | Admitting: Pharmacist

## 2012-03-25 ENCOUNTER — Other Ambulatory Visit: Payer: Self-pay | Admitting: Internal Medicine

## 2012-03-25 VITALS — BP 109/63 | HR 68 | Temp 97.1°F | Resp 20

## 2012-03-25 DIAGNOSIS — I513 Intracardiac thrombosis, not elsewhere classified: Secondary | ICD-10-CM

## 2012-03-25 DIAGNOSIS — C7951 Secondary malignant neoplasm of bone: Secondary | ICD-10-CM

## 2012-03-25 DIAGNOSIS — I219 Acute myocardial infarction, unspecified: Secondary | ICD-10-CM

## 2012-03-25 DIAGNOSIS — Z5112 Encounter for antineoplastic immunotherapy: Secondary | ICD-10-CM

## 2012-03-25 DIAGNOSIS — C180 Malignant neoplasm of cecum: Secondary | ICD-10-CM

## 2012-03-25 DIAGNOSIS — C189 Malignant neoplasm of colon, unspecified: Secondary | ICD-10-CM

## 2012-03-25 DIAGNOSIS — Z5111 Encounter for antineoplastic chemotherapy: Secondary | ICD-10-CM

## 2012-03-25 LAB — CBC WITH DIFFERENTIAL/PLATELET
Eosinophils Absolute: 0 10*3/uL (ref 0.0–0.5)
HCT: 39.1 % (ref 38.4–49.9)
LYMPH%: 26.9 % (ref 14.0–49.0)
MCV: 94.9 fL (ref 79.3–98.0)
MONO#: 1.4 10*3/uL — ABNORMAL HIGH (ref 0.1–0.9)
NEUT#: 1.1 10*3/uL — ABNORMAL LOW (ref 1.5–6.5)
NEUT%: 30.9 % — ABNORMAL LOW (ref 39.0–75.0)
Platelets: 126 10*3/uL — ABNORMAL LOW (ref 140–400)
WBC: 3.5 10*3/uL — ABNORMAL LOW (ref 4.0–10.3)

## 2012-03-25 LAB — COMPREHENSIVE METABOLIC PANEL (CC13)
ALT: 24 U/L (ref 0–55)
Albumin: 2.6 g/dL — ABNORMAL LOW (ref 3.5–5.0)
CO2: 21 mEq/L — ABNORMAL LOW (ref 22–29)
Calcium: 9.1 mg/dL (ref 8.4–10.4)
Chloride: 107 mEq/L (ref 98–107)
Potassium: 3.9 mEq/L (ref 3.5–5.1)
Sodium: 140 mEq/L (ref 136–145)
Total Protein: 5.8 g/dL — ABNORMAL LOW (ref 6.4–8.3)

## 2012-03-25 LAB — PROTIME-INR

## 2012-03-25 SURGERY — ECHOCARDIOGRAM, TRANSESOPHAGEAL
Anesthesia: Moderate Sedation

## 2012-03-25 MED ORDER — DEXTROSE 5 % IV SOLN
Freq: Once | INTRAVENOUS | Status: AC
  Administered 2012-03-25: 10:00:00 via INTRAVENOUS

## 2012-03-25 MED ORDER — SODIUM CHLORIDE 0.9 % IV SOLN
Freq: Once | INTRAVENOUS | Status: AC
  Administered 2012-03-25: 09:00:00 via INTRAVENOUS

## 2012-03-25 MED ORDER — SODIUM CHLORIDE 0.9 % IJ SOLN
10.0000 mL | INTRAMUSCULAR | Status: DC | PRN
  Filled 2012-03-25: qty 10

## 2012-03-25 MED ORDER — FLUOROURACIL CHEMO INJECTION 2.5 GM/50ML
400.0000 mg/m2 | Freq: Once | INTRAVENOUS | Status: AC
  Administered 2012-03-25: 750 mg via INTRAVENOUS
  Filled 2012-03-25: qty 15

## 2012-03-25 MED ORDER — DEXAMETHASONE SODIUM PHOSPHATE 10 MG/ML IJ SOLN
10.0000 mg | Freq: Once | INTRAMUSCULAR | Status: AC
  Administered 2012-03-25: 10 mg via INTRAVENOUS

## 2012-03-25 MED ORDER — LEUCOVORIN CALCIUM INJECTION 350 MG
400.0000 mg/m2 | Freq: Once | INTRAVENOUS | Status: AC
  Administered 2012-03-25: 760 mg via INTRAVENOUS
  Filled 2012-03-25: qty 38

## 2012-03-25 MED ORDER — SODIUM CHLORIDE 0.9 % IV SOLN
5.0000 mg/kg | Freq: Once | INTRAVENOUS | Status: AC
  Administered 2012-03-25: 350 mg via INTRAVENOUS
  Filled 2012-03-25: qty 14

## 2012-03-25 MED ORDER — DIPHENHYDRAMINE HCL 50 MG/ML IJ SOLN
25.0000 mg | Freq: Once | INTRAMUSCULAR | Status: AC
  Administered 2012-03-25: 25 mg via INTRAVENOUS

## 2012-03-25 MED ORDER — ONDANSETRON 8 MG/50ML IVPB (CHCC)
8.0000 mg | Freq: Once | INTRAVENOUS | Status: AC
  Administered 2012-03-25: 8 mg via INTRAVENOUS

## 2012-03-25 MED ORDER — SODIUM CHLORIDE 0.9 % IV SOLN
2400.0000 mg/m2 | INTRAVENOUS | Status: DC
  Administered 2012-03-25: 4550 mg via INTRAVENOUS
  Filled 2012-03-25: qty 91

## 2012-03-25 MED ORDER — OXALIPLATIN CHEMO INJECTION 100 MG/20ML
85.0000 mg/m2 | Freq: Once | INTRAVENOUS | Status: AC
  Administered 2012-03-25: 160 mg via INTRAVENOUS
  Filled 2012-03-25: qty 32

## 2012-03-25 MED ORDER — HEPARIN SOD (PORK) LOCK FLUSH 100 UNIT/ML IV SOLN
500.0000 [IU] | Freq: Once | INTRAVENOUS | Status: DC | PRN
  Filled 2012-03-25: qty 5

## 2012-03-25 MED ORDER — WARFARIN SODIUM 5 MG PO TABS: ORAL_TABLET | ORAL | Status: DC

## 2012-03-25 NOTE — Progress Notes (Signed)
1200- Pt tolerating chemo well.  No complaints.  Resting comfortably-dhp, rn 1340-Pt completed chemo without further complaints.  He is aware Dr. Arbutus Ped has added Neulasta on day of pump d/c-dhp, rn

## 2012-03-25 NOTE — Progress Notes (Signed)
INR therapeutic today (2.5) on 5mg  daily except 2.5mg  on SuTh. Has been found to have a "weak heart" by his cardiologist.  He was prescribed digoxin and lisinopril, which he hasn't started using yet.  Wife is worried that his BP will go too low, since he is already on an herbal supplement for BP.  Advised pt and wife that the purpose of using those two medications is less so his BP and more to decrease the stress on his heart, and prevent him from developing heart failure.  After discussion, they agreed to stop the herbal supplement and start digoxin and lisinopril today.   No problems with bleeding or bruising.  Pt is also scheduled for a TEE in November to reassess his cardiac function. Will have pt continue current dose, and recheck INR in 2 weeks with next scheduled lab/MD/infusion appt. A pharmacist will see him in the infusion area.

## 2012-03-25 NOTE — Progress Notes (Signed)
0855-Okay per Dr. Arbutus Ped to treat today despite treatment.  Will add Neulasta on pump d/c day per Dr. Arbutus Ped as well-dhp, rn 1040-Pt c/o face feeling flushed.  Face red.  Chemo paused.  Pt denies ShOB, itchiness, chest pain/pressure.  Pt's wife states that he will often get flushed the evening of treatment, but has never had this happen during treatment.  Pt states he feels fine-dhp, nr 1050-Dr. Mohamed aware of pt c/o.  Per Dr. Arbutus Ped, okay to restart chemo.  Will monitor, if further problems will let Dr. Arbutus Ped know-dhp, rn 1105- Pt c/o flushed face/neck again.  Pt states he face feels hot and itchy.  He also c/o itchy hands.  Chemo paused again.  Dr. Arbutus Ped aware.  Per VO Dr. Arbutus Ped, Benadryl 25mg  IV x 1 dose.  Will restart when symptoms resolve-dhp, rn 1120- Pt's face remains red/neck, but he states it is no longer itchy or hot.  He states his hands are no longer itchy.  Will continue to monitor.  D5W infusing at 522ml/hr-dhp, rn 1145-Pt resting comfortably.  Redness in face has resolved.  Will restart chemotherapy and monitor-dhp, rn

## 2012-03-25 NOTE — Patient Instructions (Signed)
[facility] Discharge Instructions for Patients Receiving Chemotherapy  Today you received the following chemotherapy agents Oxaliplatin, Leucovorin, 5FU and Avastin  To help prevent nausea and vomiting after your treatment, we encourage you to take your nausea medication as prescribed.   If you develop nausea and vomiting that is not controlled by your nausea medication, call the clinic. If it is after clinic hours your family physician or the after hours number for the clinic or go to the Emergency Department.   BELOW ARE SYMPTOMS THAT SHOULD BE REPORTED IMMEDIATELY:  *FEVER GREATER THAN 100.5 F  *CHILLS WITH OR WITHOUT FEVER  NAUSEA AND VOMITING THAT IS NOT CONTROLLED WITH YOUR NAUSEA MEDICATION  *UNUSUAL SHORTNESS OF BREATH  *UNUSUAL BRUISING OR BLEEDING  TENDERNESS IN MOUTH AND THROAT WITH OR WITHOUT PRESENCE OF ULCERS  *URINARY PROBLEMS  *BOWEL PROBLEMS  UNUSUAL RASH Items with * indicate a potential emergency and should be followed up as soon as possible.  One of the nurses will contact you 24 hours after your treatment. Please let the nurse know about any problems that you may have experienced. Feel free to call the clinic you have any questions or concerns. The clinic phone number is [phone].   I have been informed and understand all the instructions given to me. I know to contact the clinic, my physician, or go to the Emergency Department if any problems should occur. I do not have any questions at this time, but understand that I may call the clinic during office hours   should I have any questions or need assistance in obtaining follow up care.    __________________________________________  _____________  __________ Signature of Patient or Authorized Representative            Date                   Time    __________________________________________ Nurse's Signature

## 2012-03-25 NOTE — Telephone Encounter (Signed)
Gave pt appt calendar for month of October 2013 lab, chemo and MD

## 2012-03-26 ENCOUNTER — Telehealth: Payer: Self-pay | Admitting: *Deleted

## 2012-03-26 NOTE — Telephone Encounter (Signed)
Per voicemail from patient's wife, she had some questions/concerns from his appt yesterday.She was concerned that she didn't receive his AVS and wanted the name of his steriod drug.  I have forwarded the message to the desk RN.   JMW

## 2012-03-27 ENCOUNTER — Ambulatory Visit (HOSPITAL_BASED_OUTPATIENT_CLINIC_OR_DEPARTMENT_OTHER): Payer: Medicare Other

## 2012-03-27 VITALS — BP 118/85 | HR 94 | Temp 97.5°F

## 2012-03-27 DIAGNOSIS — C189 Malignant neoplasm of colon, unspecified: Secondary | ICD-10-CM

## 2012-03-27 MED ORDER — PEGFILGRASTIM INJECTION 6 MG/0.6ML
6.0000 mg | Freq: Once | SUBCUTANEOUS | Status: AC
  Administered 2012-03-27: 6 mg via SUBCUTANEOUS
  Filled 2012-03-27: qty 0.6

## 2012-03-27 MED ORDER — HEPARIN SOD (PORK) LOCK FLUSH 100 UNIT/ML IV SOLN
500.0000 [IU] | Freq: Once | INTRAVENOUS | Status: AC | PRN
  Administered 2012-03-27: 500 [IU]
  Filled 2012-03-27: qty 5

## 2012-03-27 MED ORDER — SODIUM CHLORIDE 0.9 % IJ SOLN
10.0000 mL | INTRAMUSCULAR | Status: DC | PRN
  Administered 2012-03-27: 10 mL
  Filled 2012-03-27: qty 10

## 2012-03-27 NOTE — Patient Instructions (Signed)
Call MD with any questions 

## 2012-03-31 ENCOUNTER — Encounter (HOSPITAL_COMMUNITY): Payer: Self-pay | Admitting: *Deleted

## 2012-04-01 ENCOUNTER — Inpatient Hospital Stay: Payer: Medicare Other

## 2012-04-01 ENCOUNTER — Ambulatory Visit: Payer: Medicare Other

## 2012-04-01 ENCOUNTER — Other Ambulatory Visit (HOSPITAL_BASED_OUTPATIENT_CLINIC_OR_DEPARTMENT_OTHER): Payer: Medicare Other

## 2012-04-01 ENCOUNTER — Other Ambulatory Visit: Payer: Medicare Other | Admitting: Lab

## 2012-04-01 DIAGNOSIS — C189 Malignant neoplasm of colon, unspecified: Secondary | ICD-10-CM

## 2012-04-01 DIAGNOSIS — Z5111 Encounter for antineoplastic chemotherapy: Secondary | ICD-10-CM

## 2012-04-01 LAB — COMPREHENSIVE METABOLIC PANEL (CC13)
ALT: 25 U/L (ref 0–55)
AST: 41 U/L — ABNORMAL HIGH (ref 5–34)
CO2: 23 mEq/L (ref 22–29)
Calcium: 9.2 mg/dL (ref 8.4–10.4)
Chloride: 106 mEq/L (ref 98–107)
Sodium: 139 mEq/L (ref 136–145)
Total Protein: 6.1 g/dL — ABNORMAL LOW (ref 6.4–8.3)

## 2012-04-01 LAB — CBC WITH DIFFERENTIAL/PLATELET
Basophils Absolute: 0 10*3/uL (ref 0.0–0.1)
Eosinophils Absolute: 0 10*3/uL (ref 0.0–0.5)
HGB: 13.1 g/dL (ref 13.0–17.1)
MONO#: 0.4 10*3/uL (ref 0.1–0.9)
NEUT#: 9.8 10*3/uL — ABNORMAL HIGH (ref 1.5–6.5)
RBC: 4.13 10*6/uL — ABNORMAL LOW (ref 4.20–5.82)
RDW: 26.4 % — ABNORMAL HIGH (ref 11.0–14.6)
WBC: 11 10*3/uL — ABNORMAL HIGH (ref 4.0–10.3)
lymph#: 0.8 10*3/uL — ABNORMAL LOW (ref 0.9–3.3)
nRBC: 0 % (ref 0–0)

## 2012-04-01 LAB — UA PROTEIN, DIPSTICK - CHCC: Protein, ur: <30 mg/dL

## 2012-04-02 ENCOUNTER — Ambulatory Visit: Payer: Medicare Other

## 2012-04-08 ENCOUNTER — Telehealth: Payer: Self-pay | Admitting: Internal Medicine

## 2012-04-08 ENCOUNTER — Other Ambulatory Visit (HOSPITAL_BASED_OUTPATIENT_CLINIC_OR_DEPARTMENT_OTHER): Payer: Medicare Other | Admitting: Lab

## 2012-04-08 ENCOUNTER — Ambulatory Visit (HOSPITAL_BASED_OUTPATIENT_CLINIC_OR_DEPARTMENT_OTHER): Payer: Medicare Other

## 2012-04-08 ENCOUNTER — Telehealth: Payer: Self-pay | Admitting: *Deleted

## 2012-04-08 ENCOUNTER — Other Ambulatory Visit: Payer: Self-pay | Admitting: Internal Medicine

## 2012-04-08 ENCOUNTER — Ambulatory Visit: Payer: Medicare Other | Admitting: Pharmacist

## 2012-04-08 ENCOUNTER — Ambulatory Visit (HOSPITAL_BASED_OUTPATIENT_CLINIC_OR_DEPARTMENT_OTHER): Payer: Medicare Other | Admitting: Internal Medicine

## 2012-04-08 VITALS — BP 123/55 | HR 55

## 2012-04-08 VITALS — BP 133/73 | HR 62 | Temp 97.1°F | Resp 20 | Ht 71.0 in | Wt 141.6 lb

## 2012-04-08 DIAGNOSIS — Z5112 Encounter for antineoplastic immunotherapy: Secondary | ICD-10-CM

## 2012-04-08 DIAGNOSIS — C189 Malignant neoplasm of colon, unspecified: Secondary | ICD-10-CM

## 2012-04-08 DIAGNOSIS — C787 Secondary malignant neoplasm of liver and intrahepatic bile duct: Secondary | ICD-10-CM

## 2012-04-08 DIAGNOSIS — C7931 Secondary malignant neoplasm of brain: Secondary | ICD-10-CM

## 2012-04-08 DIAGNOSIS — Z7901 Long term (current) use of anticoagulants: Secondary | ICD-10-CM

## 2012-04-08 DIAGNOSIS — C7800 Secondary malignant neoplasm of unspecified lung: Secondary | ICD-10-CM

## 2012-04-08 DIAGNOSIS — Z5111 Encounter for antineoplastic chemotherapy: Secondary | ICD-10-CM

## 2012-04-08 DIAGNOSIS — G609 Hereditary and idiopathic neuropathy, unspecified: Secondary | ICD-10-CM

## 2012-04-08 DIAGNOSIS — C180 Malignant neoplasm of cecum: Secondary | ICD-10-CM

## 2012-04-08 DIAGNOSIS — I513 Intracardiac thrombosis, not elsewhere classified: Secondary | ICD-10-CM

## 2012-04-08 LAB — COMPREHENSIVE METABOLIC PANEL (CC13)
BUN: 22 mg/dL (ref 7.0–26.0)
CO2: 24 mEq/L (ref 22–29)
Calcium: 9.7 mg/dL (ref 8.4–10.4)
Chloride: 105 mEq/L (ref 98–107)
Creatinine: 0.8 mg/dL (ref 0.7–1.3)
Total Bilirubin: 0.3 mg/dL (ref 0.20–1.20)

## 2012-04-08 LAB — CBC WITH DIFFERENTIAL/PLATELET
BASO%: 0.5 % (ref 0.0–2.0)
EOS%: 0.3 % (ref 0.0–7.0)
HCT: 40.3 % (ref 38.4–49.9)
LYMPH%: 12.7 % — ABNORMAL LOW (ref 14.0–49.0)
MCH: 32.4 pg (ref 27.2–33.4)
MCHC: 32.8 g/dL (ref 32.0–36.0)
MONO#: 1.6 10*3/uL — ABNORMAL HIGH (ref 0.1–0.9)
NEUT%: 73.5 % (ref 39.0–75.0)
Platelets: 140 10*3/uL (ref 140–400)
RBC: 4.08 10*6/uL — ABNORMAL LOW (ref 4.20–5.82)
WBC: 12.6 10*3/uL — ABNORMAL HIGH (ref 4.0–10.3)
lymph#: 1.6 10*3/uL (ref 0.9–3.3)
nRBC: 0 % (ref 0–0)

## 2012-04-08 LAB — POCT INR: INR: 1.3

## 2012-04-08 LAB — PROTIME-INR

## 2012-04-08 LAB — UA PROTEIN, DIPSTICK - CHCC: Protein, ur: NEGATIVE mg/dL

## 2012-04-08 MED ORDER — DEXTROSE 5 % IV SOLN
Freq: Once | INTRAVENOUS | Status: AC
  Administered 2012-04-08: 11:00:00 via INTRAVENOUS

## 2012-04-08 MED ORDER — LEUCOVORIN CALCIUM INJECTION 350 MG
400.0000 mg/m2 | Freq: Once | INTRAVENOUS | Status: AC
  Administered 2012-04-08: 760 mg via INTRAVENOUS
  Filled 2012-04-08: qty 38

## 2012-04-08 MED ORDER — SODIUM CHLORIDE 0.9 % IV SOLN
5.0000 mg/kg | Freq: Once | INTRAVENOUS | Status: AC
  Administered 2012-04-08: 350 mg via INTRAVENOUS
  Filled 2012-04-08: qty 14

## 2012-04-08 MED ORDER — FLUOROURACIL CHEMO INJECTION 2.5 GM/50ML
400.0000 mg/m2 | Freq: Once | INTRAVENOUS | Status: AC
  Administered 2012-04-08: 750 mg via INTRAVENOUS
  Filled 2012-04-08: qty 15

## 2012-04-08 MED ORDER — SODIUM CHLORIDE 0.9 % IV SOLN
2400.0000 mg/m2 | INTRAVENOUS | Status: DC
  Administered 2012-04-08: 4550 mg via INTRAVENOUS
  Filled 2012-04-08: qty 91

## 2012-04-08 MED ORDER — DEXAMETHASONE SODIUM PHOSPHATE 10 MG/ML IJ SOLN
10.0000 mg | Freq: Once | INTRAMUSCULAR | Status: AC
  Administered 2012-04-08: 10 mg via INTRAVENOUS

## 2012-04-08 MED ORDER — INTEGRA PLUS PO CAPS: 1.0000 | ORAL_CAPSULE | Freq: Every morning | ORAL | Status: DC

## 2012-04-08 MED ORDER — OXALIPLATIN CHEMO INJECTION 100 MG/20ML
85.0000 mg/m2 | Freq: Once | INTRAVENOUS | Status: AC
  Administered 2012-04-08: 160 mg via INTRAVENOUS
  Filled 2012-04-08: qty 32

## 2012-04-08 MED ORDER — SODIUM CHLORIDE 0.9 % IV SOLN
Freq: Once | INTRAVENOUS | Status: AC
  Administered 2012-04-08: 11:00:00 via INTRAVENOUS

## 2012-04-08 MED ORDER — ONDANSETRON 8 MG/50ML IVPB (CHCC)
8.0000 mg | Freq: Once | INTRAVENOUS | Status: AC
  Administered 2012-04-08: 8 mg via INTRAVENOUS

## 2012-04-08 MED ORDER — PROCHLORPERAZINE MALEATE 10 MG PO TABS: 10.0000 mg | ORAL_TABLET | Freq: Four times a day (QID) | ORAL | Status: DC | PRN

## 2012-04-08 NOTE — Progress Notes (Signed)
[facility] [phone]   [phone]) [phone]  OFFICE PROGRESS NOTE  [name], FNP [address] [place] [address]  DIAGNOSIS:  1) Metastatic colon adenocarcinoma (KRAS mutation) with metastatic lesion to the brain, lung, liver and bone diagnosed in May of 2013.  2) left ventricular mural thrombus incidentally diagnosed on recent CT scan of the chest on 02/28/2012.   PRIOR THERAPY: Status post stereotactic radiosurgery to 2 brain lesions   CURRENT THERAPY:  1) Systemic chemotherapy with FOLFOX/Avastin status post 7 cycles.  2) Coumadin 5 mg by mouth daily for the recently diagnosed left ventricular mural thrombus.   INTERVAL HISTORY: Joseph Rowland 78 y.o. male returns to the clinic today for followup visit accompanied by his wife. The patient is feeling fine today with no specific complaints except for mild fatigue. He denied having any significant nausea or vomiting. He continues to have mild peripheral neuropathy but not bothering him that much. The patient denied having any significant chest pain, shortness breath, cough or hemoptysis. He requested a refill of Compazine and Integra plus today.  MEDICAL HISTORY: Past Medical History  Diagnosis Date  . Lung mass   . Liver cancer   . metastatic adenocarcinoma 11/14/11    liver bx=metastatic adenocarcino33ma with tumor necrosis kras  mutation and involving brain,lung,and bones  . Brain cancer     lesions in occipital lobes,b/l suspicious mets  . Lung cancer     b/l multifocal pulmonary mets  . Hypertension   . Anemia   . History of radiation therapy 12/07/11    SRS single fraction palliative 2 intracranial lesions  . History of chemotherapy 01/07/12    FOLFAX/AVASTIN s/p 3 cycles   . Mitral regurgitation   . Dilated cardiomyopathy   . Shortness of breath     on exertion    ALLERGIES:   has no known allergies.  MEDICATIONS:  Current Outpatient Prescriptions  Medication  Sig Dispense Refill  . Alum & Mag Hydroxide-Simeth (MAGIC MOUTHWASH) SOLN Swish and spit 5 mLs 4 (four) times daily.      Marland Kitchen aspirin EC 81 MG tablet Take 81 mg by mouth daily.      . carvedilol (COREG) 3.125 MG tablet       . cholecalciferol (VITAMIN D) 1000 UNITS tablet Take 1,000 Units by mouth daily.      . digoxin (LANOXIN) 0.125 MG tablet       . ferrous sulfate 325 (65 FE) MG tablet Take 325 mg by mouth daily.       Marland Kitchen HYDROcodone-acetaminophen (LORTAB) 7.5-500 MG/15ML solution Take 7.5 mg by mouth Every 6 hours as needed.      . lidocaine-prilocaine (EMLA) cream Apply 1 application topically as needed. For port-a-cath access.      Marland Kitchen lisinopril (PRINIVIL,ZESTRIL) 5 MG tablet       . Multiple Vitamin (MULITIVITAMIN) LIQD Take 10 mLs by mouth daily. Pt uses Vitawave.      . Nutritional Supplements (NUTRITIONAL SUPPLEMENT PO) Take 2 capsules by mouth daily. Proprietary blend includes: Administrator parts, fennel seeds, feverfew aerial parts, Hops flowers, chamomile flowers extract, marshmallow root extract      . ondansetron (ZOFRAN) 4 MG tablet Take 4 mg by mouth Twice daily as needed.      . potassium chloride SA (K-DUR,KLOR-CON) 20 MEQ tablet Take 20 mEq by mouth daily.      Marland Kitchen PRESCRIPTION MEDICATION Inject into the vein every 14 (fourteen) days. Avastin, Leucovorin,  Adrucil, Eloxatin      . Probiotic Product (PROBIOTIC DAILY PO) Take 6 capsules by mouth daily.      . vitamin B-12 (CYANOCOBALAMIN) 250 MCG tablet Take 250 mcg by mouth daily.      Marland Kitchen warfarin (COUMADIN) 5 MG tablet Take 5mg  PO daily except 2.5mg  on Sunday and Thursday, or as directed.  30 tablet  3    SURGICAL HISTORY:  Past Surgical History  Procedure Date  . Hernia repair   . Right port a cath     power port right subclavian  . Colonoscopy 12/06/2011    Procedure: COLONOSCOPY;  Surgeon: Louis Meckel, MD;  Location: [facility];  Service: Endoscopy;  Laterality: N/A;  . Colonoscopy 12/06/2011    Procedure:  COLONOSCOPY;  Surgeon: Louis Meckel, MD;  Location: [facility];  Service: Endoscopy;  Laterality: N/A;    REVIEW OF SYSTEMS:  A comprehensive review of systems was negative except for: Constitutional: positive for fatigue   PHYSICAL EXAMINATION: General appearance: alert, cooperative and no distress Head: Normocephalic, without obvious abnormality, atraumatic Neck: no adenopathy Lymph nodes: Cervical, supraclavicular, and axillary nodes normal. Resp: clear to auscultation bilaterally Cardio: regular rate and rhythm, S1, S2 normal, no murmur, click, rub or gallop GI: soft, non-tender; bowel sounds normal; no masses,  no organomegaly Extremities: extremities normal, atraumatic, no cyanosis or edema  ECOG PERFORMANCE STATUS: 1 - Symptomatic but completely ambulatory  There were no vitals taken for this visit.  LABORATORY DATA: Lab Results  Component Value Date   WBC 11.0* 04/01/2012   HGB 13.1 04/01/2012   HCT 39.9 04/01/2012   MCV 96.6 04/01/2012   PLT 64* 04/01/2012      Chemistry      Component Value Date/Time   NA 139 04/01/2012 0946   NA 134* 02/28/2012 1638   K 4.1 04/01/2012 0946   K 3.9 02/28/2012 1638   CL 106 04/01/2012 0946   CL 100 02/28/2012 1638   CO2 23 04/01/2012 0946   CO2 27 02/28/2012 1638   BUN 15.0 04/01/2012 0946   BUN 11 02/28/2012 1638   CREATININE 0.8 04/01/2012 0946   CREATININE 0.90 02/28/2012 1638      Component Value Date/Time   CALCIUM 9.2 04/01/2012 0946   CALCIUM 9.2 02/28/2012 1638   ALKPHOS 222* 04/01/2012 0946   ALKPHOS 153* 02/04/2012 1034   AST 41* 04/01/2012 0946   AST 36 02/04/2012 1034   ALT 25 04/01/2012 0946   ALT 22 02/04/2012 1034   BILITOT 0.70 04/01/2012 0946   BILITOT 0.3 02/04/2012 1034       RADIOGRAPHIC STUDIES: Mr Laqueta Jean RU Contrast  01-Apr-2012  *RADIOLOGY REPORT*  Clinical Data: Metastatic cancer.  Restaging.  MRI HEAD WITHOUT AND WITH CONTRAST  Technique:  Multiplanar, multiecho pulse sequences of the brain and  surrounding structures were obtained according to standard protocol without and with intravenous contrast  Contrast: 14mL MULTIHANCE GADOBENATE DIMEGLUMINE 529 MG/ML IV SOLN  Comparison: 11/29/2011.  11/21/2011.  Findings: Previously, three punctate foci of enhancement were seen in the occipital lobes.  On today's study, there is only one punctate focus evident in the right occipital lobe.  This measured to 3 mm previously and measures 1-2 mm today.  The other two punctate occipital enhancing foci are no longer visible.  In the right parietal region adjacent to the midline, there are several small foci of restricted diffusion signal along the surface of the brain with small foci of nodular enhancement.  The  differential diagnosis for this appearance includes subacute infarction versus a cluster of small metastases to this region. Infarction is actually favored, though I cannot state that with certainty.  Elsewhere, there is a stable appearance of mild chronic small vessel disease.  No hemorrhage, hydrocephalus or extra-axial collection.  No pituitary mass.  No skull or skull base lesion.  IMPRESSION: Previously, there were three punctate foci of enhancement the occipital lobes.  Only one of these is visible today on the right, and it is a millimeter smaller, measuring 1-2 mm in size.  New small grouping of restricted diffusion and punctate contrast enhancement along the service of a right parietal gyrus.  The appearance is favored to represent subacute infarction, though a small cluster of metastases cannot be excluded at this point.   Original Report Authenticated By: Thomasenia Sales, M.D.     ASSESSMENT: This is a very pleasant 78 years old white male with metastatic colon adenocarcinoma currently on treatment with FOLFOX and Avastin status post 8 cycles. The patient is tolerating his treatment fairly well with no significant adverse effects except for mild peripheral neuropathy   PLAN: I recommended for him  to continue with his systemic chemotherapy today as scheduled. The patient receive cycle #9 today and he would come back for followup visit in 2 weeks with the start of cycle #10. For the peripheral neuropathy, it is currently very mild and I advise him to to call me immediately if it gets worse and in this case I may consider reducing the oxaliplatin dose or discontinue it completely. He was advised to call me immediately if he has any concerning symptoms in the interval.  All questions were answered. The patient knows to call the clinic with any problems, questions or concerns. We can certainly see the patient much sooner if necessary.  I spent 15 minutes counseling the patient face to face. The total time spent in the appointment was 25 minutes.

## 2012-04-08 NOTE — Progress Notes (Signed)
Unsure why INR below goal today.  INR has always been therapeutic or above goal.  No new medications have been added.  Pt changed Boost product to high calorie, but contains same amount of Vitamin K.  No other changes.  Will increase Coumadin be about 15% to 5mg  daily and check PT/INR in 1 week.

## 2012-04-08 NOTE — Patient Instructions (Signed)
Your labwork is good today to proceed with systemic chemotherapy. Followup in 2 weeks. Continue on anticoagulation with Coumadin and the dose will be adjusted by the Coumadin clinic

## 2012-04-08 NOTE — Telephone Encounter (Signed)
Per staff message and POF I have scheduled appts. JWM  

## 2012-04-08 NOTE — Progress Notes (Signed)
Patient states he had itching in his mouth and flushing on his face while receiving Oxaliplatin. Informed Dr. Arbutus Ped, ok to proceed per Dr. Arbutus Ped.

## 2012-04-08 NOTE — Patient Instructions (Signed)
[name] Discharge Instructions for Patients Receiving Chemotherapy  Today you received the following chemotherapy agents Avastin/Leucovorin/Oxaliplatin/5 FU To help prevent nausea and vomiting after your treatment, we encourage you to take your nausea medication as prescribed.  If you develop nausea and vomiting that is not controlled by your nausea medication, call the clinic. If it is after clinic hours your family physician or the after hours number for the clinic or go to the Emergency Department.   BELOW ARE SYMPTOMS THAT SHOULD BE REPORTED IMMEDIATELY:  *FEVER GREATER THAN 100.5 F  *CHILLS WITH OR WITHOUT FEVER  NAUSEA AND VOMITING THAT IS NOT CONTROLLED WITH YOUR NAUSEA MEDICATION  *UNUSUAL SHORTNESS OF BREATH  *UNUSUAL BRUISING OR BLEEDING  TENDERNESS IN MOUTH AND THROAT WITH OR WITHOUT PRESENCE OF ULCERS  *URINARY PROBLEMS  *BOWEL PROBLEMS  UNUSUAL RASH Items with * indicate a potential emergency and should be followed up as soon as possible.  One of the nurses will contact you 24 hours after your treatment. Please let the nurse know about any problems that you may have experienced. Feel free to call the clinic you have any questions or concerns. The clinic phone number is [phone].   I have been informed and understand all the instructions given to me. I know to contact the clinic, my physician, or go to the Emergency Department if any problems should occur. I do not have any questions at this time, but understand that I may call the clinic during office hours   should I have any questions or need assistance in obtaining follow up care.    __________________________________________  _____________  __________ Signature of Patient or Authorized Representative            Date                   Time    __________________________________________ Nurse's Signature

## 2012-04-08 NOTE — Telephone Encounter (Signed)
appts made and printed   aom 

## 2012-04-08 NOTE — Telephone Encounter (Signed)
appts made and mw to add tx and we will print for pt while they are here    aom

## 2012-04-10 ENCOUNTER — Telehealth: Payer: Self-pay | Admitting: Pharmacist

## 2012-04-10 ENCOUNTER — Ambulatory Visit (HOSPITAL_BASED_OUTPATIENT_CLINIC_OR_DEPARTMENT_OTHER): Payer: Medicare Other

## 2012-04-10 VITALS — BP 141/66 | HR 74 | Temp 98.3°F

## 2012-04-10 DIAGNOSIS — C180 Malignant neoplasm of cecum: Secondary | ICD-10-CM

## 2012-04-10 DIAGNOSIS — C7931 Secondary malignant neoplasm of brain: Secondary | ICD-10-CM

## 2012-04-10 DIAGNOSIS — C7800 Secondary malignant neoplasm of unspecified lung: Secondary | ICD-10-CM

## 2012-04-10 DIAGNOSIS — C189 Malignant neoplasm of colon, unspecified: Secondary | ICD-10-CM

## 2012-04-10 DIAGNOSIS — Z452 Encounter for adjustment and management of vascular access device: Secondary | ICD-10-CM

## 2012-04-10 MED ORDER — SODIUM CHLORIDE 0.9 % IJ SOLN
10.0000 mL | INTRAMUSCULAR | Status: DC | PRN
  Administered 2012-04-10: 10 mL
  Filled 2012-04-10: qty 10

## 2012-04-10 MED ORDER — HEPARIN SOD (PORK) LOCK FLUSH 100 UNIT/ML IV SOLN
500.0000 [IU] | Freq: Once | INTRAVENOUS | Status: AC | PRN
  Administered 2012-04-10: 500 [IU]
  Filled 2012-04-10: qty 5

## 2012-04-15 ENCOUNTER — Telehealth: Payer: Self-pay | Admitting: Internal Medicine

## 2012-04-15 ENCOUNTER — Inpatient Hospital Stay: Payer: Medicare Other

## 2012-04-15 ENCOUNTER — Encounter (HOSPITAL_COMMUNITY): Admission: RE | Payer: Self-pay | Source: Ambulatory Visit

## 2012-04-15 ENCOUNTER — Ambulatory Visit (HOSPITAL_COMMUNITY): Admission: RE | Admit: 2012-04-15 | Payer: Medicare Other | Source: Ambulatory Visit | Admitting: Cardiology

## 2012-04-15 ENCOUNTER — Ambulatory Visit: Payer: Medicare Other

## 2012-04-15 ENCOUNTER — Other Ambulatory Visit: Payer: Medicare Other | Admitting: Lab

## 2012-04-15 HISTORY — DX: Shortness of breath: R06.02

## 2012-04-15 HISTORY — DX: Dilated cardiomyopathy: I42.0

## 2012-04-15 HISTORY — DX: Nonrheumatic mitral (valve) insufficiency: I34.0

## 2012-04-15 SURGERY — ECHOCARDIOGRAM, TRANSESOPHAGEAL
Anesthesia: Moderate Sedation

## 2012-04-15 NOTE — Telephone Encounter (Signed)
wife called to confirm coumadin/lab appt for 10/31

## 2012-04-16 ENCOUNTER — Encounter (HOSPITAL_COMMUNITY): Payer: Self-pay

## 2012-04-16 ENCOUNTER — Telehealth: Payer: Self-pay | Admitting: Medical Oncology

## 2012-04-16 DIAGNOSIS — R630 Anorexia: Secondary | ICD-10-CM

## 2012-04-16 MED ORDER — METHYLPREDNISOLONE 4 MG PO KIT: PACK | ORAL | Status: DC

## 2012-04-16 NOTE — Telephone Encounter (Signed)
Wife reports pt has no appetite and has lsot 5 lbs in past week . She requests appetite stimulant- medrol dose pak called in per Dr Arbutus Ped,

## 2012-04-17 ENCOUNTER — Other Ambulatory Visit (HOSPITAL_BASED_OUTPATIENT_CLINIC_OR_DEPARTMENT_OTHER): Payer: Medicare Other | Admitting: Lab

## 2012-04-17 ENCOUNTER — Other Ambulatory Visit: Payer: Self-pay | Admitting: Radiation Therapy

## 2012-04-17 ENCOUNTER — Ambulatory Visit (HOSPITAL_BASED_OUTPATIENT_CLINIC_OR_DEPARTMENT_OTHER): Payer: Medicare Other | Admitting: Pharmacist

## 2012-04-17 DIAGNOSIS — I219 Acute myocardial infarction, unspecified: Secondary | ICD-10-CM

## 2012-04-17 DIAGNOSIS — I513 Intracardiac thrombosis, not elsewhere classified: Secondary | ICD-10-CM

## 2012-04-17 DIAGNOSIS — C180 Malignant neoplasm of cecum: Secondary | ICD-10-CM

## 2012-04-17 DIAGNOSIS — C189 Malignant neoplasm of colon, unspecified: Secondary | ICD-10-CM

## 2012-04-17 DIAGNOSIS — C7931 Secondary malignant neoplasm of brain: Secondary | ICD-10-CM

## 2012-04-17 LAB — CBC WITH DIFFERENTIAL/PLATELET
Basophils Absolute: 0 10*3/uL (ref 0.0–0.1)
Eosinophils Absolute: 0 10*3/uL (ref 0.0–0.5)
HGB: 12.9 g/dL — ABNORMAL LOW (ref 13.0–17.1)
MONO#: 0.2 10*3/uL (ref 0.1–0.9)
MONO%: 3 % (ref 0.0–14.0)
NEUT#: 5 10*3/uL (ref 1.5–6.5)
RBC: 3.74 10*6/uL — ABNORMAL LOW (ref 4.20–5.82)
RDW: 23.8 % — ABNORMAL HIGH (ref 11.0–14.6)
WBC: 5.8 10*3/uL (ref 4.0–10.3)
lymph#: 0.5 10*3/uL — ABNORMAL LOW (ref 0.9–3.3)

## 2012-04-17 LAB — PROTIME-INR
INR: 1.5 — ABNORMAL LOW (ref 2.00–3.50)
Protime: 18 Seconds — ABNORMAL HIGH (ref 10.6–13.4)

## 2012-04-17 LAB — COMPREHENSIVE METABOLIC PANEL (CC13)
ALT: 40 U/L (ref 0–55)
AST: 46 U/L — ABNORMAL HIGH (ref 5–34)
Albumin: 2.9 g/dL — ABNORMAL LOW (ref 3.5–5.0)
Alkaline Phosphatase: 234 U/L — ABNORMAL HIGH (ref 40–150)
BUN: 26 mg/dL (ref 7.0–26.0)
Potassium: 4.4 mEq/L (ref 3.5–5.1)

## 2012-04-17 NOTE — Progress Notes (Signed)
INR = 1.5 on 5 mg/day BP = 146/83 (sitting); HR = 58; done at pt request. Started on Medrol Dose pack yesterday for appetite stimulation.  He will be on this for ~1 week.  This may cause elevation in INR. Wife has weaned pt off his Lisinopril & Coreg.  His cardiologist is aware. Wife started pt on "Blood Pressurex", an herbal supplement that contains: l-arginine (may incr risk of bleeding), olea europaea, red grape seed (inhibits 2C9 & 3A4 metabolism; may incr INR), coleus root, hawthorn (may incr risk of bleeding) & goldenrod. INR subtherapeutic.  Taking into consideration the start of Medrol Dose pack & the grape seed potential interaction, I have slightly increased pts dose of Coumadin this week to 7.5 mg today only then 5 mg other days. Pt & his wife are aware of bleeding risk & will call if they notice any sxs. Repeat INR in 5 days, monitoring closely. Marily Lente, Pharm.D.

## 2012-04-22 ENCOUNTER — Ambulatory Visit: Payer: Medicare Other | Admitting: Nutrition

## 2012-04-22 ENCOUNTER — Ambulatory Visit (HOSPITAL_BASED_OUTPATIENT_CLINIC_OR_DEPARTMENT_OTHER): Payer: Medicare Other

## 2012-04-22 ENCOUNTER — Telehealth: Payer: Self-pay | Admitting: Internal Medicine

## 2012-04-22 ENCOUNTER — Ambulatory Visit: Payer: Medicare Other | Admitting: Pharmacist

## 2012-04-22 ENCOUNTER — Ambulatory Visit (HOSPITAL_BASED_OUTPATIENT_CLINIC_OR_DEPARTMENT_OTHER): Payer: Medicare Other | Admitting: Internal Medicine

## 2012-04-22 ENCOUNTER — Other Ambulatory Visit (HOSPITAL_BASED_OUTPATIENT_CLINIC_OR_DEPARTMENT_OTHER): Payer: Medicare Other | Admitting: Lab

## 2012-04-22 VITALS — BP 142/79 | HR 70 | Temp 96.8°F | Resp 18 | Ht 71.0 in | Wt 137.9 lb

## 2012-04-22 DIAGNOSIS — Z5111 Encounter for antineoplastic chemotherapy: Secondary | ICD-10-CM

## 2012-04-22 DIAGNOSIS — Z5112 Encounter for antineoplastic immunotherapy: Secondary | ICD-10-CM

## 2012-04-22 DIAGNOSIS — C7952 Secondary malignant neoplasm of bone marrow: Secondary | ICD-10-CM

## 2012-04-22 DIAGNOSIS — I219 Acute myocardial infarction, unspecified: Secondary | ICD-10-CM

## 2012-04-22 DIAGNOSIS — C189 Malignant neoplasm of colon, unspecified: Secondary | ICD-10-CM

## 2012-04-22 DIAGNOSIS — R630 Anorexia: Secondary | ICD-10-CM

## 2012-04-22 DIAGNOSIS — G620 Drug-induced polyneuropathy: Secondary | ICD-10-CM

## 2012-04-22 DIAGNOSIS — C180 Malignant neoplasm of cecum: Secondary | ICD-10-CM

## 2012-04-22 DIAGNOSIS — R5383 Other fatigue: Secondary | ICD-10-CM

## 2012-04-22 DIAGNOSIS — I513 Intracardiac thrombosis, not elsewhere classified: Secondary | ICD-10-CM

## 2012-04-22 DIAGNOSIS — C787 Secondary malignant neoplasm of liver and intrahepatic bile duct: Secondary | ICD-10-CM

## 2012-04-22 LAB — COMPREHENSIVE METABOLIC PANEL (CC13)
Alkaline Phosphatase: 228 U/L — ABNORMAL HIGH (ref 40–150)
CO2: 26 mEq/L (ref 22–29)
Creatinine: 0.7 mg/dL (ref 0.7–1.3)
Glucose: 125 mg/dl — ABNORMAL HIGH (ref 70–99)
Sodium: 140 mEq/L (ref 136–145)
Total Bilirubin: 0.42 mg/dL (ref 0.20–1.20)
Total Protein: 6.4 g/dL (ref 6.4–8.3)

## 2012-04-22 LAB — CBC WITH DIFFERENTIAL/PLATELET
Basophils Absolute: 0 10*3/uL (ref 0.0–0.1)
Eosinophils Absolute: 0 10*3/uL (ref 0.0–0.5)
HGB: 13 g/dL (ref 13.0–17.1)
LYMPH%: 28.6 % (ref 14.0–49.0)
MCH: 33.4 pg (ref 27.2–33.4)
MCV: 101.3 fL — ABNORMAL HIGH (ref 79.3–98.0)
MONO%: 21.7 % — ABNORMAL HIGH (ref 0.0–14.0)
NEUT#: 1.7 10*3/uL (ref 1.5–6.5)
Platelets: 178 10*3/uL (ref 140–400)
RBC: 3.89 10*6/uL — ABNORMAL LOW (ref 4.20–5.82)

## 2012-04-22 LAB — PROTIME-INR

## 2012-04-22 MED ORDER — ONDANSETRON 8 MG/50ML IVPB (CHCC)
8.0000 mg | Freq: Once | INTRAVENOUS | Status: AC
  Administered 2012-04-22: 8 mg via INTRAVENOUS

## 2012-04-22 MED ORDER — SODIUM CHLORIDE 0.9 % IV SOLN
5.0000 mg/kg | Freq: Once | INTRAVENOUS | Status: AC
  Administered 2012-04-22: 350 mg via INTRAVENOUS
  Filled 2012-04-22: qty 14

## 2012-04-22 MED ORDER — LEUCOVORIN CALCIUM INJECTION 350 MG
400.0000 mg/m2 | Freq: Once | INTRAVENOUS | Status: AC
  Administered 2012-04-22: 760 mg via INTRAVENOUS
  Filled 2012-04-22: qty 38

## 2012-04-22 MED ORDER — DEXAMETHASONE SODIUM PHOSPHATE 10 MG/ML IJ SOLN
10.0000 mg | Freq: Once | INTRAMUSCULAR | Status: AC
Start: 2012-04-22 — End: 2012-04-22
  Administered 2012-04-22: 10 mg via INTRAVENOUS

## 2012-04-22 MED ORDER — HEPARIN SOD (PORK) LOCK FLUSH 100 UNIT/ML IV SOLN
500.0000 [IU] | Freq: Once | INTRAVENOUS | Status: DC | PRN
  Filled 2012-04-22: qty 5

## 2012-04-22 MED ORDER — OXALIPLATIN CHEMO INJECTION 100 MG/20ML
85.0000 mg/m2 | Freq: Once | INTRAVENOUS | Status: AC
  Administered 2012-04-22: 160 mg via INTRAVENOUS
  Filled 2012-04-22: qty 32

## 2012-04-22 MED ORDER — SODIUM CHLORIDE 0.9 % IV SOLN
2400.0000 mg/m2 | INTRAVENOUS | Status: DC
  Administered 2012-04-22: 4550 mg via INTRAVENOUS
  Filled 2012-04-22: qty 91

## 2012-04-22 MED ORDER — FLUOROURACIL CHEMO INJECTION 2.5 GM/50ML
400.0000 mg/m2 | Freq: Once | INTRAVENOUS | Status: AC
  Administered 2012-04-22: 750 mg via INTRAVENOUS
  Filled 2012-04-22: qty 15

## 2012-04-22 MED ORDER — SODIUM CHLORIDE 0.9 % IJ SOLN
10.0000 mL | INTRAMUSCULAR | Status: DC | PRN
  Filled 2012-04-22: qty 10

## 2012-04-22 MED ORDER — DEXTROSE 5 % IV SOLN
Freq: Once | INTRAVENOUS | Status: AC
  Administered 2012-04-22: 11:00:00 via INTRAVENOUS

## 2012-04-22 NOTE — Patient Instructions (Signed)
Lab work is okay today to proceed with cycle #10 of her chemotherapy. Followup in 2 weeks

## 2012-04-22 NOTE — Progress Notes (Signed)
I spoke with patient today in the chemotherapy area. He is trying to eat more often. He appears to consume lower calorie foods such as soups. He does drink 2 boost plus daily. His weight has declined to 137 pounds November 5, down from 155.2 pounds June 19. His appetite remains poor.  Nutrition diagnosis: Unintended weight loss continues.  Intervention: I educated patient and wife on ways to increase calories and protein in the foods patient currently tolerates. I have recommended he consume boost plus twice a day to 3 times a day between meals. I provided some recipes for ways to increase calories. I've also given him a sample of a Boost nutrition bar that he might want to try. I've given additional coupons.  Monitoring, evaluation, and goals: The patient has been unable to increase calories and protein to and minimize weight loss. He will work to increase oral nutrition supplements to 3 times a day.  Next visit: Tuesday, November 19, during chemotherapy.

## 2012-04-22 NOTE — Patient Instructions (Addendum)
Continue 5mg  daily except 7.5mg  on Thursdays. Recheck INR in 1 week. 04/30/12; Lab at 10am and coumadin clinic at 10:15am.

## 2012-04-22 NOTE — Progress Notes (Signed)
Pt took the last dose of his Solu Medrol yesterday. INR may decrease since steriods are completed. Will continue 5mg  daily except 7.5mg  on Thursdays. Recheck INR in 1 week to evaluate effect on INR. 04/30/12 with lab at 10am and coumadin clinic at 10:15am.

## 2012-04-22 NOTE — Patient Instructions (Addendum)
[facility] Discharge Instructions for Patients Receiving Chemotherapy  Today you received the following chemotherapy agents Avastin, Oxaliplatin, Leucovorin and 5FU  To help prevent nausea and vomiting after your treatment, we encourage you to take your nausea medication as prescribed.   If you develop nausea and vomiting that is not controlled by your nausea medication, call the clinic. If it is after clinic hours your family physician or the after hours number for the clinic or go to the Emergency Department.   BELOW ARE SYMPTOMS THAT SHOULD BE REPORTED IMMEDIATELY:  *FEVER GREATER THAN 100.5 F  *CHILLS WITH OR WITHOUT FEVER  NAUSEA AND VOMITING THAT IS NOT CONTROLLED WITH YOUR NAUSEA MEDICATION  *UNUSUAL SHORTNESS OF BREATH  *UNUSUAL BRUISING OR BLEEDING  TENDERNESS IN MOUTH AND THROAT WITH OR WITHOUT PRESENCE OF ULCERS  *URINARY PROBLEMS  *BOWEL PROBLEMS  UNUSUAL RASH Items with * indicate a potential emergency and should be followed up as soon as possible.  One of the nurses will contact you 24 hours after your treatment. Please let the nurse know about any problems that you may have experienced. Feel free to call the clinic you have any questions or concerns. The clinic phone number is [phone].   I have been informed and understand all the instructions given to me. I know to contact the clinic, my physician, or go to the Emergency Department if any problems should occur. I do not have any questions at this time, but understand that I may call the clinic during office hours   should I have any questions or need assistance in obtaining follow up care.    __________________________________________  _____________  __________ Signature of Patient or Authorized Representative            Date                   Time    __________________________________________ Nurse's Signature

## 2012-04-22 NOTE — Progress Notes (Signed)
[facility] [phone]   Fax:(336) [phone]  OFFICE PROGRESS NOTE  [name], FNP [address] [place] [address]  DIAGNOSIS:  1) Metastatic colon adenocarcinoma (KRAS mutation) with metastatic lesion to the brain, lung, liver and bone diagnosed in May of 2013.  2) left ventricular mural thrombus incidentally diagnosed on recent CT scan of the chest on 02/28/2012.   PRIOR THERAPY: Status post stereotactic radiosurgery to 2 brain lesions   CURRENT THERAPY:  1) Systemic chemotherapy with FOLFOX/Avastin status post 9 cycles.  2) Coumadin 5 mg by mouth daily for the recently diagnosed left ventricular mural thrombus.  INTERVAL HISTORY: Joseph Rowland 78 y.o. male returns to the clinic today for followup visit accompanied by his wife. The patient is feeling fine today with no specific complaints except for lack of appetite. He lost a few pounds recently. He denied having any significant chest pain, shortness breath, cough or hemoptysis. He denied having any nausea or vomiting. He tried Medrol Dosepak for the lack of appetite but did not notice any significant improvement yet. He continues to have mild peripheral neuropathy. He is here today to start cycle #10 of his chemotherapy.  MEDICAL HISTORY: Past Medical History  Diagnosis Date  . Lung mass   . Liver cancer   . metastatic adenocarcinoma 11/14/11    liver bx=metastatic adenocarcino20ma with tumor necrosis kras  mutation and involving brain,lung,and bones  . Brain cancer     lesions in occipital lobes,b/l suspicious mets  . Lung cancer     b/l multifocal pulmonary mets  . Hypertension   . Anemia   . History of radiation therapy 12/07/11    SRS single fraction palliative 2 intracranial lesions  . History of chemotherapy 01/07/12    FOLFAX/AVASTIN s/p 3 cycles   . Mitral regurgitation   . Dilated cardiomyopathy   . Shortness of breath     on exertion    ALLERGIES:   has no  known allergies.  MEDICATIONS:  Current Outpatient Prescriptions  Medication Sig Dispense Refill  . Alum & Mag Hydroxide-Simeth (MAGIC MOUTHWASH) SOLN Swish and spit 5 mLs 4 (four) times daily.      Marland Kitchen aspirin EC 81 MG tablet Take 81 mg by mouth daily.      . cholecalciferol (VITAMIN D) 1000 UNITS tablet Take 1,000 Units by mouth daily.      . digoxin (LANOXIN) 0.125 MG tablet       . FeFum-FePoly-FA-B Cmp-C-Biot (INTEGRA PLUS) CAPS Take 1 capsule by mouth every morning.  30 capsule  2  . ferrous sulfate 325 (65 FE) MG tablet Take 325 mg by mouth daily.       Marland Kitchen HYDROcodone-acetaminophen (LORTAB) 7.5-500 MG/15ML solution Take 7.5 mg by mouth Every 6 hours as needed.      . lidocaine-prilocaine (EMLA) cream Apply 1 application topically as needed. For port-a-cath access.      . Multiple Vitamin (MULITIVITAMIN) LIQD Take 10 mLs by mouth daily. Pt uses Vitawave.      . Nutritional Supplements (NUTRITIONAL SUPPLEMENT PO) Take 2 capsules by mouth daily. Proprietary blend includes: Administrator parts, fennel seeds, feverfew aerial parts, Hops flowers, chamomile flowers extract, marshmallow root extract      . PRESCRIPTION MEDICATION Inject into the vein every 14 (fourteen) days. Avastin, Leucovorin, Adrucil, Eloxatin      . prochlorperazine (COMPAZINE) 10 MG tablet Take 10 mg by mouth every 6 (six) hours as needed.      Marland Kitchen  vitamin B-12 (CYANOCOBALAMIN) 250 MCG tablet Take 250 mcg by mouth daily.      Marland Kitchen warfarin (COUMADIN) 5 MG tablet Take 5mg  PO daily except 2.5mg  on Sunday and Thursday, or as directed.  30 tablet  3    SURGICAL HISTORY:  Past Surgical History  Procedure Date  . Hernia repair   . Right port a cath     power port right subclavian  . Colonoscopy 12/06/2011    Procedure: COLONOSCOPY;  Surgeon: Louis Meckel, MD;  Location: [facility];  Service: Endoscopy;  Laterality: N/A;  . Colonoscopy 12/06/2011    Procedure: COLONOSCOPY;  Surgeon: Louis Meckel, MD;  Location: [facility];  Service: Endoscopy;  Laterality: N/A;    REVIEW OF SYSTEMS:  A comprehensive review of systems was negative except for: Constitutional: positive for anorexia, fatigue and weight loss Gastrointestinal: positive for Lack of appetite   PHYSICAL EXAMINATION: General appearance: alert, cooperative and no distress Head: Normocephalic, without obvious abnormality, atraumatic Neck: no adenopathy Lymph nodes: Cervical, supraclavicular, and axillary nodes normal. Resp: clear to auscultation bilaterally Cardio: regular rate and rhythm, S1, S2 normal, no murmur, click, rub or gallop GI: soft, non-tender; bowel sounds normal; no masses,  no organomegaly Extremities: extremities normal, atraumatic, no cyanosis or edema Neurologic: Alert and oriented X 3, normal strength and tone. Normal symmetric reflexes. Normal coordination and gait  ECOG PERFORMANCE STATUS: 1 - Symptomatic but completely ambulatory  Blood pressure 142/79, pulse 70, temperature 96.8 F (36 C), temperature source Oral, resp. rate 18, height 5\' 11"  (1.803 m), weight 137 lb 14.4 oz (62.551 kg).  LABORATORY DATA: Lab Results  Component Value Date   WBC 3.5* 04/22/2012   HGB 13.0 04/22/2012   HCT 39.4 04/22/2012   MCV 101.3* 04/22/2012   PLT 178 04/22/2012      Chemistry      Component Value Date/Time   NA 139 04/17/2012 0925   NA 134* 02/28/2012 1638   K 4.4 04/17/2012 0925   K 3.9 02/28/2012 1638   CL 107 04/17/2012 0925   CL 100 02/28/2012 1638   CO2 24 04/17/2012 0925   CO2 27 02/28/2012 1638   BUN 26.0 04/17/2012 0925   BUN 11 02/28/2012 1638   CREATININE 0.8 04/17/2012 0925   CREATININE 0.90 02/28/2012 1638      Component Value Date/Time   CALCIUM 9.7 04/17/2012 0925   CALCIUM 9.2 02/28/2012 1638   ALKPHOS 234* 04/17/2012 0925   ALKPHOS 153* 02/04/2012 1034   AST 46* 04/17/2012 0925   AST 36 02/04/2012 1034   ALT 40 04/17/2012 0925   ALT 22 02/04/2012 1034   BILITOT 0.62 04/17/2012 0925   BILITOT 0.3  02/04/2012 1034       RADIOGRAPHIC STUDIES: No results found.  ASSESSMENT: This is a very pleasant 78 years old white male with history of metastatic colon adenocarcinoma currently undergoing systemic chemotherapy with FOLFOX/Avastin status post 9 cycles. He is tolerating his treatment fairly well with no significant adverse effects except for fatigue and lack of appetite in addition to mild peripheral neuropathy.  PLAN: His CBC parameters are good today to proceed with cycle #10 as scheduled. I gave the patient the option of treatment with Marinol for his lack of appetite but he declined it at this point. He would come back for followup visit in 2 weeks with the start of cycle #11 He was advised to call me immediately she has any concerning symptoms in the interval.  All questions were  answered. The patient knows to call the clinic with any problems, questions or concerns. We can certainly see the patient much sooner if necessary.  I spent 15 minutes counseling the patient face to face. The total time spent in the appointment was 25 minutes.

## 2012-04-22 NOTE — Telephone Encounter (Signed)
gv and printed pt appt schedule for NOV 2014 °

## 2012-04-23 ENCOUNTER — Other Ambulatory Visit: Payer: Self-pay | Admitting: Oncology

## 2012-04-24 ENCOUNTER — Ambulatory Visit (HOSPITAL_BASED_OUTPATIENT_CLINIC_OR_DEPARTMENT_OTHER): Payer: Medicare Other

## 2012-04-24 ENCOUNTER — Telehealth: Payer: Self-pay | Admitting: Internal Medicine

## 2012-04-24 VITALS — BP 167/82 | HR 77 | Temp 98.2°F

## 2012-04-24 DIAGNOSIS — Z452 Encounter for adjustment and management of vascular access device: Secondary | ICD-10-CM

## 2012-04-24 DIAGNOSIS — C180 Malignant neoplasm of cecum: Secondary | ICD-10-CM

## 2012-04-24 DIAGNOSIS — C189 Malignant neoplasm of colon, unspecified: Secondary | ICD-10-CM

## 2012-04-24 MED ORDER — SODIUM CHLORIDE 0.9 % IJ SOLN
10.0000 mL | INTRAMUSCULAR | Status: DC | PRN
  Administered 2012-04-24: 10 mL
  Filled 2012-04-24: qty 10

## 2012-04-24 MED ORDER — HEPARIN SOD (PORK) LOCK FLUSH 100 UNIT/ML IV SOLN
500.0000 [IU] | Freq: Once | INTRAVENOUS | Status: AC | PRN
  Administered 2012-04-24: 500 [IU]
  Filled 2012-04-24: qty 5

## 2012-04-24 NOTE — Telephone Encounter (Signed)
Pt needed to changed 11.13.13 appt...i called the pharmacy to make sure ok to change...per phamacist it is ok to change.

## 2012-04-30 ENCOUNTER — Ambulatory Visit: Payer: Medicare Other

## 2012-04-30 ENCOUNTER — Other Ambulatory Visit: Payer: Medicare Other | Admitting: Lab

## 2012-05-02 ENCOUNTER — Other Ambulatory Visit (HOSPITAL_BASED_OUTPATIENT_CLINIC_OR_DEPARTMENT_OTHER): Payer: Medicare Other | Admitting: Lab

## 2012-05-02 ENCOUNTER — Ambulatory Visit (HOSPITAL_BASED_OUTPATIENT_CLINIC_OR_DEPARTMENT_OTHER): Payer: Medicare Other | Admitting: Pharmacist

## 2012-05-02 DIAGNOSIS — I219 Acute myocardial infarction, unspecified: Secondary | ICD-10-CM

## 2012-05-02 DIAGNOSIS — I513 Intracardiac thrombosis, not elsewhere classified: Secondary | ICD-10-CM

## 2012-05-02 LAB — PROTIME-INR: Protime: 22.8 Seconds — ABNORMAL HIGH (ref 10.6–13.4)

## 2012-05-02 NOTE — Progress Notes (Signed)
INR = 1.9 on 5 mg/day; 7.5 mg Thurs. Pts wife stated that they saw Dr. Jacinto Halim & he told them there is no clot in pts heart.  Wife not sure why her husband needs to be on Coumadin. Pt states nose bleeding but no more than usual. INR just below goal.  Will keep dose of Coumadin the same for now & await decision after seeing Adrena on 11/18 to d/c Coumadin. Return 05/20/12 for next INR check if pt continues on anticoag.  We can see pt in tx area that day. Marily Lente, Pharm.D.

## 2012-05-05 ENCOUNTER — Telehealth: Payer: Self-pay | Admitting: Internal Medicine

## 2012-05-05 ENCOUNTER — Other Ambulatory Visit (HOSPITAL_BASED_OUTPATIENT_CLINIC_OR_DEPARTMENT_OTHER): Payer: Medicare Other | Admitting: Lab

## 2012-05-05 ENCOUNTER — Ambulatory Visit (HOSPITAL_BASED_OUTPATIENT_CLINIC_OR_DEPARTMENT_OTHER): Payer: Medicare Other | Admitting: Physician Assistant

## 2012-05-05 ENCOUNTER — Encounter: Payer: Self-pay | Admitting: Physician Assistant

## 2012-05-05 VITALS — BP 130/76 | HR 71 | Temp 97.4°F | Resp 20 | Ht 71.0 in | Wt 138.0 lb

## 2012-05-05 DIAGNOSIS — C189 Malignant neoplasm of colon, unspecified: Secondary | ICD-10-CM

## 2012-05-05 DIAGNOSIS — C180 Malignant neoplasm of cecum: Secondary | ICD-10-CM

## 2012-05-05 DIAGNOSIS — R630 Anorexia: Secondary | ICD-10-CM

## 2012-05-05 DIAGNOSIS — C7800 Secondary malignant neoplasm of unspecified lung: Secondary | ICD-10-CM

## 2012-05-05 DIAGNOSIS — C787 Secondary malignant neoplasm of liver and intrahepatic bile duct: Secondary | ICD-10-CM

## 2012-05-05 DIAGNOSIS — I513 Intracardiac thrombosis, not elsewhere classified: Secondary | ICD-10-CM

## 2012-05-05 DIAGNOSIS — C7951 Secondary malignant neoplasm of bone: Secondary | ICD-10-CM

## 2012-05-05 LAB — COMPREHENSIVE METABOLIC PANEL (CC13)
AST: 46 U/L — ABNORMAL HIGH (ref 5–34)
Alkaline Phosphatase: 194 U/L — ABNORMAL HIGH (ref 40–150)
BUN: 20 mg/dL (ref 7.0–26.0)
Creatinine: 0.8 mg/dL (ref 0.7–1.3)

## 2012-05-05 LAB — CBC WITH DIFFERENTIAL/PLATELET
Basophils Absolute: 0 10*3/uL (ref 0.0–0.1)
Eosinophils Absolute: 0 10*3/uL (ref 0.0–0.5)
HCT: 38.2 % — ABNORMAL LOW (ref 38.4–49.9)
HGB: 13 g/dL (ref 13.0–17.1)
MCH: 36.2 pg — ABNORMAL HIGH (ref 27.2–33.4)
MONO#: 0.7 10*3/uL (ref 0.1–0.9)
NEUT%: 65.3 % (ref 39.0–75.0)
lymph#: 1.1 10*3/uL (ref 0.9–3.3)

## 2012-05-05 LAB — UA PROTEIN, DIPSTICK - CHCC: Protein, ur: <30 mg/dL

## 2012-05-05 MED ORDER — DRONABINOL 2.5 MG PO CAPS: 2.5000 mg | ORAL_CAPSULE | Freq: Two times a day (BID) | ORAL | Status: DC

## 2012-05-05 NOTE — Patient Instructions (Addendum)
You have been prescribed Marinol to stimulate your appetite, take as prescribed Follow up in 2 weeks

## 2012-05-05 NOTE — Telephone Encounter (Signed)
appts made and printed for pt aom °

## 2012-05-06 ENCOUNTER — Other Ambulatory Visit: Payer: Medicare Other | Admitting: Lab

## 2012-05-06 ENCOUNTER — Encounter: Payer: Self-pay | Admitting: *Deleted

## 2012-05-06 ENCOUNTER — Ambulatory Visit (HOSPITAL_BASED_OUTPATIENT_CLINIC_OR_DEPARTMENT_OTHER): Payer: Medicare Other

## 2012-05-06 ENCOUNTER — Encounter: Payer: Self-pay | Admitting: Internal Medicine

## 2012-05-06 ENCOUNTER — Ambulatory Visit: Payer: Medicare Other | Admitting: Nutrition

## 2012-05-06 VITALS — BP 130/78 | HR 67 | Temp 97.9°F | Resp 18

## 2012-05-06 DIAGNOSIS — Z5111 Encounter for antineoplastic chemotherapy: Secondary | ICD-10-CM

## 2012-05-06 DIAGNOSIS — C189 Malignant neoplasm of colon, unspecified: Secondary | ICD-10-CM

## 2012-05-06 DIAGNOSIS — C7800 Secondary malignant neoplasm of unspecified lung: Secondary | ICD-10-CM

## 2012-05-06 DIAGNOSIS — C180 Malignant neoplasm of cecum: Secondary | ICD-10-CM

## 2012-05-06 DIAGNOSIS — Z5112 Encounter for antineoplastic immunotherapy: Secondary | ICD-10-CM

## 2012-05-06 MED ORDER — ONDANSETRON 8 MG/50ML IVPB (CHCC)
8.0000 mg | Freq: Once | INTRAVENOUS | Status: AC
  Administered 2012-05-06: 8 mg via INTRAVENOUS

## 2012-05-06 MED ORDER — DEXTROSE 5 % IV SOLN
Freq: Once | INTRAVENOUS | Status: AC
  Administered 2012-05-06: 10:00:00 via INTRAVENOUS

## 2012-05-06 MED ORDER — DEXAMETHASONE SODIUM PHOSPHATE 10 MG/ML IJ SOLN
10.0000 mg | Freq: Once | INTRAMUSCULAR | Status: AC
  Administered 2012-05-06: 10 mg via INTRAVENOUS

## 2012-05-06 MED ORDER — FLUOROURACIL CHEMO INJECTION 2.5 GM/50ML
400.0000 mg/m2 | Freq: Once | INTRAVENOUS | Status: AC
  Administered 2012-05-06: 750 mg via INTRAVENOUS
  Filled 2012-05-06: qty 15

## 2012-05-06 MED ORDER — SODIUM CHLORIDE 0.9 % IV SOLN
Freq: Once | INTRAVENOUS | Status: AC
  Administered 2012-05-06: 10:00:00 via INTRAVENOUS

## 2012-05-06 MED ORDER — SODIUM CHLORIDE 0.9 % IV SOLN
5.0000 mg/kg | Freq: Once | INTRAVENOUS | Status: AC
  Administered 2012-05-06: 350 mg via INTRAVENOUS
  Filled 2012-05-06: qty 14

## 2012-05-06 MED ORDER — FLUOROURACIL CHEMO INJECTION 5 GM/100ML
2400.0000 mg/m2 | INTRAVENOUS | Status: DC
  Administered 2012-05-06: 4550 mg via INTRAVENOUS
  Filled 2012-05-06: qty 91

## 2012-05-06 MED ORDER — LEUCOVORIN CALCIUM INJECTION 350 MG
400.0000 mg/m2 | Freq: Once | INTRAVENOUS | Status: AC
  Administered 2012-05-06: 760 mg via INTRAVENOUS
  Filled 2012-05-06: qty 38

## 2012-05-06 MED ORDER — OXALIPLATIN CHEMO INJECTION 100 MG/20ML
85.0000 mg/m2 | Freq: Once | INTRAVENOUS | Status: AC
  Administered 2012-05-06: 160 mg via INTRAVENOUS
  Filled 2012-05-06: qty 32

## 2012-05-06 MED ORDER — HEPARIN SOD (PORK) LOCK FLUSH 100 UNIT/ML IV SOLN
500.0000 [IU] | Freq: Once | INTRAVENOUS | Status: DC | PRN
  Filled 2012-05-06: qty 5

## 2012-05-06 MED ORDER — SODIUM CHLORIDE 0.9 % IJ SOLN
10.0000 mL | INTRAMUSCULAR | Status: DC | PRN
  Filled 2012-05-06: qty 10

## 2012-05-06 NOTE — Progress Notes (Signed)
[employer], [id number], approved dronabinol 2.5mg  60 tabs from 05/06/12-11/03/12 under Medicare Part B; [id number].

## 2012-05-06 NOTE — Patient Instructions (Addendum)
[facility] Discharge Instructions for Patients Receiving Chemotherapy  Today you received the following chemotherapy agents avastin/folfox  To help prevent nausea and vomiting after your treatment, we encourage you to take your nausea medication  and take it as often as prescribed If you develop nausea and vomiting that is not controlled by your nausea medication, call the clinic. If it is after clinic hours your family physician or the after hours number for the clinic or go to the Emergency Department.   BELOW ARE SYMPTOMS THAT SHOULD BE REPORTED IMMEDIATELY:  *FEVER GREATER THAN 100.5 F  *CHILLS WITH OR WITHOUT FEVER  NAUSEA AND VOMITING THAT IS NOT CONTROLLED WITH YOUR NAUSEA MEDICATION  *UNUSUAL SHORTNESS OF BREATH  *UNUSUAL BRUISING OR BLEEDING  TENDERNESS IN MOUTH AND THROAT WITH OR WITHOUT PRESENCE OF ULCERS  *URINARY PROBLEMS  *BOWEL PROBLEMS  UNUSUAL RASH Items with * indicate a potential emergency and should be followed up as soon as possible.  One of the nurses will contact you 24 hours after your treatment. Please let the nurse know about any problems that you may have experienced. Feel free to call the clinic you have any questions or concerns. The clinic phone number is [phone].   I have been informed and understand all the instructions given to me. I know to contact the clinic, my physician, or go to the Emergency Department if any problems should occur. I do not have any questions at this time, but understand that I may call the clinic during office hours   should I have any questions or need assistance in obtaining follow up care.    __________________________________________  _____________  __________ Signature of Patient or Authorized Representative            Date                   Time    __________________________________________ Nurse's Signature

## 2012-05-06 NOTE — Progress Notes (Signed)
RECEIVED A FAX FROM [facility] CONCERNING A PRIOR AUTHORIZATION FOR DRONABINOL. THIS REQUEST WAS PLACED IN THE MANAGED CARE BIN.

## 2012-05-06 NOTE — Progress Notes (Signed)
Patient continues to have poor appetite. He has a new prescription for Marinol. He states he drinks 2 boost plus daily. His weight was documented as 138 pounds on November 18. He complains of sore mouth and thick phlegm.  Nutrition diagnosis: Unintended weight loss has improved.  Intervention: I provided suggestions for patient to increase calories throughout the day. Patient has agreed to increase boost plus to 3 times daily. In addition he is going to add one glass of eggnog a day. I provided additional coupons at his request. I also educated patient on strategies for eating with sore mouth. I've encouraged patient to rinse mouth frequently and increase fluids throughout the day. I've also recommended he try a coolmist humidifier in his bedroom overnight.  Monitoring, evaluation, and goals: The patient continues to have poor appetite but is consuming enough calories and protein to maintain current weight. He will work to increase oral nutrition supplements to 3 times daily.  Next visit: Tuesday, December 3 during chemotherapy.

## 2012-05-07 ENCOUNTER — Telehealth: Payer: Self-pay | Admitting: *Deleted

## 2012-05-07 NOTE — Telephone Encounter (Signed)
Pt's wife called stating that after pt had his tx yesterday he c/o his face turning bright red.  She stated they called the MD on call and they recommended taking a benadryl tablet.  Pt woke up this morning and his cheeks were flushed.  He is also c/o of a sore mouth and throat.  This typically happens after he has chemo but it is usually not within the 1st 24 hours.  Per Dimas Alexandria, ask the pharmacy regarding what intervention is needed.  Per Ebony Hail, PharmD, no intervention is needed at this time.  Pt is already using biotene and magic mouthwash prn, and it appears that the flushed cheeks could be r/t the steroids given IV.  However, it was advised that Dr Donnald Garre become aware about whether to run the oxaliplatin slower the next time since he has a hx of face flushing during his infusions in the past and there might be concern of a potential reaction during furtue infusions.   Will send msg to Dr Donnald Garre.  Lenon Curt will make a note in pt's chart.  Pt's wife verbalized understanding.  SLJ

## 2012-05-08 ENCOUNTER — Ambulatory Visit (HOSPITAL_BASED_OUTPATIENT_CLINIC_OR_DEPARTMENT_OTHER): Payer: Medicare Other

## 2012-05-08 VITALS — BP 127/80 | HR 68 | Temp 97.5°F

## 2012-05-08 DIAGNOSIS — C180 Malignant neoplasm of cecum: Secondary | ICD-10-CM

## 2012-05-08 DIAGNOSIS — C189 Malignant neoplasm of colon, unspecified: Secondary | ICD-10-CM

## 2012-05-08 DIAGNOSIS — Z452 Encounter for adjustment and management of vascular access device: Secondary | ICD-10-CM

## 2012-05-08 DIAGNOSIS — C7800 Secondary malignant neoplasm of unspecified lung: Secondary | ICD-10-CM

## 2012-05-08 MED ORDER — SODIUM CHLORIDE 0.9 % IJ SOLN
10.0000 mL | INTRAMUSCULAR | Status: DC | PRN
  Administered 2012-05-08: 10 mL
  Filled 2012-05-08: qty 10

## 2012-05-08 MED ORDER — HEPARIN SOD (PORK) LOCK FLUSH 100 UNIT/ML IV SOLN
500.0000 [IU] | Freq: Once | INTRAVENOUS | Status: AC | PRN
  Administered 2012-05-08: 500 [IU]
  Filled 2012-05-08: qty 5

## 2012-05-12 NOTE — Progress Notes (Signed)
[facility] [phone]   Fax:([phone]  OFFICE PROGRESS NOTE  [name], FNP [address] [place] [address]  DIAGNOSIS:  1) Metastatic colon adenocarcinoma (KRAS mutation) with metastatic lesion to the brain, lung, liver and bone diagnosed in May of 2013.  2) left ventricular mural thrombus incidentally diagnosed on recent CT scan of the chest on 02/28/2012.   PRIOR THERAPY: Status post stereotactic radiosurgery to 2 brain lesions   CURRENT THERAPY:  1) Systemic chemotherapy with FOLFOX/Avastin status post 10 cycles.  2) Coumadin 5 mg by mouth daily for the recently diagnosed left ventricular mural thrombus.  INTERVAL HISTORY: Joseph Rowland 78 y.o. male returns to the clinic today for followup visit accompanied by his wife. He reports he saw his cardiologist last week and was told that he is on his current medications to strengthen his heart not specifically for his blood pressure. He reports that he is still not eating well and at the Medrol Dosepak did not help his appetite and he. In September his weight 150 pounds and today his weight is 138 pounds. Other than the lack of appetite she voiced no other specific complaints. He denied having any significant chest pain, shortness breath, cough or hemoptysis. He denied having any nausea or vomiting. He tried Medrol Dosepak for the lack of appetite but did not notice any significant improvement yet. He continues to have mild peripheral neuropathy. He is here today to start cycle #11 of his chemotherapy.  MEDICAL HISTORY: Past Medical History  Diagnosis Date  . Lung mass   . Liver cancer   . metastatic adenocarcinoma 11/14/11    liver bx=metastatic adenocarcino85ma with tumor necrosis kras  mutation and involving brain,lung,and bones  . Brain cancer     lesions in occipital lobes,b/l suspicious mets  . Lung cancer     b/l multifocal pulmonary mets  . Hypertension   . Anemia     . History of radiation therapy 12/07/11    SRS single fraction palliative 2 intracranial lesions  . History of chemotherapy 01/07/12    FOLFAX/AVASTIN s/p 3 cycles   . Mitral regurgitation   . Dilated cardiomyopathy   . Shortness of breath     on exertion    ALLERGIES:   has no known allergies.  MEDICATIONS:  Current Outpatient Prescriptions  Medication Sig Dispense Refill  . Alum & Mag Hydroxide-Simeth (MAGIC MOUTHWASH) SOLN Swish and spit 5 mLs 4 (four) times daily.      Marland Kitchen aspirin EC 81 MG tablet Take 81 mg by mouth daily.      . carvedilol (COREG) 3.125 MG tablet       . cholecalciferol (VITAMIN D) 1000 UNITS tablet Take 1,000 Units by mouth daily.      . digoxin (LANOXIN) 0.125 MG tablet       . dronabinol (MARINOL) 2.5 MG capsule Take 1 capsule (2.5 mg total) by mouth 2 (two) times daily before a meal.  60 capsule  0  . FeFum-FePoly-FA-B Cmp-C-Biot (INTEGRA PLUS) CAPS Take 1 capsule by mouth every morning.  30 capsule  2  . ferrous sulfate 325 (65 FE) MG tablet Take 325 mg by mouth daily.       Marland Kitchen HYDROcodone-acetaminophen (LORTAB) 7.5-500 MG/15ML solution Take 7.5 mg by mouth Every 6 hours as needed.      . lidocaine-prilocaine (EMLA) cream Apply 1 application topically as needed. For port-a-cath access.      Marland Kitchen lisinopril (PRINIVIL,ZESTRIL) 5  MG tablet       . Multiple Vitamin (MULITIVITAMIN) LIQD Take 10 mLs by mouth daily. Pt uses Vitawave.      . Nutritional Supplements (NUTRITIONAL SUPPLEMENT PO) Take 2 capsules by mouth daily. Proprietary blend includes: Administrator parts, fennel seeds, feverfew aerial parts, Hops flowers, chamomile flowers extract, marshmallow root extract      . ondansetron (ZOFRAN) 4 MG tablet       . potassium chloride (K-DUR,KLOR-CON) 10 MEQ tablet       . PRESCRIPTION MEDICATION Inject into the vein every 14 (fourteen) days. Avastin, Leucovorin, Adrucil, Eloxatin      . prochlorperazine (COMPAZINE) 10 MG tablet Take 10 mg by mouth every 6  (six) hours as needed.      . vitamin B-12 (CYANOCOBALAMIN) 250 MCG tablet Take 250 mcg by mouth daily.      Marland Kitchen warfarin (COUMADIN) 5 MG tablet Take 5mg  PO daily except 2.5mg  on Sunday and Thursday, or as directed.  30 tablet  3    SURGICAL HISTORY:  Past Surgical History  Procedure Date  . Hernia repair   . Right port a cath     power port right subclavian  . Colonoscopy 12/06/2011    Procedure: COLONOSCOPY;  Surgeon: Louis Meckel, MD;  Location: [facility];  Service: Endoscopy;  Laterality: N/A;  . Colonoscopy 12/06/2011    Procedure: COLONOSCOPY;  Surgeon: Louis Meckel, MD;  Location: [facility];  Service: Endoscopy;  Laterality: N/A;    REVIEW OF SYSTEMS:  A comprehensive review of systems was negative except for: Constitutional: positive for anorexia, fatigue and weight loss Gastrointestinal: positive for Lack of appetite   PHYSICAL EXAMINATION: General appearance: alert, cooperative and no distress Head: Normocephalic, without obvious abnormality, atraumatic Neck: no adenopathy Lymph nodes: Cervical, supraclavicular, and axillary nodes normal. Resp: clear to auscultation bilaterally Cardio: regular rate and rhythm, S1, S2 normal, no murmur, click, rub or gallop GI: soft, non-tender; bowel sounds normal; no masses,  no organomegaly Extremities: extremities normal, atraumatic, no cyanosis or edema Neurologic: Alert and oriented X 3, normal strength and tone. Normal symmetric reflexes. Normal coordination and gait  ECOG PERFORMANCE STATUS: 1 - Symptomatic but completely ambulatory  Blood pressure 130/76, pulse 71, temperature 97.4 F (36.3 C), temperature source Oral, resp. rate 20, height 5\' 11"  (1.803 m), weight 138 lb (62.596 kg).  LABORATORY DATA: Lab Results  Component Value Date   WBC 5.3 05/05/2012   HGB 13.0 05/05/2012   HCT 38.2* 05/05/2012   MCV 106.4* 05/05/2012   PLT 129* 05/05/2012      Chemistry      Component Value Date/Time   NA 137 05/05/2012  1434   NA 134* 02/28/2012 1638   K 4.4 05/05/2012 1434   K 3.9 02/28/2012 1638   CL 106 05/05/2012 1434   CL 100 02/28/2012 1638   CO2 26 05/05/2012 1434   CO2 27 02/28/2012 1638   BUN 20.0 05/05/2012 1434   BUN 11 02/28/2012 1638   CREATININE 0.8 05/05/2012 1434   CREATININE 0.90 02/28/2012 1638      Component Value Date/Time   CALCIUM 9.2 05/05/2012 1434   CALCIUM 9.2 02/28/2012 1638   ALKPHOS 194* 05/05/2012 1434   ALKPHOS 153* 02/04/2012 1034   AST 46* 05/05/2012 1434   AST 36 02/04/2012 1034   ALT 40 05/05/2012 1434   ALT 22 02/04/2012 1034   BILITOT 0.64 05/05/2012 1434   BILITOT 0.3 02/04/2012 1034  RADIOGRAPHIC STUDIES: No results found.  ASSESSMENT/PLAN: This is a very pleasant 78 years old white male with history of metastatic colon adenocarcinoma currently undergoing systemic chemotherapy with FOLFOX/Avastin status post 10 cycles. He is tolerating his treatment fairly well with no significant adverse effects except for fatigue and lack of appetite in addition to mild peripheral neuropathy. Patient was discussed with Dr. Arbutus Ped. We'll place him on Marinol 2.5 mg by mouth twice daily for appetite stimulation. He was given a prescription for 60 tablets with no refill. He'll proceed with cycle #11 of his systemic chemotherapy with FOLFOX and Avastin. He will followup in 2 weeks prior to his next cycle of chemotherapy  Laural Benes, Edgerrin Correia E, PA-C   All questions were answered. The patient knows to call the clinic with any problems, questions or concerns. We can certainly see the patient much sooner if necessary.  I spent 20 minutes counseling the patient face to face. The total time spent in the appointment was 30 minutes.

## 2012-05-14 ENCOUNTER — Telehealth: Payer: Self-pay | Admitting: Medical Oncology

## 2012-05-14 NOTE — Telephone Encounter (Signed)
Joseph Rowland called to report that " the last treatment devastated him".He is not eating and just lying on sofa.   She also reported pt has an intolerance to Dronabinol -it caused pt to hiccough for 3 days . When he stopped taking the med the hiccoughs stopped. The skin on his fingertips is peeling. She is putting lotion on his fingers. I told her to continue the lotion and to call for any problems. He has follow up on 05/20/12.

## 2012-05-16 ENCOUNTER — Telehealth: Payer: Self-pay | Admitting: *Deleted

## 2012-05-16 ENCOUNTER — Telehealth: Payer: Self-pay | Admitting: Pharmacist

## 2012-05-16 NOTE — Telephone Encounter (Signed)
Pt's wife called stating that he has red splotches on his left arm.  They started this past Monday 11/25 and have gradually started to increase in number.  No pain, swelling associated with splotches.  Spoke to [employer] in pharmacy with the coumadin clinic.  She states that it does not sound like he is having bruising, it could be skin discoloration r/t the chemo.  Pt is to return on 12/3 for f/u with [facility].  It can be assessed at that time.  Tiana Loft aware of pt status.  Pt's wife verbalized understanding.  Pt's wife also advised if splotches become worse or turn a purple color then he may need to go to the ED for evaluation.  SLJ

## 2012-05-16 NOTE — Telephone Encounter (Signed)
Emalee called to discuss concern over development of splotches/discoloration of skin on hands, arms, and neck and whether this could be an adverse effect of Coumadin.  Discussed this concern with Judeth Cornfield, Dr. Asa Lente RN, as well.  Does not seem that these spots are actual bruises.  Advised her to watch these patches, and if they darken or become purple - pt should go to the ER due to their size and number.  Otherwise, will evaluate this skin manifestation when pt returns for PT/INR on Tuesday, 05/20/12.

## 2012-05-19 ENCOUNTER — Other Ambulatory Visit: Payer: Medicare Other | Admitting: Lab

## 2012-05-19 ENCOUNTER — Inpatient Hospital Stay: Payer: Medicare Other

## 2012-05-19 ENCOUNTER — Ambulatory Visit: Payer: Medicare Other

## 2012-05-19 ENCOUNTER — Ambulatory Visit: Payer: Medicare Other | Admitting: Internal Medicine

## 2012-05-20 ENCOUNTER — Other Ambulatory Visit (HOSPITAL_BASED_OUTPATIENT_CLINIC_OR_DEPARTMENT_OTHER): Payer: Medicare Other | Admitting: Lab

## 2012-05-20 ENCOUNTER — Ambulatory Visit (HOSPITAL_BASED_OUTPATIENT_CLINIC_OR_DEPARTMENT_OTHER): Payer: Medicare Other | Admitting: Pharmacist

## 2012-05-20 ENCOUNTER — Telehealth: Payer: Self-pay | Admitting: Internal Medicine

## 2012-05-20 ENCOUNTER — Ambulatory Visit (HOSPITAL_BASED_OUTPATIENT_CLINIC_OR_DEPARTMENT_OTHER): Payer: Medicare Other | Admitting: Internal Medicine

## 2012-05-20 ENCOUNTER — Encounter: Payer: Medicare Other | Admitting: Nutrition

## 2012-05-20 ENCOUNTER — Encounter: Payer: Self-pay | Admitting: Internal Medicine

## 2012-05-20 ENCOUNTER — Inpatient Hospital Stay: Payer: Medicare Other

## 2012-05-20 VITALS — BP 136/91 | HR 76 | Temp 96.8°F | Resp 16 | Ht 71.0 in | Wt 131.4 lb

## 2012-05-20 DIAGNOSIS — C787 Secondary malignant neoplasm of liver and intrahepatic bile duct: Secondary | ICD-10-CM

## 2012-05-20 DIAGNOSIS — C7951 Secondary malignant neoplasm of bone: Secondary | ICD-10-CM

## 2012-05-20 DIAGNOSIS — C180 Malignant neoplasm of cecum: Secondary | ICD-10-CM

## 2012-05-20 DIAGNOSIS — D701 Agranulocytosis secondary to cancer chemotherapy: Secondary | ICD-10-CM

## 2012-05-20 DIAGNOSIS — T451X5A Adverse effect of antineoplastic and immunosuppressive drugs, initial encounter: Secondary | ICD-10-CM

## 2012-05-20 DIAGNOSIS — I513 Intracardiac thrombosis, not elsewhere classified: Secondary | ICD-10-CM

## 2012-05-20 DIAGNOSIS — C7800 Secondary malignant neoplasm of unspecified lung: Secondary | ICD-10-CM

## 2012-05-20 DIAGNOSIS — C189 Malignant neoplasm of colon, unspecified: Secondary | ICD-10-CM

## 2012-05-20 DIAGNOSIS — D709 Neutropenia, unspecified: Secondary | ICD-10-CM

## 2012-05-20 DIAGNOSIS — I219 Acute myocardial infarction, unspecified: Secondary | ICD-10-CM

## 2012-05-20 HISTORY — DX: Agranulocytosis secondary to cancer chemotherapy: D70.1

## 2012-05-20 HISTORY — DX: Adverse effect of antineoplastic and immunosuppressive drugs, initial encounter: T45.1X5A

## 2012-05-20 LAB — COMPREHENSIVE METABOLIC PANEL (CC13)
ALT: 30 U/L (ref 0–55)
AST: 37 U/L — ABNORMAL HIGH (ref 5–34)
Albumin: 2.5 g/dL — ABNORMAL LOW (ref 3.5–5.0)
Alkaline Phosphatase: 190 U/L — ABNORMAL HIGH (ref 40–150)
BUN: 20 mg/dL (ref 7.0–26.0)
CO2: 26 meq/L (ref 22–29)
Calcium: 8.8 mg/dL (ref 8.4–10.4)
Chloride: 104 meq/L (ref 98–107)
Creatinine: 0.8 mg/dL (ref 0.7–1.3)
Glucose: 125 mg/dL — ABNORMAL HIGH (ref 70–99)
Potassium: 3.7 meq/L (ref 3.5–5.1)
Sodium: 140 meq/L (ref 136–145)
Total Bilirubin: 0.61 mg/dL (ref 0.20–1.20)
Total Protein: 5.9 g/dL — ABNORMAL LOW (ref 6.4–8.3)

## 2012-05-20 LAB — PROTIME-INR

## 2012-05-20 LAB — PROTHROMBIN TIME
INR: 9.29
Prothrombin Time: 68.3 s — ABNORMAL HIGH (ref 11.6–15.2)

## 2012-05-20 LAB — CBC WITH DIFFERENTIAL/PLATELET
Basophils Absolute: 0 10*3/uL (ref 0.0–0.1)
Eosinophils Absolute: 0 10*3/uL (ref 0.0–0.5)
HCT: 40.3 % (ref 38.4–49.9)
HGB: 13.7 g/dL (ref 13.0–17.1)
NEUT#: 0.3 10*3/uL — CL (ref 1.5–6.5)
NEUT%: 15.2 % — ABNORMAL LOW (ref 39.0–75.0)
RDW: 19.9 % — ABNORMAL HIGH (ref 11.0–14.6)
lymph#: 0.9 10*3/uL (ref 0.9–3.3)

## 2012-05-20 MED ORDER — PEGFILGRASTIM INJECTION 6 MG/0.6ML
6.0000 mg | Freq: Once | SUBCUTANEOUS | Status: AC
  Administered 2012-05-20: 6 mg via SUBCUTANEOUS
  Filled 2012-05-20: qty 0.6

## 2012-05-20 NOTE — Telephone Encounter (Signed)
appts made and printed for pt aom °Pt aware that sch will call with scan appt  °

## 2012-05-20 NOTE — Patient Instructions (Signed)
You have persistent neutropenia after the last cycle of chemotherapy. We will delay the next cycle of chemotherapy for 2 weeks to give you more time to recover from the significant weakness, fatigue and poor appetite. I would repeat CT scan of the chest, abdomen and pelvis before her upcoming visit in 2 weeks.

## 2012-05-20 NOTE — Patient Instructions (Addendum)
Take Vitamin K 2.5mg  orally today. Hold Coumadin until further notice. Recheck INR tomorrow (12/4); lab at 2:45pm and Coumadin clinic at 3pm.

## 2012-05-20 NOTE — Progress Notes (Signed)
[facility] [phone]   Fax:([phone]) [phone]  OFFICE PROGRESS NOTE  [name], FNP [address] [place] [address]  DIAGNOSIS:  1) Metastatic colon adenocarcinoma (KRAS mutation) with metastatic lesion to the brain, lung, liver and bone diagnosed in May of 2013.  2) left ventricular mural thrombus incidentally diagnosed on recent CT scan of the chest on 02/28/2012.   PRIOR THERAPY: Status post stereotactic radiosurgery to 2 brain lesions   CURRENT THERAPY:  1) Systemic chemotherapy with FOLFOX/Avastin status post 11 cycles.  2) Coumadin 5 mg by mouth daily for the recently diagnosed left ventricular mural thrombus.  INTERVAL HISTORY: Joseph Rowland 78 y.o. male returns to the clinic today for followup visit accompanied his wife. The patient has been complaining of increasing fatigue and weakness as well as lack of appetite and weight loss over the last 2 weeks. He has rough time after the last cycle of his systemic chemotherapy. He also continues to have some bruises on the upper extremities secondary to his Coumadin treatment. The patient denied having any significant chest pain, shortness breath, cough or hemoptysis. He could not tolerate treatment with Marinol in the past for his poor appetite but he did a little bit better with Medrol Dosepak. He was supposed to start cycle #12 of his chemotherapy today but his CBC showed severe neutropenia.  MEDICAL HISTORY: Past Medical History  Diagnosis Date  . Lung mass   . Liver cancer   . metastatic adenocarcinoma 11/14/11    liver bx=metastatic adenocarcino62ma with tumor necrosis kras  mutation and involving brain,lung,and bones  . Brain cancer     lesions in occipital lobes,b/l suspicious mets  . Lung cancer     b/l multifocal pulmonary mets  . Hypertension   . Anemia   . History of radiation therapy 12/07/11    SRS single fraction palliative 2 intracranial lesions  . History of  chemotherapy 01/07/12    FOLFAX/AVASTIN s/p 3 cycles   . Mitral regurgitation   . Dilated cardiomyopathy   . Shortness of breath     on exertion    ALLERGIES:  is allergic to dronabinol.  MEDICATIONS:  Current Outpatient Prescriptions  Medication Sig Dispense Refill  . Alum & Mag Hydroxide-Simeth (MAGIC MOUTHWASH) SOLN Swish and spit 5 mLs 4 (four) times daily.      Marland Kitchen aspirin EC 81 MG tablet Take 81 mg by mouth daily.      . carvedilol (COREG) 3.125 MG tablet       . cholecalciferol (VITAMIN D) 1000 UNITS tablet Take 1,000 Units by mouth daily.      . digoxin (LANOXIN) 0.125 MG tablet       . FeFum-FePoly-FA-B Cmp-C-Biot (INTEGRA PLUS) CAPS Take 1 capsule by mouth every morning.  30 capsule  2  . ferrous sulfate 325 (65 FE) MG tablet Take 325 mg by mouth daily.       Marland Kitchen HYDROcodone-acetaminophen (LORTAB) 7.5-500 MG/15ML solution Take 7.5 mg by mouth Every 6 hours as needed.      . lidocaine-prilocaine (EMLA) cream Apply 1 application topically as needed. For port-a-cath access.      Marland Kitchen lisinopril (PRINIVIL,ZESTRIL) 5 MG tablet       . Multiple Vitamin (MULITIVITAMIN) LIQD Take 10 mLs by mouth daily. Pt uses Vitawave.      . Nutritional Supplements (NUTRITIONAL SUPPLEMENT PO) Take 2 capsules by mouth daily. Proprietary blend includes: Administrator parts, fennel seeds, feverfew aerial parts, Hops  flowers, chamomile flowers extract, marshmallow root extract      . ondansetron (ZOFRAN) 4 MG tablet       . potassium chloride (K-DUR,KLOR-CON) 10 MEQ tablet       . PRESCRIPTION MEDICATION Inject into the vein every 14 (fourteen) days. Avastin, Leucovorin, Adrucil, Eloxatin      . prochlorperazine (COMPAZINE) 10 MG tablet Take 10 mg by mouth every 6 (six) hours as needed.      . vitamin B-12 (CYANOCOBALAMIN) 250 MCG tablet Take 250 mcg by mouth daily.      Marland Kitchen warfarin (COUMADIN) 5 MG tablet Take 5mg  PO daily except 2.5mg  on Sunday and Thursday, or as directed.  30 tablet  3    SURGICAL  HISTORY:  Past Surgical History  Procedure Date  . Hernia repair   . Right port a cath     power port right subclavian  . Colonoscopy 12/06/2011    Procedure: COLONOSCOPY;  Surgeon: Louis Meckel, MD;  Location: [facility];  Service: Endoscopy;  Laterality: N/A;  . Colonoscopy 12/06/2011    Procedure: COLONOSCOPY;  Surgeon: Louis Meckel, MD;  Location: [facility];  Service: Endoscopy;  Laterality: N/A;    REVIEW OF SYSTEMS:  A comprehensive review of systems was negative except for: Constitutional: positive for anorexia, fatigue and weight loss   PHYSICAL EXAMINATION: General appearance: alert, cooperative, fatigued, no distress and moderate distress Head: Normocephalic, without obvious abnormality, atraumatic Neck: no adenopathy Lymph nodes: Cervical, supraclavicular, and axillary nodes normal. Resp: clear to auscultation bilaterally Cardio: regular rate and rhythm, S1, S2 normal, no murmur, click, rub or gallop GI: soft, non-tender; bowel sounds normal; no masses,  no organomegaly Extremities: extremities normal, atraumatic, no cyanosis or edema  ECOG PERFORMANCE STATUS: 2 - Symptomatic, <50% confined to bed  There were no vitals taken for this visit.  LABORATORY DATA: Lab Results  Component Value Date   WBC 1.7* 05/20/2012   HGB 13.7 05/20/2012   HCT 40.3 05/20/2012   MCV 107.9* 05/20/2012   PLT 140 05/20/2012      Chemistry      Component Value Date/Time   NA 137 05/05/2012 1434   NA 134* 02/28/2012 1638   K 4.4 05/05/2012 1434   K 3.9 02/28/2012 1638   CL 106 05/05/2012 1434   CL 100 02/28/2012 1638   CO2 26 05/05/2012 1434   CO2 27 02/28/2012 1638   BUN 20.0 05/05/2012 1434   BUN 11 02/28/2012 1638   CREATININE 0.8 05/05/2012 1434   CREATININE 0.90 02/28/2012 1638      Component Value Date/Time   CALCIUM 9.2 05/05/2012 1434   CALCIUM 9.2 02/28/2012 1638   ALKPHOS 194* 05/05/2012 1434   ALKPHOS 153* 02/04/2012 1034   AST 46* 05/05/2012 1434   AST 36 02/04/2012  1034   ALT 40 05/05/2012 1434   ALT 22 02/04/2012 1034   BILITOT 0.64 05/05/2012 1434   BILITOT 0.3 02/04/2012 1034       RADIOGRAPHIC STUDIES: No results found.  ASSESSMENT: This is a very pleasant 78 years old white male with metastatic colon adenocarcinoma currently on systemic chemotherapy with FOLFOX/Avastin status post 11 cycles. The patient has significant fatigue and weakness as well as lack of appetite and weight loss as well as persistent neutropenia after the last cycle of his systemic chemotherapy.   PLAN: I discussed the lab result with the patient and his wife. I recommended for him to take a break from chemotherapy for the next 2 weeks.  We'll repeat CT scan of the chest, abdomen and pelvis for restaging of his disease. For the persistent neutropenia after systemic chemotherapy I will start the patient today on Neulasta 6 mg subcutaneously x1. He was advised to continue with the nutritional supplements and encouraged fluid intake. He would come back for followup visit in 2 weeks for reevaluation and discussion of his scan results. He was advised to call me immediately if he has any concerning symptoms in the interval.   All questions were answered. The patient knows to call the clinic with any problems, questions or concerns. We can certainly see the patient much sooner if necessary.  I spent 15 minutes counseling the patient face to face. The total time spent in the appointment was 25 minutes.

## 2012-05-20 NOTE — Progress Notes (Signed)
INR likely above goal secondary to no appetite and not eating much over the last 2 weeks. Pt has lost 7 lbs in the last 2 weeks. Pt has multiple petechiae and bruises (some large) over his arms. A few bruises on his legs. No headaches or abdominal pain. No blood in urine or stool. Take Vitamin K 2.5mg  orally today. (Pt given sample tablet). Hold Coumadin until further notice. Recheck INR tomorrow (12/4); lab at 2:45pm and Coumadin clinic at 3pm. Pt instructed use electric razor if needed for shaving and to avoid knives and sharp objects. Pt and wife understood.

## 2012-05-21 ENCOUNTER — Other Ambulatory Visit (HOSPITAL_BASED_OUTPATIENT_CLINIC_OR_DEPARTMENT_OTHER): Payer: Medicare Other | Admitting: Lab

## 2012-05-21 ENCOUNTER — Ambulatory Visit (HOSPITAL_BASED_OUTPATIENT_CLINIC_OR_DEPARTMENT_OTHER): Payer: Medicare Other | Admitting: Pharmacist

## 2012-05-21 DIAGNOSIS — I513 Intracardiac thrombosis, not elsewhere classified: Secondary | ICD-10-CM

## 2012-05-21 DIAGNOSIS — I219 Acute myocardial infarction, unspecified: Secondary | ICD-10-CM

## 2012-05-21 LAB — PROTIME-INR: INR: 4.8 — ABNORMAL HIGH (ref 2.00–3.50)

## 2012-05-21 LAB — POCT INR: INR: 4.8

## 2012-05-21 NOTE — Progress Notes (Signed)
INR remains elevated despite vitamin K 2.5 mg on 12/3. Repeat vitamin K 2.5 mg PO x1 today in office. Continue holding coumadin. Return tomorrow 12/5 at 2:45pm for lab and 3:00pm for coumadin clinic to verify INR is still going down. Pt appetite has been poor and likely reason for elevation. Pt has multiple petechiae and bruises (some large) over his arms. A few bruises on his legs. No headaches or abdominal pain. No blood in urine or stool.

## 2012-05-21 NOTE — Patient Instructions (Addendum)
Take Vit K sample tab today in clinic No coumadin today Return tomorrow @ 2:45pm for lab and coumadin

## 2012-05-22 ENCOUNTER — Ambulatory Visit (HOSPITAL_BASED_OUTPATIENT_CLINIC_OR_DEPARTMENT_OTHER): Payer: Medicare Other | Admitting: Pharmacist

## 2012-05-22 ENCOUNTER — Ambulatory Visit (HOSPITAL_BASED_OUTPATIENT_CLINIC_OR_DEPARTMENT_OTHER): Payer: Medicare Other | Admitting: Lab

## 2012-05-22 DIAGNOSIS — I219 Acute myocardial infarction, unspecified: Secondary | ICD-10-CM

## 2012-05-22 DIAGNOSIS — I513 Intracardiac thrombosis, not elsewhere classified: Secondary | ICD-10-CM

## 2012-05-22 DIAGNOSIS — Z7901 Long term (current) use of anticoagulants: Secondary | ICD-10-CM

## 2012-05-22 DIAGNOSIS — Z5181 Encounter for therapeutic drug level monitoring: Secondary | ICD-10-CM

## 2012-05-22 LAB — PROTIME-INR: INR: 2.9 (ref 2.00–3.50)

## 2012-05-22 NOTE — Patient Instructions (Addendum)
INR now down to 2.9 (at goal) Hold coumadin again today Then start taking half a tablet (2.5 mg) on Friday Return Monday 12/9 at 1:30 pm

## 2012-05-22 NOTE — Progress Notes (Signed)
Pt INR now back to within Goal range at 2.9. Pt still has bruising on arms and legs but appears to be getting better. Pt took vit K po 2.5 mg on 12/3 and 12/4 while INR was elevated. Pt was on 5 mg daily with 7.5mg  on thursdays. Pt has had no appetite due to fatigue and chemotherapy treatment. Instructed patient to hold coumadin again tonight and then resume coumadin on Friday at 2.5 mg daily. Will see patient back on Monday 12/9.

## 2012-05-26 ENCOUNTER — Ambulatory Visit (HOSPITAL_BASED_OUTPATIENT_CLINIC_OR_DEPARTMENT_OTHER): Payer: Medicare Other | Admitting: Pharmacist

## 2012-05-26 ENCOUNTER — Other Ambulatory Visit (HOSPITAL_BASED_OUTPATIENT_CLINIC_OR_DEPARTMENT_OTHER): Payer: Medicare Other | Admitting: Lab

## 2012-05-26 DIAGNOSIS — I513 Intracardiac thrombosis, not elsewhere classified: Secondary | ICD-10-CM

## 2012-05-26 DIAGNOSIS — I219 Acute myocardial infarction, unspecified: Secondary | ICD-10-CM

## 2012-05-26 LAB — POCT INR: INR: 1.6

## 2012-05-26 NOTE — Progress Notes (Signed)
INR = 1.6 after 3 doses of Coumadin 2.5mg .  Pt had previously had 2 doses of oral vitamin k and holding coumadin after an INR=9.  Pt and wife state that appetite had decreased greatly after last chemo.  Will try Coumadin dose of 5mg  MWF and 2.5mg  other days.  Will check PT/INR in 1 week.

## 2012-05-30 ENCOUNTER — Ambulatory Visit (HOSPITAL_BASED_OUTPATIENT_CLINIC_OR_DEPARTMENT_OTHER): Payer: Medicare Other | Admitting: Lab

## 2012-05-30 ENCOUNTER — Ambulatory Visit (HOSPITAL_BASED_OUTPATIENT_CLINIC_OR_DEPARTMENT_OTHER): Payer: Medicare Other | Admitting: Pharmacist

## 2012-05-30 DIAGNOSIS — I219 Acute myocardial infarction, unspecified: Secondary | ICD-10-CM

## 2012-05-30 DIAGNOSIS — I513 Intracardiac thrombosis, not elsewhere classified: Secondary | ICD-10-CM

## 2012-05-30 LAB — PROTIME-INR: INR: 1.8 — ABNORMAL LOW (ref 2.00–3.50)

## 2012-05-30 NOTE — Progress Notes (Signed)
Pt seen at request of wife.  She was concerned the bruising on his hands has returned since they restarted his coumadin.  His INR today was 1.8.  Will Continue Coumadin dose to 5mg  on Monday, Wednesday and fridays and 2.5mg  other days.  Will check PT/INR as already scheduled on 06/03/12 at 10am for lab and 1015 for coumadin clinic.

## 2012-05-30 NOTE — Patient Instructions (Signed)
Continue Coumadin dose to 5mg  on Monday, Wednesday and fridays and 2.5mg  other days.  Will check PT/INR on 06/03/12 at 10am for lab and 1015 for coumadin clinic.

## 2012-06-02 ENCOUNTER — Other Ambulatory Visit: Payer: Self-pay | Admitting: Internal Medicine

## 2012-06-02 DIAGNOSIS — C189 Malignant neoplasm of colon, unspecified: Secondary | ICD-10-CM

## 2012-06-02 DIAGNOSIS — C787 Secondary malignant neoplasm of liver and intrahepatic bile duct: Secondary | ICD-10-CM

## 2012-06-03 ENCOUNTER — Other Ambulatory Visit (HOSPITAL_BASED_OUTPATIENT_CLINIC_OR_DEPARTMENT_OTHER): Payer: Medicare Other

## 2012-06-03 ENCOUNTER — Ambulatory Visit (HOSPITAL_BASED_OUTPATIENT_CLINIC_OR_DEPARTMENT_OTHER): Payer: Medicare Other | Admitting: Pharmacist

## 2012-06-03 ENCOUNTER — Ambulatory Visit (HOSPITAL_COMMUNITY)
Admission: RE | Admit: 2012-06-03 | Discharge: 2012-06-03 | Disposition: A | Discharge status: Home / Self Care | Payer: Medicare Other | Source: Ambulatory Visit | Attending: Internal Medicine | Admitting: Internal Medicine

## 2012-06-03 ENCOUNTER — Other Ambulatory Visit: Payer: Medicare Other

## 2012-06-03 DIAGNOSIS — Z7901 Long term (current) use of anticoagulants: Secondary | ICD-10-CM

## 2012-06-03 DIAGNOSIS — I219 Acute myocardial infarction, unspecified: Secondary | ICD-10-CM

## 2012-06-03 DIAGNOSIS — I517 Cardiomegaly: Secondary | ICD-10-CM | POA: Insufficient documentation

## 2012-06-03 DIAGNOSIS — I708 Atherosclerosis of other arteries: Secondary | ICD-10-CM | POA: Insufficient documentation

## 2012-06-03 DIAGNOSIS — I2510 Atherosclerotic heart disease of native coronary artery without angina pectoris: Secondary | ICD-10-CM | POA: Insufficient documentation

## 2012-06-03 DIAGNOSIS — C7951 Secondary malignant neoplasm of bone: Secondary | ICD-10-CM | POA: Insufficient documentation

## 2012-06-03 DIAGNOSIS — I513 Intracardiac thrombosis, not elsewhere classified: Secondary | ICD-10-CM

## 2012-06-03 DIAGNOSIS — Z79899 Other long term (current) drug therapy: Secondary | ICD-10-CM | POA: Insufficient documentation

## 2012-06-03 DIAGNOSIS — C786 Secondary malignant neoplasm of retroperitoneum and peritoneum: Secondary | ICD-10-CM | POA: Insufficient documentation

## 2012-06-03 DIAGNOSIS — T451X5A Adverse effect of antineoplastic and immunosuppressive drugs, initial encounter: Secondary | ICD-10-CM

## 2012-06-03 DIAGNOSIS — C7800 Secondary malignant neoplasm of unspecified lung: Secondary | ICD-10-CM | POA: Insufficient documentation

## 2012-06-03 DIAGNOSIS — C787 Secondary malignant neoplasm of liver and intrahepatic bile duct: Secondary | ICD-10-CM | POA: Insufficient documentation

## 2012-06-03 DIAGNOSIS — C180 Malignant neoplasm of cecum: Secondary | ICD-10-CM | POA: Insufficient documentation

## 2012-06-03 DIAGNOSIS — D701 Agranulocytosis secondary to cancer chemotherapy: Secondary | ICD-10-CM

## 2012-06-03 LAB — CBC WITH DIFFERENTIAL/PLATELET
Basophils Absolute: 0 10*3/uL (ref 0.0–0.1)
Eosinophils Absolute: 0 10*3/uL (ref 0.0–0.5)
HGB: 13.1 g/dL (ref 13.0–17.1)
LYMPH%: 6.7 % — ABNORMAL LOW (ref 14.0–49.0)
MCH: 36.5 pg — ABNORMAL HIGH (ref 27.2–33.4)
MCV: 108.7 fL — ABNORMAL HIGH (ref 79.3–98.0)
MONO%: 9.3 % (ref 0.0–14.0)
NEUT#: 10 10*3/uL — ABNORMAL HIGH (ref 1.5–6.5)
Platelets: 195 10*3/uL (ref 140–400)
RBC: 3.58 10*6/uL — ABNORMAL LOW (ref 4.20–5.82)

## 2012-06-03 LAB — PROTIME-INR
INR: 2.4 (ref 2.00–3.50)
Protime: 28.8 Seconds — ABNORMAL HIGH (ref 10.6–13.4)

## 2012-06-03 LAB — COMPREHENSIVE METABOLIC PANEL (CC13)
CO2: 26 mEq/L (ref 22–29)
Creatinine: 0.8 mg/dL (ref 0.7–1.3)
Glucose: 110 mg/dl — ABNORMAL HIGH (ref 70–99)
Total Bilirubin: 0.88 mg/dL (ref 0.20–1.20)

## 2012-06-03 MED ORDER — IOHEXOL 300 MG/ML  SOLN
100.0000 mL | Freq: Once | INTRAMUSCULAR | Status: AC | PRN
  Administered 2012-06-03: 100 mL via INTRAVENOUS

## 2012-06-03 NOTE — Progress Notes (Signed)
INR therapeutic today (2.4) on 2.5mg  daily except 5mg  on MWF.   Pt seems to finally returned to therapeutic range after he was supratherapeutic for so long.  No changes in meds.  Not eating well per wife.   No significant issues with bleeding or bruising.  Discoloration of skin (initially thought to be bruises by pt's wife, Dr. Arbutus Ped confirmed that these marks were related to discoloration d/t chemotherapy) has been improving since coming off chemo. No other complaints.  Pt and wife plan to speak to Dr. Jacinto Halim about coming off Coumadin when they have their next appt with him on 06/16/12.  His most recent ECHO did not find evidence of a ventricular mural thrombus anymore per pt.  Will continue current dose, and recheck INR in 1 week to ensure pt stays in therapeutic range.

## 2012-06-05 ENCOUNTER — Ambulatory Visit (HOSPITAL_BASED_OUTPATIENT_CLINIC_OR_DEPARTMENT_OTHER): Payer: Medicare Other | Admitting: Internal Medicine

## 2012-06-05 ENCOUNTER — Encounter: Payer: Self-pay | Admitting: Internal Medicine

## 2012-06-05 ENCOUNTER — Telehealth: Payer: Self-pay | Admitting: Internal Medicine

## 2012-06-05 ENCOUNTER — Telehealth: Payer: Self-pay | Admitting: *Deleted

## 2012-06-05 VITALS — BP 135/80 | HR 81 | Temp 97.0°F | Resp 18 | Ht 71.0 in | Wt 136.4 lb

## 2012-06-05 DIAGNOSIS — C7800 Secondary malignant neoplasm of unspecified lung: Secondary | ICD-10-CM

## 2012-06-05 DIAGNOSIS — C787 Secondary malignant neoplasm of liver and intrahepatic bile duct: Secondary | ICD-10-CM

## 2012-06-05 DIAGNOSIS — C189 Malignant neoplasm of colon, unspecified: Secondary | ICD-10-CM

## 2012-06-05 DIAGNOSIS — C7951 Secondary malignant neoplasm of bone: Secondary | ICD-10-CM

## 2012-06-05 DIAGNOSIS — C180 Malignant neoplasm of cecum: Secondary | ICD-10-CM

## 2012-06-05 NOTE — Telephone Encounter (Signed)
Per staff message and POF I have scheuduled appts. JMW

## 2012-06-05 NOTE — Telephone Encounter (Signed)
gv and printed appt schedule for pt for Dec and Jan...emailed michelle to add chemo..the patient aware

## 2012-06-05 NOTE — Progress Notes (Signed)
[facility] [phone]   [phone]  OFFICE PROGRESS NOTE  [name], FNP [address] [place] [address]  DIAGNOSIS:  1) Metastatic colon adenocarcinoma (KRAS mutation) with metastatic lesion to the brain, lung, liver and bone diagnosed in May of 2013.  2) left ventricular mural thrombus incidentally diagnosed on recent CT scan of the chest on 02/28/2012.   PRIOR THERAPY:  1) Status post stereotactic radiosurgery to 2 brain lesions. 2) Systemic chemotherapy with FOLFOX/Avastin status post 11 cycles.   CURRENT THERAPY:  1) Coumadin 5 mg by mouth daily for the recently diagnosed left ventricular mural thrombus.   INTERVAL HISTORY: Joseph Rowland 78 y.o. male returns to the clinic today for followup visit accompanied by several family members. The patient has some improvement in his general condition of uptake in the last 2 weeks off chemotherapy. He continues to have mild fatigue and lack of appetite but he did not lose any weight since his last visit. He has mild to moderate peripheral neuropathy secondary to previous chemotherapy with oxaliplatin. The patient denied having any significant nausea or vomiting. He denied having any significant abdominal pain, diarrhea or constipation. He has no fever or chills. He denied having any significant chest pain, shortness breath, cough or hemoptysis. The patient had repeat CT scan of the chest, abdomen and pelvis performed recently and he is here for evaluation and discussion of his scan results.  MEDICAL HISTORY: Past Medical History  Diagnosis Date  . Lung mass   . Liver cancer   . metastatic adenocarcinoma 11/14/11    liver bx=metastatic adenocarcino44ma with tumor necrosis kras  mutation and involving brain,lung,and bones  . Brain cancer     lesions in occipital lobes,b/l suspicious mets  . Lung cancer     b/l multifocal pulmonary mets  . Hypertension   . Anemia   . History  of radiation therapy 12/07/11    SRS single fraction palliative 2 intracranial lesions  . History of chemotherapy 01/07/12    FOLFAX/AVASTIN s/p 3 cycles   . Mitral regurgitation   . Dilated cardiomyopathy   . Shortness of breath     on exertion  . Chemotherapy induced neutropenia 05/20/2012    ALLERGIES:  is allergic to dronabinol.  MEDICATIONS:  Current Outpatient Prescriptions  Medication Sig Dispense Refill  . Alum & Mag Hydroxide-Simeth (MAGIC MOUTHWASH) SOLN Swish and spit 5 mLs 4 (four) times daily.      Marland Kitchen aspirin EC 81 MG tablet Take 81 mg by mouth daily.      . carvedilol (COREG) 3.125 MG tablet       . cholecalciferol (VITAMIN D) 1000 UNITS tablet Take 1,000 Units by mouth daily.      . digoxin (LANOXIN) 0.125 MG tablet       . FeFum-FePoly-FA-B Cmp-C-Biot (INTEGRA PLUS) CAPS Take 1 capsule by mouth every morning.  30 capsule  2  . ferrous sulfate 325 (65 FE) MG tablet Take 325 mg by mouth daily.       Marland Kitchen HYDROcodone-acetaminophen (LORTAB) 7.5-500 MG/15ML solution Take 7.5 mg by mouth Every 6 hours as needed.      . lidocaine-prilocaine (EMLA) cream Apply 1 application topically as needed. For port-a-cath access.      Marland Kitchen lisinopril (PRINIVIL,ZESTRIL) 5 MG tablet       . Multiple Vitamin (MULITIVITAMIN) LIQD Take 10 mLs by mouth daily. Pt uses Vitawave.      . Nutritional Supplements (NUTRITIONAL SUPPLEMENT PO)  Take 2 capsules by mouth daily. Proprietary blend includes: Administrator parts, fennel seeds, feverfew aerial parts, Hops flowers, chamomile flowers extract, marshmallow root extract      . potassium chloride (K-DUR,KLOR-CON) 10 MEQ tablet       . PRESCRIPTION MEDICATION Inject into the vein every 14 (fourteen) days. Avastin, Leucovorin, Adrucil, Eloxatin      . prochlorperazine (COMPAZINE) 10 MG tablet TAKE (1) TABLET EVERY SIX HOURS AS NEEDED.  60 tablet  2  . vitamin B-12 (CYANOCOBALAMIN) 250 MCG tablet Take 250 mcg by mouth daily.      Marland Kitchen warfarin (COUMADIN) 5  MG tablet Take 5mg  PO daily except 2.5mg  on Sunday and Thursday, or as directed.  30 tablet  3    SURGICAL HISTORY:  Past Surgical History  Procedure Date  . Hernia repair   . Right port a cath     power port right subclavian  . Colonoscopy 12/06/2011    Procedure: COLONOSCOPY;  Surgeon: Louis Meckel, MD;  Location: [facility];  Service: Endoscopy;  Laterality: N/A;  . Colonoscopy 12/06/2011    Procedure: COLONOSCOPY;  Surgeon: Louis Meckel, MD;  Location: [facility];  Service: Endoscopy;  Laterality: N/A;    REVIEW OF SYSTEMS:  A comprehensive review of systems was negative except for: Constitutional: positive for anorexia and fatigue   PHYSICAL EXAMINATION: General appearance: alert, cooperative and no distress Head: Normocephalic, without obvious abnormality, atraumatic Neck: no adenopathy Lymph nodes: Cervical, supraclavicular, and axillary nodes normal. Resp: clear to auscultation bilaterally Cardio: regular rate and rhythm, S1, S2 normal, no murmur, click, rub or gallop GI: soft, non-tender; bowel sounds normal; no masses,  no organomegaly Extremities: extremities normal, atraumatic, no cyanosis or edema Neurologic: Alert and oriented X 3, normal strength and tone. Normal symmetric reflexes. Normal coordination and gait  ECOG PERFORMANCE STATUS: 2 - Symptomatic, <50% confined to bed  Blood pressure 135/80, pulse 81, temperature 97 F (36.1 C), temperature source Oral, resp. rate 18, height 5\' 11"  (1.803 m), weight 136 lb 6.4 oz (61.871 kg).  LABORATORY DATA: Lab Results  Component Value Date   WBC 12.0* 06/03/2012   HGB 13.1 06/03/2012   HCT 38.9 06/03/2012   MCV 108.7* 06/03/2012   PLT 195 06/03/2012      Chemistry      Component Value Date/Time   NA 136 06/03/2012 1046   NA 134* 02/28/2012 1638   K 4.1 06/03/2012 1046   K 3.9 02/28/2012 1638   CL 102 06/03/2012 1046   CL 100 02/28/2012 1638   CO2 26 06/03/2012 1046   CO2 27 02/28/2012 1638   BUN 19.0  06/03/2012 1046   BUN 11 02/28/2012 1638   CREATININE 0.8 06/03/2012 1046   CREATININE 0.90 02/28/2012 1638      Component Value Date/Time   CALCIUM 9.2 06/03/2012 1046   CALCIUM 9.2 02/28/2012 1638   ALKPHOS 201* 06/03/2012 1046   ALKPHOS 153* 02/04/2012 1034   AST 44* 06/03/2012 1046   AST 36 02/04/2012 1034   ALT 28 06/03/2012 1046   ALT 22 02/04/2012 1034   BILITOT 0.88 06/03/2012 1046   BILITOT 0.3 02/04/2012 1034       RADIOGRAPHIC STUDIES: Ct Chest W Contrast  06/03/2012  *RADIOLOGY REPORT*  Clinical Data:  Metastatic colon carcinoma diagnosed 3/13 with chemotherapy ongoing.  CT CHEST, ABDOMEN AND PELVIS WITH CONTRAST  Technique: Contiguous axial images of the chest abdomen and pelvis were obtained after IV contrast administration.  Contrast: 100  ml Omnipaque-300  Comparison: 02/28/2012   CT CHEST  Findings: Lung windows demonstrate probable secretions along the right side trachea.  Bilateral pulmonary metastasis as follows: - Right lower lobe nodule which measures 2.5 x 2.3 cm on image 48/series 5 compared with 2.7 x 2.4 cm on the prior. - More medial right lower lobe 1.0 cm nodule on image 44/series 5 versus 1.1 cm on the prior. - 1.4 cm left upper lobe nodule on image 30/series 5 versus 1.3 cm on the prior. - Pleural-based left lower lobe nodule which measures 2.2 x 1.4 cm on image 37/series 5 versus 2.1 x 1.4 cm on the prior exam. - 5 mm left lower lobe nodule on image 36/series 5 is similar to slightly less conspicuous than on the prior.  Other scattered nodules which are felt to be similar.  Soft tissue windows demonstrate no supraclavicular adenopathy. Right-sided Port-A-Cath which terminates in the right atrium on supine positioning.  Moderate atherosclerotic narrowing of the proximal left subclavian artery on image 13/series 2.  Mild cardiomegaly.  Multivessel coronary artery atherosclerosis. The possible left apical thrombus described on the prior exam is not readily apparent  today.  There is mild lipomatous hypertrophy of the interatrial septum.  No pericardial effusion.  Resolved small right pleural effusion. No central pulmonary embolism, on this non-dedicated study.  High right paratracheal node measures 6 mm today versus 9 mm on the prior. No mediastinal or hilar adenopathy.  IMPRESSION:  1.  Similar pulmonary metastatic burden. 2.  No new or progressive disease within the chest. 3.  Resolution of small right pleural effusion.   CT ABDOMEN AND PELVIS  Findings:  Extensive hepatic metastasis. -  Index right hepatic lobe lesion measures 4.5 x 3.4 cm on image 73/series 2 compare 4.6 x 3.5 cm on the prior. - Index high medial segment left liver lobe lesion measures 2.3 x 3.5 cm on image 56/series 2 versus 3.6 x 2.3 cm on the prior.  No enlarging lesions.  Normal spleen, stomach, pancreas.  There is probable gallbladder sludge or stones, including on image 77/series 2.  The gallbladder mucosa is mildly hyperenhancing; new finding. There is minimal pericholecystic fluid, nonspecific in the setting of small volume ascites.  No biliary ductal dilatation.  Normal adrenal glands.  Normal right kidney.  Scattered left renal cysts.  An aortocaval node measures 8 mm on image 73/series 2 and is unchanged.  Not pathologic by size criteria.  Colonic stool burden suggests constipation.  Apparent ascending colonic wall thickening which is favored to be due to underdistension.  Cecal primary again seen on image 102/series 2.  No obstruction.  Normal small bowel caliber.  Redemonstration of omental/peritoneal disease.  Example within the perihepatic space on image 77/series 2.  Adenopathy in the ileocolic mesentery at 1.2 cm on image 98/series 2.  Similar when remeasured. No pneumatosis or free intraperitoneal air.  No pelvic sidewall adenopathy.  Normal urinary bladder and prostate.  Moderate volume cul-de-sac fluid which is increased. Lytic lesion within the L5 vertebral body which appears similar.  Mild superior endplate compression deformity.  IMPRESSION:  1.  Similar hepatic metastasis. 2.  Omental/peritoneal metastasis with increased abdominal pelvic ascites. 3.  Cecal primary with adjacent localized nodal metastasis.  No obstruction. 4.  Gallstones or sludge with pericholecystic fluid and mucosal hyperenhancement.  Nonspecific in the setting of ascites.  If there are right upper quadrant symptoms, recommend ultrasound. 5.  L5 osseous metastasis with mild pathologic compression deformity, grossly similar.  Original Report Authenticated By: Jeronimo Greaves, M.D.        ASSESSMENT: This is a very pleasant 78 years old white male with metastatic colon adenocarcinoma status post one cycle of systemic chemotherapy with FOLFOX/Avastin and the patient tolerated his treatment fairly well except for mild to moderate peripheral neuropathy in addition to fatigue and lack of appetite. His recent CT scan of the chest abdomen and pelvis showed no significant evidence for disease progression.  PLAN: I discussed the scan results with the patient and his family. I recommended for him to take a break from the chemotherapy until after the holiday season. I would consider him to continue treatment with 5-FU in addition to Avastin every 2 weeks until disease progression or evidence of toxicity. We will discontinue oxaliplatin at this point because of the worsening peripheral neuropathy. He is expected to start the first cycle of the new regimen in 3 weeks. He was advised to call me immediately if she has any concerning symptoms in the interval.  The patient and his family agreed to the current plan.  All questions were answered. The patient knows to call the clinic with any problems, questions or concerns. We can certainly see the patient much sooner if necessary.  I spent 20 minutes counseling the patient face to face. The total time spent in the appointment was 30 minutes.

## 2012-06-07 NOTE — Patient Instructions (Signed)
Your scan showed no significant evidence for disease progression. Will continue on a chemotherapy break for the next 3 weeks. We discussed treatment with 5-FU and Avastin after the holiday.

## 2012-06-09 ENCOUNTER — Ambulatory Visit (HOSPITAL_BASED_OUTPATIENT_CLINIC_OR_DEPARTMENT_OTHER): Payer: Medicare Other | Admitting: Pharmacist

## 2012-06-09 ENCOUNTER — Other Ambulatory Visit (HOSPITAL_BASED_OUTPATIENT_CLINIC_OR_DEPARTMENT_OTHER): Payer: Medicare Other | Admitting: Lab

## 2012-06-09 ENCOUNTER — Telehealth: Payer: Self-pay | Admitting: Internal Medicine

## 2012-06-09 ENCOUNTER — Telehealth: Payer: Self-pay | Admitting: *Deleted

## 2012-06-09 DIAGNOSIS — Z7901 Long term (current) use of anticoagulants: Secondary | ICD-10-CM

## 2012-06-09 DIAGNOSIS — I513 Intracardiac thrombosis, not elsewhere classified: Secondary | ICD-10-CM

## 2012-06-09 DIAGNOSIS — I219 Acute myocardial infarction, unspecified: Secondary | ICD-10-CM

## 2012-06-09 NOTE — Telephone Encounter (Signed)
Pt came by and needed to change start date for chemo for Tuesdays.Joseph KitchenMarland KitchenDr. Kerry Fort said ok to move...emailed michelle to add tx...the patient gonna come back to pick up update schedule.

## 2012-06-09 NOTE — Patient Instructions (Signed)
INR subtherapeutic at 1.4 today. Wife says this is due to his diet. Take Coumadin 5mg  tomorrow (Tuesday) instead of 2.5mg , then resume normal regimen of  2.5mg  daily except 5mg  on MWF.  Recheck INR on 06/23/12 at

## 2012-06-09 NOTE — Telephone Encounter (Signed)
Per staff message and POF I have schedueld appts.  JMW  

## 2012-06-09 NOTE — Progress Notes (Signed)
Pt's INR subtherapeutic today after being therapeutic just last week. Wife states this is probably due to his diet. He had some vegetable soup last night. I advised patient to take Coumadin 5mg  tomorrow (Tuesday) instead of the normal 2.5mg , then resume his normal regimen of  2.5mg  daily except 5mg  on MWF. I advised wife to encourage patient to keep his diet consistent. Pt has an appointment with Dr. Jacinto Halim on 12/30 to discuss stopping Coumadin. If Coumadin is stopped, patient will call to cancel future appt. For now, I have scheduled to recheck his INR on 06/23/12

## 2012-06-09 NOTE — Telephone Encounter (Signed)
Pt could not wait for schedule and requested that i mail her the appt schedule for Jan 2014.Marland KitchenMarland KitchenMarland KitchenDone

## 2012-06-12 ENCOUNTER — Encounter: Payer: Self-pay | Admitting: Medical Oncology

## 2012-06-23 ENCOUNTER — Ambulatory Visit (HOSPITAL_BASED_OUTPATIENT_CLINIC_OR_DEPARTMENT_OTHER): Payer: Medicare Other | Admitting: Internal Medicine

## 2012-06-23 ENCOUNTER — Encounter: Payer: Self-pay | Admitting: Internal Medicine

## 2012-06-23 ENCOUNTER — Ambulatory Visit: Payer: Medicare Other | Admitting: Pharmacist

## 2012-06-23 ENCOUNTER — Other Ambulatory Visit (HOSPITAL_BASED_OUTPATIENT_CLINIC_OR_DEPARTMENT_OTHER): Payer: Medicare Other

## 2012-06-23 VITALS — BP 130/63 | HR 72 | Temp 97.3°F | Resp 18 | Ht 71.0 in | Wt 133.7 lb

## 2012-06-23 DIAGNOSIS — I513 Intracardiac thrombosis, not elsewhere classified: Secondary | ICD-10-CM

## 2012-06-23 DIAGNOSIS — C189 Malignant neoplasm of colon, unspecified: Secondary | ICD-10-CM

## 2012-06-23 DIAGNOSIS — C180 Malignant neoplasm of cecum: Secondary | ICD-10-CM

## 2012-06-23 DIAGNOSIS — C787 Secondary malignant neoplasm of liver and intrahepatic bile duct: Secondary | ICD-10-CM

## 2012-06-23 DIAGNOSIS — I219 Acute myocardial infarction, unspecified: Secondary | ICD-10-CM

## 2012-06-23 DIAGNOSIS — C7800 Secondary malignant neoplasm of unspecified lung: Secondary | ICD-10-CM

## 2012-06-23 DIAGNOSIS — Z86718 Personal history of other venous thrombosis and embolism: Secondary | ICD-10-CM

## 2012-06-23 LAB — COMPREHENSIVE METABOLIC PANEL (CC13)
Albumin: 1.8 g/dL — ABNORMAL LOW (ref 3.5–5.0)
Alkaline Phosphatase: 290 U/L — ABNORMAL HIGH (ref 40–150)
CO2: 26 mEq/L (ref 22–29)
Calcium: 10 mg/dL (ref 8.4–10.4)
Chloride: 100 mEq/L (ref 98–107)
Glucose: 124 mg/dl — ABNORMAL HIGH (ref 70–99)
Potassium: 4.3 mEq/L (ref 3.5–5.1)
Sodium: 136 mEq/L (ref 136–145)
Total Protein: 7 g/dL (ref 6.4–8.3)

## 2012-06-23 LAB — CBC WITH DIFFERENTIAL/PLATELET
Eosinophils Absolute: 0.3 10*3/uL (ref 0.0–0.5)
HGB: 12.9 g/dL — ABNORMAL LOW (ref 13.0–17.1)
MONO#: 1.3 10*3/uL — ABNORMAL HIGH (ref 0.1–0.9)
MONO%: 11.2 % (ref 0.0–14.0)
NEUT#: 8.8 10*3/uL — ABNORMAL HIGH (ref 1.5–6.5)
RBC: 3.61 10*6/uL — ABNORMAL LOW (ref 4.20–5.82)
RDW: 17 % — ABNORMAL HIGH (ref 11.0–14.6)
WBC: 11.2 10*3/uL — ABNORMAL HIGH (ref 4.0–10.3)
lymph#: 0.8 10*3/uL — ABNORMAL LOW (ref 0.9–3.3)

## 2012-06-23 LAB — PROTIME-INR
INR: 1.4 — ABNORMAL LOW (ref 2.00–3.50)
Protime: 16.8 Seconds — ABNORMAL HIGH (ref 10.6–13.4)

## 2012-06-23 NOTE — Patient Instructions (Signed)
Lab work is good today to proceed with chemotherapy tomorrow as scheduled. Followup in 2 weeks.

## 2012-06-23 NOTE — Progress Notes (Signed)
[facility] Telephone:([phone]   [phone]  OFFICE PROGRESS NOTE  [name], FNP [address] [place] [address]  DIAGNOSIS:  1) Metastatic colon adenocarcinoma (KRAS mutation) with metastatic lesion to the brain, lung, liver and bone diagnosed in May of 2013.  2) left ventricular mural thrombus incidentally diagnosed on recent CT scan of the chest on 02/28/2012.   PRIOR THERAPY:  1) Status post stereotactic radiosurgery to 2 brain lesions.  2) Systemic chemotherapy with FOLFOX/Avastin status post 11 cycles.   CURRENT THERAPY:  1) continuous infusion 5-FU and leucovorin with Avastin every 2 weeks. First cycle on 06/24/2002. 2) Coumadin 5 mg by mouth daily for the recently diagnosed left ventricular mural thrombus.  INTERVAL HISTORY: Joseph Rowland 79 y.o. male returns to the clinic today for followup visit accompanied by his wife. The patient is still complaining of increasing fatigue and weakness. He also continues to have poor appetite and eats small meals daily. He has been off chemotherapy for the last 4 weeks to enjoy the holiday with his family, his last scan showed no evidence for disease progression. The patient is expected to start the first cycle of systemic chemotherapy with continuous infusion 5-FU on leucovorin tomorrow. He denied having any significant nausea or vomiting. He denied having any significant chest pain, shortness breath, cough or hemoptysis.  MEDICAL HISTORY: Past Medical History  Diagnosis Date  . Lung mass   . Liver cancer   . metastatic adenocarcinoma 11/14/11    liver bx=metastatic adenocarcino71ma with tumor necrosis kras  mutation and involving brain,lung,and bones  . Brain cancer     lesions in occipital lobes,b/l suspicious mets  . Lung cancer     b/l multifocal pulmonary mets  . Hypertension   . Anemia   . History of radiation therapy 12/07/11    SRS single fraction palliative 2 intracranial  lesions  . History of chemotherapy 01/07/12    FOLFAX/AVASTIN s/p 3 cycles   . Mitral regurgitation   . Dilated cardiomyopathy   . Shortness of breath     on exertion  . Chemotherapy induced neutropenia 05/20/2012    ALLERGIES:  is allergic to dronabinol.  MEDICATIONS:  Current Outpatient Prescriptions  Medication Sig Dispense Refill  . aspirin EC 81 MG tablet Take 81 mg by mouth daily.      . carvedilol (COREG) 3.125 MG tablet       . cholecalciferol (VITAMIN D) 1000 UNITS tablet Take 1,000 Units by mouth daily.      . digoxin (LANOXIN) 0.125 MG tablet       . FeFum-FePoly-FA-B Cmp-C-Biot (INTEGRA PLUS) CAPS Take 1 capsule by mouth every morning.  30 capsule  2  . lidocaine-prilocaine (EMLA) cream Apply 1 application topically as needed. For port-a-cath access.      Marland Kitchen lisinopril (PRINIVIL,ZESTRIL) 5 MG tablet       . vitamin B-12 (CYANOCOBALAMIN) 250 MCG tablet Take 250 mcg by mouth daily.      . Alum & Mag Hydroxide-Simeth (MAGIC MOUTHWASH) SOLN Swish and spit 5 mLs 4 (four) times daily.      Marland Kitchen HYDROcodone-acetaminophen (LORTAB) 7.5-500 MG/15ML solution Take 7.5 mg by mouth Every 6 hours as needed.      . potassium chloride (K-DUR,KLOR-CON) 10 MEQ tablet       . PRESCRIPTION MEDICATION Inject into the vein every 14 (fourteen) days. Avastin, Leucovorin, Adrucil, Eloxatin      . warfarin (COUMADIN) 5 MG tablet Take 5mg   PO daily except 2.5mg  on Sunday and Thursday, or as directed.  30 tablet  3    SURGICAL HISTORY:  Past Surgical History  Procedure Date  . Hernia repair   . Right port a cath     power port right subclavian  . Colonoscopy 12/06/2011    Procedure: COLONOSCOPY;  Surgeon: Louis Meckel, MD;  Location: [facility];  Service: Endoscopy;  Laterality: N/A;  . Colonoscopy 12/06/2011    Procedure: COLONOSCOPY;  Surgeon: Louis Meckel, MD;  Location: [facility];  Service: Endoscopy;  Laterality: N/A;    REVIEW OF SYSTEMS:  A comprehensive review of systems was  negative except for: Constitutional: positive for anorexia, fatigue, weight loss and Lack of appetite   PHYSICAL EXAMINATION: General appearance: alert, cooperative and no distress Head: Normocephalic, without obvious abnormality, atraumatic Neck: no adenopathy Lymph nodes: Cervical, supraclavicular, and axillary nodes normal. Resp: clear to auscultation bilaterally Cardio: regular rate and rhythm, S1, S2 normal, no murmur, click, rub or gallop GI: soft, non-tender; bowel sounds normal; no masses,  no organomegaly Extremities: extremities normal, atraumatic, no cyanosis or edema Neurologic: Alert and oriented X 3, normal strength and tone. Normal symmetric reflexes. Normal coordination and gait  ECOG PERFORMANCE STATUS: 2 - Symptomatic, <50% confined to bed  Blood pressure 130/63, pulse 72, temperature 97.3 F (36.3 C), temperature source Oral, resp. rate 18, height 5\' 11"  (1.803 m), weight 133 lb 11.2 oz (60.646 kg).  LABORATORY DATA: Lab Results  Component Value Date   WBC 11.2* 06/23/2012   HGB 12.9* 06/23/2012   HCT 38.6 06/23/2012   MCV 106.8* 06/23/2012   PLT 262 06/23/2012      Chemistry      Component Value Date/Time   NA 136 06/03/2012 1046   NA 134* 02/28/2012 1638   K 4.1 06/03/2012 1046   K 3.9 02/28/2012 1638   CL 102 06/03/2012 1046   CL 100 02/28/2012 1638   CO2 26 06/03/2012 1046   CO2 27 02/28/2012 1638   BUN 19.0 06/03/2012 1046   BUN 11 02/28/2012 1638   CREATININE 0.8 06/03/2012 1046   CREATININE 0.90 02/28/2012 1638      Component Value Date/Time   CALCIUM 9.2 06/03/2012 1046   CALCIUM 9.2 02/28/2012 1638   ALKPHOS 201* 06/03/2012 1046   ALKPHOS 153* 02/04/2012 1034   AST 44* 06/03/2012 1046   AST 36 02/04/2012 1034   ALT 28 06/03/2012 1046   ALT 22 02/04/2012 1034   BILITOT 0.88 06/03/2012 1046   BILITOT 0.3 02/04/2012 1034       ASSESSMENT: This is a very pleasant 79 years old white male with metastatic colon adenocarcinoma status post 11 cycles of systemic  chemotherapy with FOLFOX/Avastin  PLAN: I have a lengthy discussion with the patient and his wife today about his condition. I encouraged him to increase his oral intake and eat more frequent small meals. We'll proceed with cycle #1 of his chemotherapy tomorrow as scheduled. He would come back for followup visit in 2 weeks with the start of cycle #2. He was advised to call immediately if he has any concerning symptoms in the interval. For anemia the patient will continue on Integra plus 1 capsule by mouth daily  All questions were answered. The patient knows to call the clinic with any problems, questions or concerns. We can certainly see the patient much sooner if necessary.  I spent 15 minutes counseling the patient face to face. The total time spent in the appointment  was 25 minutes.

## 2012-06-23 NOTE — Progress Notes (Signed)
INR = 1.4 after a slight dose increase on 12/23 then back to 2.5 mg/day; 5 mg MWF Pt had collard greens over New Year's.  This should've already affected INR as pt only had small amt. Pts wife reports pt is drinking less Boost recently.  I would expect this would cause INR to increase since Boost contains Vit K & his intake has decreased. Per pts wife, he is having an ECHO on 07/03/12 w/ Dr. Jacinto Halim.  They will decide then (or at follow up appt 07/10/12) whether pt should remain on Coumadin.  They state Dr. Jacinto Halim has told them it is ok for pt to stop Coumadin but pt has decided to stay on Coumadin until after the ECHO is done. I have instructed pt to increase Coumadin to 5 mg/day; 2.5 mg Wed/Fri. Repeat INR 1/14 w/ standing labs. Marily Lente, Pharm.D.

## 2012-06-24 ENCOUNTER — Ambulatory Visit: Payer: Medicare Other

## 2012-06-24 ENCOUNTER — Telehealth: Payer: Self-pay | Admitting: Internal Medicine

## 2012-06-24 ENCOUNTER — Ambulatory Visit (HOSPITAL_BASED_OUTPATIENT_CLINIC_OR_DEPARTMENT_OTHER): Payer: Medicare Other

## 2012-06-24 VITALS — BP 118/65 | HR 69 | Temp 97.9°F | Resp 20

## 2012-06-24 DIAGNOSIS — C189 Malignant neoplasm of colon, unspecified: Secondary | ICD-10-CM

## 2012-06-24 DIAGNOSIS — C787 Secondary malignant neoplasm of liver and intrahepatic bile duct: Secondary | ICD-10-CM

## 2012-06-24 DIAGNOSIS — C7800 Secondary malignant neoplasm of unspecified lung: Secondary | ICD-10-CM

## 2012-06-24 DIAGNOSIS — C180 Malignant neoplasm of cecum: Secondary | ICD-10-CM

## 2012-06-24 DIAGNOSIS — Z5112 Encounter for antineoplastic immunotherapy: Secondary | ICD-10-CM

## 2012-06-24 MED ORDER — SODIUM CHLORIDE 0.9 % IV SOLN
5.0000 mg/kg | Freq: Once | INTRAVENOUS | Status: AC
  Administered 2012-06-24: 300 mg via INTRAVENOUS
  Filled 2012-06-24: qty 12

## 2012-06-24 MED ORDER — BEVACIZUMAB CHEMO INJECTION 400 MG/16ML: 5.0000 mg/kg | Freq: Once | INTRAVENOUS | Status: DC

## 2012-06-24 MED ORDER — SODIUM CHLORIDE 0.9 % IV SOLN: Freq: Once | INTRAVENOUS | Status: DC

## 2012-06-24 MED ORDER — SODIUM CHLORIDE 0.9 % IV SOLN
2400.0000 mg/m2 | INTRAVENOUS | Status: DC
  Administered 2012-06-24: 4200 mg via INTRAVENOUS
  Filled 2012-06-24: qty 84

## 2012-06-24 NOTE — Patient Instructions (Addendum)
[facility] Discharge Instructions for Patients Receiving Chemotherapy  Today you received the following chemotherapy agents avastin/45fu  To help prevent nausea and vomiting after your treatment, we encourage you to take your nausea medication and take it as often as prescribed   If you develop nausea and vomiting that is not controlled by your nausea medication, call the clinic. If it is after clinic hours your family physician or the after hours number for the clinic or go to the Emergency Department.   BELOW ARE SYMPTOMS THAT SHOULD BE REPORTED IMMEDIATELY:  *FEVER GREATER THAN 100.5 F  *CHILLS WITH OR WITHOUT FEVER  NAUSEA AND VOMITING THAT IS NOT CONTROLLED WITH YOUR NAUSEA MEDICATION  *UNUSUAL SHORTNESS OF BREATH  *UNUSUAL BRUISING OR BLEEDING  TENDERNESS IN MOUTH AND THROAT WITH OR WITHOUT PRESENCE OF ULCERS  *URINARY PROBLEMS  *BOWEL PROBLEMS  UNUSUAL RASH Items with * indicate a potential emergency and should be followed up as soon as possible.  One of the nurses will contact you 24 hours after your treatment. Please let the nurse know about any problems that you may have experienced. Feel free to call the clinic you have any questions or concerns. The clinic phone number is [phone].   I have been informed and understand all the instructions given to me. I know to contact the clinic, my physician, or go to the Emergency Department if any problems should occur. I do not have any questions at this time, but understand that I may call the clinic during office hours   should I have any questions or need assistance in obtaining follow up care.    __________________________________________  _____________  __________ Signature of Patient or Authorized Representative            Date                   Time    __________________________________________ Nurse's Signature

## 2012-06-24 NOTE — Telephone Encounter (Signed)
Gave pt appt for January lab, chemo and ML. Pt wanted all appts on same day. Lab and ML moved to 1/22 and called Marcelino Duster to request to move chemo to 07/10/11

## 2012-06-25 ENCOUNTER — Other Ambulatory Visit: Payer: Medicare Other | Admitting: Lab

## 2012-06-25 ENCOUNTER — Ambulatory Visit: Payer: Medicare Other | Admitting: Internal Medicine

## 2012-06-26 ENCOUNTER — Ambulatory Visit (HOSPITAL_BASED_OUTPATIENT_CLINIC_OR_DEPARTMENT_OTHER): Payer: Medicare Other

## 2012-06-26 ENCOUNTER — Ambulatory Visit: Payer: Medicare Other

## 2012-06-26 ENCOUNTER — Telehealth: Payer: Self-pay | Admitting: Internal Medicine

## 2012-06-26 VITALS — BP 115/57 | HR 67 | Temp 98.4°F

## 2012-06-26 DIAGNOSIS — C180 Malignant neoplasm of cecum: Secondary | ICD-10-CM

## 2012-06-26 DIAGNOSIS — C189 Malignant neoplasm of colon, unspecified: Secondary | ICD-10-CM

## 2012-06-26 DIAGNOSIS — C787 Secondary malignant neoplasm of liver and intrahepatic bile duct: Secondary | ICD-10-CM

## 2012-06-26 DIAGNOSIS — C7800 Secondary malignant neoplasm of unspecified lung: Secondary | ICD-10-CM

## 2012-06-26 MED ORDER — HEPARIN SOD (PORK) LOCK FLUSH 100 UNIT/ML IV SOLN
500.0000 [IU] | Freq: Once | INTRAVENOUS | Status: AC | PRN
  Administered 2012-06-26: 500 [IU]
  Filled 2012-06-26: qty 5

## 2012-06-26 MED ORDER — SODIUM CHLORIDE 0.9 % IJ SOLN
10.0000 mL | INTRAMUSCULAR | Status: DC | PRN
  Administered 2012-06-26: 10 mL
  Filled 2012-06-26: qty 10

## 2012-06-26 NOTE — Patient Instructions (Signed)
Call MD for problems 

## 2012-06-26 NOTE — Telephone Encounter (Signed)
Talked to patient, and she does have all the appts for January 2014 , lab,ML and chemo

## 2012-06-27 ENCOUNTER — Ambulatory Visit
Admission: RE | Admit: 2012-06-27 | Discharge: 2012-06-27 | Disposition: A | Discharge status: Home / Self Care | Payer: Medicare Other | Source: Ambulatory Visit | Attending: Radiation Oncology | Admitting: Radiation Oncology

## 2012-06-27 DIAGNOSIS — C7931 Secondary malignant neoplasm of brain: Secondary | ICD-10-CM

## 2012-06-27 MED ORDER — GADOBENATE DIMEGLUMINE 529 MG/ML IV SOLN
13.0000 mL | Freq: Once | INTRAVENOUS | Status: AC | PRN
  Administered 2012-06-27: 13 mL via INTRAVENOUS

## 2012-06-30 ENCOUNTER — Ambulatory Visit (HOSPITAL_BASED_OUTPATIENT_CLINIC_OR_DEPARTMENT_OTHER): Payer: Medicare Other | Admitting: Pharmacist

## 2012-06-30 ENCOUNTER — Ambulatory Visit
Admission: RE | Admit: 2012-06-30 | Discharge: 2012-06-30 | Disposition: A | Discharge status: Home / Self Care | Payer: Medicare Other | Source: Ambulatory Visit | Attending: Radiation Oncology | Admitting: Radiation Oncology

## 2012-06-30 ENCOUNTER — Ambulatory Visit: Payer: Medicare Other | Admitting: Radiation Oncology

## 2012-06-30 ENCOUNTER — Other Ambulatory Visit (HOSPITAL_BASED_OUTPATIENT_CLINIC_OR_DEPARTMENT_OTHER): Payer: Medicare Other | Admitting: Lab

## 2012-06-30 VITALS — BP 151/93 | HR 84 | Temp 97.9°F | Resp 20 | Wt 135.9 lb

## 2012-06-30 DIAGNOSIS — C7931 Secondary malignant neoplasm of brain: Secondary | ICD-10-CM

## 2012-06-30 DIAGNOSIS — I513 Intracardiac thrombosis, not elsewhere classified: Secondary | ICD-10-CM

## 2012-06-30 DIAGNOSIS — I219 Acute myocardial infarction, unspecified: Secondary | ICD-10-CM

## 2012-06-30 DIAGNOSIS — C180 Malignant neoplasm of cecum: Secondary | ICD-10-CM

## 2012-06-30 DIAGNOSIS — C189 Malignant neoplasm of colon, unspecified: Secondary | ICD-10-CM

## 2012-06-30 LAB — CBC WITH DIFFERENTIAL/PLATELET
BASO%: 0.5 % (ref 0.0–2.0)
LYMPH%: 10.8 % — ABNORMAL LOW (ref 14.0–49.0)
MCHC: 34.3 g/dL (ref 32.0–36.0)
MONO#: 0.5 10*3/uL (ref 0.1–0.9)
MONO%: 6.7 % (ref 0.0–14.0)
Platelets: 207 10*3/uL (ref 140–400)
RBC: 3.4 10*6/uL — ABNORMAL LOW (ref 4.20–5.82)
WBC: 7.1 10*3/uL (ref 4.0–10.3)

## 2012-06-30 LAB — COMPREHENSIVE METABOLIC PANEL (CC13)
Alkaline Phosphatase: 314 U/L — ABNORMAL HIGH (ref 40–150)
BUN: 21 mg/dL (ref 7.0–26.0)
Glucose: 133 mg/dl — ABNORMAL HIGH (ref 70–99)
Sodium: 136 mEq/L (ref 136–145)
Total Bilirubin: 0.45 mg/dL (ref 0.20–1.20)

## 2012-06-30 LAB — PROTIME-INR: Protime: 16.8 Seconds — ABNORMAL HIGH (ref 10.6–13.4)

## 2012-06-30 NOTE — Patient Instructions (Signed)
Take 7.5mg  today.  On 07/01/12, increase coumadin to 5mg  daily.  Recheck INR in 1 week on 07/09/12; lab at 8:45am, apt with Adrena at 9:15am, treatment at 10:15am, coumadin clinic at 10:30am and nutritionist appt at 11:15am.

## 2012-06-30 NOTE — Progress Notes (Signed)
INR unchanged from previous week(s) despite dose increases. Will further increase coumadin today. Take 7.5mg  today. On 07/01/12, increase coumadin to 5mg  daily.  Recheck INR in 1 week on 07/09/12; lab at 8:45am, apt with Adrena at 9:15am, treatment at 10:15am, coumadin clinic at 10:30am and nutritionist appt at 11:15am. Pt is still scheduled for ECHO on 07/03/12 and f/u MD appt with Dr. Jacinto Halim on 07/10/12 to discuss stopping coumadin.

## 2012-06-30 NOTE — Progress Notes (Addendum)
Radiation Oncology         [phone] ________________________________  Name: Joseph Rowland MRN: [id number]  Date: 06/30/2012  DOB: 11-Nov-1933  Follow-Up Visit Note  CC: Joseph Pierini, FNP  Joseph Rowland, Mary-Margaret, *  Diagnosis:   Metastatic colon cancer  Interval Since Last Radiation:  6 months   Narrative:  The patient returns today for routine follow-up.  The patient returns to clinic today for routine followup for his metastatic colon cancer. The patient completed stereotactic radiosurgery in June of 2013. The patient denies any significant headaches, nausea, vision changes. He does complain of some generalized fatigue but no focal weakness.  The patient underwent a recent MRI scan of the brain and he presents today for discussion of this. He has been continuing with palliative chemotherapy through Dr. Arbutus Ped.                              ALLERGIES:  is allergic to dronabinol.  Meds: Current Outpatient Prescriptions  Medication Sig Dispense Refill  . Alum & Mag Hydroxide-Simeth (MAGIC MOUTHWASH) SOLN Swish and spit 5 mLs 4 (four) times daily.      Marland Kitchen aspirin EC 81 MG tablet Take 81 mg by mouth daily.      . carvedilol (COREG) 3.125 MG tablet       . cholecalciferol (VITAMIN D) 1000 UNITS tablet Take 1,000 Units by mouth daily.      . digoxin (LANOXIN) 0.125 MG tablet       . FeFum-FePoly-FA-B Cmp-C-Biot (INTEGRA PLUS) CAPS Take 1 capsule by mouth every morning.  30 capsule  2  . HYDROcodone-acetaminophen (LORTAB) 7.5-500 MG/15ML solution Take 7.5 mg by mouth Every 6 hours as needed.      . lidocaine-prilocaine (EMLA) cream Apply 1 application topically as needed. For port-a-cath access.      Marland Kitchen lisinopril (PRINIVIL,ZESTRIL) 5 MG tablet       . potassium chloride (K-DUR,KLOR-CON) 10 MEQ tablet       . PRESCRIPTION MEDICATION Inject into the vein every 14 (fourteen) days. Avastin, Leucovorin, Adrucil, Eloxatin      . vitamin B-12 (CYANOCOBALAMIN) 250 MCG tablet Take 250  mcg by mouth daily.      Marland Kitchen warfarin (COUMADIN) 5 MG tablet Take 5mg  PO daily except 2.5mg  on Sunday and Thursday, or as directed.  30 tablet  3    Physical Findings: The patient is in no acute distress. Patient is alert and oriented.  weight is 135 lb 14.4 oz (61.644 kg). His temperature is 97.9 F (36.6 C). His blood pressure is 151/93 and his pulse is 84. His respiration is 20 and oxygen saturation is 99%. .   General: Well-developed, in no acute distress HEENT: Normocephalic, atraumatic Cardiovascular: Regular rate and rhythm Respiratory: Clear to auscultation bilaterally GI: Soft, nontender, normal bowel sounds Extremities: No edema present   Lab Findings: Lab Results  Component Value Date   WBC 7.1 06/30/2012   HGB 12.3* 06/30/2012   HCT 35.8* 06/30/2012   MCV 105.3* 06/30/2012   PLT 207 06/30/2012     Radiographic Findings: Ct Chest W Contrast  06/03/2012  *RADIOLOGY REPORT*  Clinical Data:  Metastatic colon carcinoma diagnosed 3/13 with chemotherapy ongoing.  CT CHEST, ABDOMEN AND PELVIS WITH CONTRAST  Technique: Contiguous axial images of the chest abdomen and pelvis were obtained after IV contrast administration.  Contrast: 100  ml Omnipaque-300  Comparison: 02/28/2012  CT CHEST  Findings: Lung windows demonstrate probable secretions  along the right side trachea.  Bilateral pulmonary metastasis as follows: - Right lower lobe nodule which measures 2.5 x 2.3 cm on image 48/series 5 compared with 2.7 x 2.4 cm on the prior. - More medial right lower lobe 1.0 cm nodule on image 44/series 5 versus 1.1 cm on the prior. - 1.4 cm left upper lobe nodule on image 30/series 5 versus 1.3 cm on the prior. - Pleural-based left lower lobe nodule which measures 2.2 x 1.4 cm on image 37/series 5 versus 2.1 x 1.4 cm on the prior exam. - 5 mm left lower lobe nodule on image 36/series 5 is similar to slightly less conspicuous than on the prior.  Other scattered nodules which are felt to be similar.   Soft tissue windows demonstrate no supraclavicular adenopathy. Right-sided Port-A-Cath which terminates in the right atrium on supine positioning.  Moderate atherosclerotic narrowing of the proximal left subclavian artery on image 13/series 2.  Mild cardiomegaly.  Multivessel coronary artery atherosclerosis. The possible left apical thrombus described on the prior exam is not readily apparent today.  There is mild lipomatous hypertrophy of the interatrial septum.  No pericardial effusion.  Resolved small right pleural effusion. No central pulmonary embolism, on this non-dedicated study.  High right paratracheal node measures 6 mm today versus 9 mm on the prior. No mediastinal or hilar adenopathy.  IMPRESSION:  1.  Similar pulmonary metastatic burden. 2.  No new or progressive disease within the chest. 3.  Resolution of small right pleural effusion.  CT ABDOMEN AND PELVIS  Findings:  Extensive hepatic metastasis. -  Index right hepatic lobe lesion measures 4.5 x 3.4 cm on image 73/series 2 compare 4.6 x 3.5 cm on the prior. - Index high medial segment left liver lobe lesion measures 2.3 x 3.5 cm on image 56/series 2 versus 3.6 x 2.3 cm on the prior.  No enlarging lesions.  Normal spleen, stomach, pancreas.  There is probable gallbladder sludge or stones, including on image 77/series 2.  The gallbladder mucosa is mildly hyperenhancing; new finding. There is minimal pericholecystic fluid, nonspecific in the setting of small volume ascites.  No biliary ductal dilatation.  Normal adrenal glands.  Normal right kidney.  Scattered left renal cysts.  An aortocaval node measures 8 mm on image 73/series 2 and is unchanged.  Not pathologic by size criteria.  Colonic stool burden suggests constipation.  Apparent ascending colonic wall thickening which is favored to be due to underdistension.  Cecal primary again seen on image 102/series 2.  No obstruction.  Normal small bowel caliber.  Redemonstration of omental/peritoneal  disease.  Example within the perihepatic space on image 77/series 2.  Adenopathy in the ileocolic mesentery at 1.2 cm on image 98/series 2.  Similar when remeasured. No pneumatosis or free intraperitoneal air.  No pelvic sidewall adenopathy.  Normal urinary bladder and prostate.  Moderate volume cul-de-sac fluid which is increased. Lytic lesion within the L5 vertebral body which appears similar. Mild superior endplate compression deformity.  IMPRESSION:  1.  Similar hepatic metastasis. 2.  Omental/peritoneal metastasis with increased abdominal pelvic ascites. 3.  Cecal primary with adjacent localized nodal metastasis.  No obstruction. 4.  Gallstones or sludge with pericholecystic fluid and mucosal hyperenhancement.  Nonspecific in the setting of ascites.  If there are right upper quadrant symptoms, recommend ultrasound. 5.  L5 osseous metastasis with mild pathologic compression deformity, grossly similar.   Original Report Authenticated By: Jeronimo Greaves, M.D.    Mr Laqueta Jean Wo Contrast  06/27/2012  *  RADIOLOGY REPORT*  Clinical Data: 39-month restaging stereotactic radiation surgery. Metastatic adenocarcinoma of the colon.  MRI HEAD WITHOUT AND WITH CONTRAST  Technique:  Multiplanar, multiecho pulse sequences of the brain and surrounding structures were obtained according to standard protocol without and with intravenous contrast  Contrast: 13mL MULTIHANCE GADOBENATE DIMEGLUMINE 529 MG/ML IV SOLN  Comparison: Three prior exams most recent 03/21/2012.  Findings: Tiny area of enhancement right occipital lobe unchanged.  Interval clearing of areas of enhancement involving the medial aspect of the right parietal lobe.  No new intracranial lesion is noted.  Slight decrease in thickness of the right palatine tonsil lesion.  Dural thickening unchanged.  Interval development of right hemispheric loculated subdural collection with maximal thickness of 6.7 mm with slight flattening of the adjacent brain.  No acute infarct.  No  intracranial hemorrhage.  Mild small vessel disease type changes.  Major intracranial vascular structures are patent.  Global atrophy without hydrocephalus. Prominent cerebrospinal fluid spaces posterior fossa greater on the left unchanged.  Paranasal sinus mucosal thickening/partial opacification most notable ethmoid sinus air cells.  Transverse ligament hypertrophy.  IMPRESSION: Tiny area of enhancement right occipital lobe unchanged.  Interval clearing of areas of enhancement involving the medial aspect of the right parietal lobe.  No new intracranial lesion is noted.  Slight decrease in thickness of the right palatine tonsil lesion.  Dural thickening unchanged.  Interval development of right hemispheric loculated subdural collection with maximal thickness of 6.7 mm with slight flattening of the adjacent brain. This may represent a subdural hygroma (secondary consideration is a chronic subdural hematoma).  This has been made a PRA call report utilizing dashboard call feature.   Original Report Authenticated By: Lacy Duverney, M.D.    Ct Abdomen Pelvis W Contrast  06/03/2012  *RADIOLOGY REPORT*  Clinical Data:  Metastatic colon carcinoma diagnosed 3/13 with chemotherapy ongoing.  CT CHEST, ABDOMEN AND PELVIS WITH CONTRAST  Technique: Contiguous axial images of the chest abdomen and pelvis were obtained after IV contrast administration.  Contrast: 100  ml Omnipaque-300  Comparison: 02/28/2012  CT CHEST  Findings: Lung windows demonstrate probable secretions along the right side trachea.  Bilateral pulmonary metastasis as follows: - Right lower lobe nodule which measures 2.5 x 2.3 cm on image 48/series 5 compared with 2.7 x 2.4 cm on the prior. - More medial right lower lobe 1.0 cm nodule on image 44/series 5 versus 1.1 cm on the prior. - 1.4 cm left upper lobe nodule on image 30/series 5 versus 1.3 cm on the prior. - Pleural-based left lower lobe nodule which measures 2.2 x 1.4 cm on image 37/series 5 versus 2.1  x 1.4 cm on the prior exam. - 5 mm left lower lobe nodule on image 36/series 5 is similar to slightly less conspicuous than on the prior.  Other scattered nodules which are felt to be similar.  Soft tissue windows demonstrate no supraclavicular adenopathy. Right-sided Port-A-Cath which terminates in the right atrium on supine positioning.  Moderate atherosclerotic narrowing of the proximal left subclavian artery on image 13/series 2.  Mild cardiomegaly.  Multivessel coronary artery atherosclerosis. The possible left apical thrombus described on the prior exam is not readily apparent today.  There is mild lipomatous hypertrophy of the interatrial septum.  No pericardial effusion.  Resolved small right pleural effusion. No central pulmonary embolism, on this non-dedicated study.  High right paratracheal node measures 6 mm today versus 9 mm on the prior. No mediastinal or hilar adenopathy.  IMPRESSION:  1.  Similar pulmonary metastatic burden. 2.  No new or progressive disease within the chest. 3.  Resolution of small right pleural effusion.  CT ABDOMEN AND PELVIS  Findings:  Extensive hepatic metastasis. -  Index right hepatic lobe lesion measures 4.5 x 3.4 cm on image 73/series 2 compare 4.6 x 3.5 cm on the prior. - Index high medial segment left liver lobe lesion measures 2.3 x 3.5 cm on image 56/series 2 versus 3.6 x 2.3 cm on the prior.  No enlarging lesions.  Normal spleen, stomach, pancreas.  There is probable gallbladder sludge or stones, including on image 77/series 2.  The gallbladder mucosa is mildly hyperenhancing; new finding. There is minimal pericholecystic fluid, nonspecific in the setting of small volume ascites.  No biliary ductal dilatation.  Normal adrenal glands.  Normal right kidney.  Scattered left renal cysts.  An aortocaval node measures 8 mm on image 73/series 2 and is unchanged.  Not pathologic by size criteria.  Colonic stool burden suggests constipation.  Apparent ascending colonic wall  thickening which is favored to be due to underdistension.  Cecal primary again seen on image 102/series 2.  No obstruction.  Normal small bowel caliber.  Redemonstration of omental/peritoneal disease.  Example within the perihepatic space on image 77/series 2.  Adenopathy in the ileocolic mesentery at 1.2 cm on image 98/series 2.  Similar when remeasured. No pneumatosis or free intraperitoneal air.  No pelvic sidewall adenopathy.  Normal urinary bladder and prostate.  Moderate volume cul-de-sac fluid which is increased. Lytic lesion within the L5 vertebral body which appears similar. Mild superior endplate compression deformity.  IMPRESSION:  1.  Similar hepatic metastasis. 2.  Omental/peritoneal metastasis with increased abdominal pelvic ascites. 3.  Cecal primary with adjacent localized nodal metastasis.  No obstruction. 4.  Gallstones or sludge with pericholecystic fluid and mucosal hyperenhancement.  Nonspecific in the setting of ascites.  If there are right upper quadrant symptoms, recommend ultrasound. 5.  L5 osseous metastasis with mild pathologic compression deformity, grossly similar.   Original Report Authenticated By: Jeronimo Greaves, M.D.     Impression:    The patient's MRI scan looked good in terms of no recurrent disease/progressive disease. This was discussed in detail at brain conference this morning.  One issue however was an interval development of a subdural collection. This was discussed in terms of the patient potentially having a chronic subdural hematoma and the patient is on Coumadin. Neurosurgery (Dr. Venetia Maxon) recommended at conference that the patient discontinue Coumadin if possible until this area resolves. I discussed this issue in detail with the patient and his wife today. The patient is due for an echocardiogram regarding his ongoing need for Coumadin later this week and he is to discuss this further with Dr. Jacinto Halim.  Plan:  The patient will continue with ongoing followup as  scheduled by our [facility] program, and I will stagger her followup visits with neurosurgery.  I spent 15 minutes with the patient today, the majority of which was spent counseling the patient on the diagnosis of cancer and coordinating care.   Radene Gunning, M.D., Ph.D.

## 2012-06-30 NOTE — Progress Notes (Signed)
Patient and family  here for follow up metastatic colon cancer to brain, lung, liver and bone.Post completion of SRS right and left occipital lesions on  12/07/2011.Marland KitchenDenies pain,nausea/vomiting or any other side effects/deficits.Had blood drawn this morning per coumadin clinic.Generlized fatigue.

## 2012-07-01 ENCOUNTER — Telehealth: Payer: Self-pay | Admitting: Pharmacist

## 2012-07-01 ENCOUNTER — Other Ambulatory Visit: Payer: Medicare Other | Admitting: Lab

## 2012-07-01 ENCOUNTER — Ambulatory Visit: Payer: Medicare Other

## 2012-07-01 NOTE — Telephone Encounter (Signed)
Spoke with Dr. Arbutus Ped. He would favor pt stopping Coumadin today as long as ok with Dr. Jacinto Halim. He reviewed recent MR of brain which showed a subdural hygroma (secondary consideration is a chronic subdural hematoma). Previous Anticoag note on 06/23/12 documented Dr. Jacinto Halim is ok with pt stopping Coumadin prior to 07/03/12 ECHO and 07/10/12 Cardiologist F/U visit. It was pt preference to continue coumadin until 07/10/12. Pt and wife confirmed Dr. Jacinto Halim was ok with them stopping the Coumadin before 07/10/12. They will discontinue coumadin today.

## 2012-07-02 ENCOUNTER — Ambulatory Visit: Payer: Medicare Other | Admitting: Radiation Oncology

## 2012-07-02 ENCOUNTER — Other Ambulatory Visit: Payer: Self-pay | Admitting: Radiation Therapy

## 2012-07-02 DIAGNOSIS — C7949 Secondary malignant neoplasm of other parts of nervous system: Secondary | ICD-10-CM

## 2012-07-03 ENCOUNTER — Other Ambulatory Visit: Payer: Medicare Other | Admitting: Lab

## 2012-07-08 ENCOUNTER — Telehealth: Payer: Self-pay | Admitting: *Deleted

## 2012-07-08 ENCOUNTER — Other Ambulatory Visit: Payer: Medicare Other | Admitting: Lab

## 2012-07-08 ENCOUNTER — Ambulatory Visit: Payer: Medicare Other

## 2012-07-08 NOTE — Telephone Encounter (Signed)
Per patient request I have moved treatment appt to follow appts on 1/24.  JMW

## 2012-07-09 ENCOUNTER — Ambulatory Visit: Payer: Medicare Other

## 2012-07-09 ENCOUNTER — Other Ambulatory Visit: Payer: Medicare Other | Admitting: Lab

## 2012-07-09 ENCOUNTER — Ambulatory Visit: Payer: Medicare Other | Admitting: Physician Assistant

## 2012-07-09 ENCOUNTER — Encounter: Payer: Medicare Other | Admitting: Nutrition

## 2012-07-10 ENCOUNTER — Ambulatory Visit: Payer: Medicare Other

## 2012-07-10 ENCOUNTER — Other Ambulatory Visit (HOSPITAL_BASED_OUTPATIENT_CLINIC_OR_DEPARTMENT_OTHER): Payer: Medicare Other | Admitting: Lab

## 2012-07-10 ENCOUNTER — Encounter: Payer: Self-pay | Admitting: Physician Assistant

## 2012-07-10 ENCOUNTER — Ambulatory Visit (HOSPITAL_BASED_OUTPATIENT_CLINIC_OR_DEPARTMENT_OTHER): Payer: Medicare Other | Admitting: Physician Assistant

## 2012-07-10 ENCOUNTER — Telehealth: Payer: Self-pay | Admitting: Internal Medicine

## 2012-07-10 ENCOUNTER — Other Ambulatory Visit: Payer: Self-pay | Admitting: Medical Oncology

## 2012-07-10 ENCOUNTER — Other Ambulatory Visit: Payer: Self-pay | Admitting: Physician Assistant

## 2012-07-10 ENCOUNTER — Ambulatory Visit (HOSPITAL_BASED_OUTPATIENT_CLINIC_OR_DEPARTMENT_OTHER): Payer: Medicare Other

## 2012-07-10 ENCOUNTER — Other Ambulatory Visit: Payer: Self-pay | Admitting: *Deleted

## 2012-07-10 ENCOUNTER — Other Ambulatory Visit: Payer: Medicare Other | Admitting: Lab

## 2012-07-10 ENCOUNTER — Encounter: Payer: Medicare Other | Admitting: Nutrition

## 2012-07-10 VITALS — BP 142/74 | HR 49 | Temp 96.8°F | Resp 20 | Ht 71.0 in | Wt 131.1 lb

## 2012-07-10 VITALS — BP 124/54 | HR 55

## 2012-07-10 DIAGNOSIS — C189 Malignant neoplasm of colon, unspecified: Secondary | ICD-10-CM

## 2012-07-10 DIAGNOSIS — C7800 Secondary malignant neoplasm of unspecified lung: Secondary | ICD-10-CM

## 2012-07-10 DIAGNOSIS — C7951 Secondary malignant neoplasm of bone: Secondary | ICD-10-CM

## 2012-07-10 DIAGNOSIS — Z5111 Encounter for antineoplastic chemotherapy: Secondary | ICD-10-CM

## 2012-07-10 DIAGNOSIS — C787 Secondary malignant neoplasm of liver and intrahepatic bile duct: Secondary | ICD-10-CM

## 2012-07-10 DIAGNOSIS — C180 Malignant neoplasm of cecum: Secondary | ICD-10-CM

## 2012-07-10 DIAGNOSIS — Z5112 Encounter for antineoplastic immunotherapy: Secondary | ICD-10-CM

## 2012-07-10 LAB — COMPREHENSIVE METABOLIC PANEL (CC13)
ALT: 42 U/L (ref 0–55)
Albumin: 2.1 g/dL — ABNORMAL LOW (ref 3.5–5.0)
BUN: 19 mg/dL (ref 7.0–26.0)
CO2: 28 mEq/L (ref 22–29)
Calcium: 9.6 mg/dL (ref 8.4–10.4)
Chloride: 102 mEq/L (ref 98–107)
Creatinine: 0.6 mg/dL — ABNORMAL LOW (ref 0.7–1.3)

## 2012-07-10 LAB — CBC WITH DIFFERENTIAL/PLATELET
Eosinophils Absolute: 0.1 10*3/uL (ref 0.0–0.5)
HCT: 39.6 % (ref 38.4–49.9)
HGB: 13 g/dL (ref 13.0–17.1)
LYMPH%: 11.8 % — ABNORMAL LOW (ref 14.0–49.0)
MONO#: 1.2 10*3/uL — ABNORMAL HIGH (ref 0.1–0.9)
NEUT#: 6.2 10*3/uL (ref 1.5–6.5)
NEUT%: 72.2 % (ref 39.0–75.0)
Platelets: 219 10*3/uL (ref 140–400)
WBC: 8.6 10*3/uL (ref 4.0–10.3)
nRBC: 0 % (ref 0–0)

## 2012-07-10 LAB — UA PROTEIN, DIPSTICK - CHCC: Protein, ur: NEGATIVE mg/dL

## 2012-07-10 MED ORDER — SODIUM CHLORIDE 0.9 % IV SOLN: Freq: Once | INTRAVENOUS | Status: DC

## 2012-07-10 MED ORDER — SODIUM CHLORIDE 0.9 % IV SOLN
2400.0000 mg/m2 | INTRAVENOUS | Status: DC
  Administered 2012-07-10: 4200 mg via INTRAVENOUS
  Filled 2012-07-10: qty 84

## 2012-07-10 MED ORDER — SODIUM CHLORIDE 0.9 % IV SOLN
5.0000 mg/kg | Freq: Once | INTRAVENOUS | Status: AC
  Administered 2012-07-10: 300 mg via INTRAVENOUS
  Filled 2012-07-10: qty 12

## 2012-07-10 NOTE — Patient Instructions (Addendum)
Follow up in 2 weeks prior to your next cycle of chemotherapy

## 2012-07-10 NOTE — Telephone Encounter (Signed)
Pt came by and needed to move her chemo dates due to work schedule...emialed michelle..the patient will come back to get schedule.

## 2012-07-10 NOTE — Patient Instructions (Signed)
[facility] Discharge Instructions for Patients Receiving Chemotherapy  Today you received the following chemotherapy agents Avastin/ 63fu  To help prevent nausea and vomiting after your treatment, we encourage you to take your nausea medication as per Dr. Arbutus Ped  If you develop nausea and vomiting that is not controlled by your nausea medication, call the clinic. If it is after clinic hours your family physician or the after hours number for the clinic or go to the Emergency Department.   BELOW ARE SYMPTOMS THAT SHOULD BE REPORTED IMMEDIATELY:  *FEVER GREATER THAN 100.5 F  *CHILLS WITH OR WITHOUT FEVER  NAUSEA AND VOMITING THAT IS NOT CONTROLLED WITH YOUR NAUSEA MEDICATION  *UNUSUAL SHORTNESS OF BREATH  *UNUSUAL BRUISING OR BLEEDING  TENDERNESS IN MOUTH AND THROAT WITH OR WITHOUT PRESENCE OF ULCERS  *URINARY PROBLEMS  *BOWEL PROBLEMS  UNUSUAL RASH Items with * indicate a potential emergency and should be followed up as soon as possible.  Feel free to call the clinic you have any questions or concerns. The clinic phone number is [phone].   I have been informed and understand all the instructions given to me. I know to contact the clinic, my physician, or go to the Emergency Department if any problems should occur. I do not have any questions at this time, but understand that I may call the clinic during office hours   should I have any questions or need assistance in obtaining follow up care.    __________________________________________  _____________  __________ Signature of Patient or Authorized Representative            Date                   Time    __________________________________________ Nurse's Signature

## 2012-07-11 NOTE — Progress Notes (Signed)
[facility] [phone]   Fax:([phone]  OFFICE PROGRESS NOTE  [name], FNP [address] [place] [address]  DIAGNOSIS:  1) Metastatic colon adenocarcinoma (KRAS mutation) with metastatic lesion to the brain, lung, liver and bone diagnosed in May of 2013.  2) left ventricular mural thrombus incidentally diagnosed on recent CT scan of the chest on 02/28/2012.   PRIOR THERAPY:  1) Status post stereotactic radiosurgery to 2 brain lesions.  2) Systemic chemotherapy with FOLFOX/Avastin status post 11 cycles.   CURRENT THERAPY:  1) continuous infusion 5-FU and leucovorin with Avastin every 2 weeks. First cycle on 06/24/2012. 2) Coumadin 5 mg by mouth daily for the recently diagnosed left ventricular mural thrombus.  INTERVAL HISTORY: Joseph Rowland 79 y.o. male returns to the clinic today for followup visit accompanied by his wife. The patient is still complaining of increasing fatigue and weakness. Patient reports that he is eating a little bit better but continues to lose weight. He fell on Monday while checking their oil drum and sustained a skin tear on his left forearm. The patient is status post 1 cycle of systemic chemotherapy with continuous infusion 5-FU on leucovorin tomorrow. He denied having any significant nausea or vomiting. He denied having any significant chest pain, shortness breath, cough or hemoptysis.  MEDICAL HISTORY: Past Medical History  Diagnosis Date  . Lung mass   . Liver cancer   . metastatic adenocarcinoma 11/14/11    liver bx=metastatic adenocarcino9ma with tumor necrosis kras  mutation and involving brain,lung,and bones  . Brain cancer     lesions in occipital lobes,b/l suspicious mets  . Lung cancer     b/l multifocal pulmonary mets  . Hypertension   . Anemia   . History of radiation therapy 12/07/11    SRS single fraction palliative 2 intracranial lesions  . History of chemotherapy 01/07/12   FOLFAX/AVASTIN s/p 3 cycles   . Mitral regurgitation   . Dilated cardiomyopathy   . Shortness of breath     on exertion  . Chemotherapy induced neutropenia 05/20/2012    ALLERGIES:  is allergic to dronabinol.  MEDICATIONS:  Current Outpatient Prescriptions  Medication Sig Dispense Refill  . Alum & Mag Hydroxide-Simeth (MAGIC MOUTHWASH) SOLN Swish and spit 5 mLs 4 (four) times daily.      Marland Kitchen aspirin EC 81 MG tablet Take 81 mg by mouth daily.      . carvedilol (COREG) 3.125 MG tablet       . cholecalciferol (VITAMIN D) 1000 UNITS tablet Take 1,000 Units by mouth daily.      . digoxin (LANOXIN) 0.125 MG tablet       . FeFum-FePoly-FA-B Cmp-C-Biot (INTEGRA PLUS) CAPS Take 1 capsule by mouth every morning.  30 capsule  2  . HYDROcodone-acetaminophen (LORTAB) 7.5-500 MG/15ML solution Take 7.5 mg by mouth Every 6 hours as needed.      . lidocaine-prilocaine (EMLA) cream Apply 1 application topically as needed. For port-a-cath access.      Marland Kitchen lisinopril (PRINIVIL,ZESTRIL) 5 MG tablet       . PRESCRIPTION MEDICATION Inject into the vein every 14 (fourteen) days. Avastin, Leucovorin, Adrucil, Eloxatin      . vitamin B-12 (CYANOCOBALAMIN) 250 MCG tablet Take 250 mcg by mouth daily.        SURGICAL HISTORY:  Past Surgical History  Procedure Date  . Hernia repair   . Right port a cath     power port right subclavian  .  Colonoscopy 12/06/2011    Procedure: COLONOSCOPY;  Surgeon: Louis Meckel, MD;  Location: [facility];  Service: Endoscopy;  Laterality: N/A;  . Colonoscopy 12/06/2011    Procedure: COLONOSCOPY;  Surgeon: Louis Meckel, MD;  Location: [facility];  Service: Endoscopy;  Laterality: N/A;    REVIEW OF SYSTEMS:  A comprehensive review of systems was negative except for: Constitutional: positive for anorexia, fatigue, weight loss and Lack of appetite   PHYSICAL EXAMINATION: General appearance: alert, cooperative and no distress Head: Normocephalic, without obvious abnormality,  atraumatic Neck: no adenopathy Lymph nodes: Cervical, supraclavicular, and axillary nodes normal. Resp: clear to auscultation bilaterally Cardio: regular rate and rhythm, S1, S2 normal, no murmur, click, rub or gallop GI: soft, non-tender; bowel sounds normal; no masses,  no organomegaly Extremities: extremities normal, atraumatic, no cyanosis or edema Neurologic: Alert and oriented X 3, normal strength and tone. Normal symmetric reflexes. Normal coordination and gait Skin: Left forearm reveals an approximately 3 cm ovoid skin care 85% replaced with a small amount of red oozing blood. No exudate present, wound clean.  ECOG PERFORMANCE STATUS: 2 - Symptomatic, <50% confined to bed  Blood pressure 142/74, pulse 49, temperature 96.8 F (36 C), temperature source Oral, resp. rate 20, height 5\' 11"  (1.803 m), weight 131 lb 1.6 oz (59.467 kg).  LABORATORY DATA: Lab Results  Component Value Date   WBC 8.6 07/10/2012   HGB 13.0 07/10/2012   HCT 39.6 07/10/2012   MCV 103.7* 07/10/2012   PLT 219 07/10/2012      Chemistry      Component Value Date/Time   NA 136 07/10/2012 1127   NA 134* 02/28/2012 1638   K 4.3 07/10/2012 1127   K 3.9 02/28/2012 1638   CL 102 07/10/2012 1127   CL 100 02/28/2012 1638   CO2 28 07/10/2012 1127   CO2 27 02/28/2012 1638   BUN 19.0 07/10/2012 1127   BUN 11 02/28/2012 1638   CREATININE 0.6* 07/10/2012 1127   CREATININE 0.90 02/28/2012 1638      Component Value Date/Time   CALCIUM 9.6 07/10/2012 1127   CALCIUM 9.2 02/28/2012 1638   ALKPHOS 311* 07/10/2012 1127   ALKPHOS 153* 02/04/2012 1034   AST 78* 07/10/2012 1127   AST 36 02/04/2012 1034   ALT 42 07/10/2012 1127   ALT 22 02/04/2012 1034   BILITOT 0.63 07/10/2012 1127   BILITOT 0.3 02/04/2012 1034       ASSESSMENT/PLAN: This is a very pleasant 79 years old white male with metastatic colon adenocarcinoma status post 11 cycles of systemic chemotherapy with FOLFOX/Avastin he is now being treated with continuous infusion 5-FU  plus Avastin. The leucovorin aspect of his treatment plan needs to be clarified. Patient was discussed with Dr. Truett Perna in Dr. Asa Lente absence. The left forearm skin tear was dressed with a Telfa pad and gauze. The patient will return in 2 weeks with repeat CBC differential and C. met prior to his next cycle of chemotherapy.  Tannia Contino E, PA-C   For anemia the patient will continue on Integra plus 1 capsule by mouth daily  All questions were answered. The patient knows to call the clinic with any problems, questions or concerns. We can certainly see the patient much sooner if necessary.  I spent 20 minutes counseling the patient face to face. The total time spent in the appointment was 30 minutes.

## 2012-07-12 ENCOUNTER — Ambulatory Visit (HOSPITAL_BASED_OUTPATIENT_CLINIC_OR_DEPARTMENT_OTHER): Payer: Medicare Other

## 2012-07-12 VITALS — BP 131/72 | HR 72 | Temp 97.8°F

## 2012-07-12 DIAGNOSIS — C189 Malignant neoplasm of colon, unspecified: Secondary | ICD-10-CM

## 2012-07-12 DIAGNOSIS — Z452 Encounter for adjustment and management of vascular access device: Secondary | ICD-10-CM

## 2012-07-12 DIAGNOSIS — C180 Malignant neoplasm of cecum: Secondary | ICD-10-CM

## 2012-07-12 MED ORDER — HEPARIN SOD (PORK) LOCK FLUSH 100 UNIT/ML IV SOLN
500.0000 [IU] | Freq: Once | INTRAVENOUS | Status: AC | PRN
  Administered 2012-07-12: 500 [IU]
  Filled 2012-07-12: qty 5

## 2012-07-12 MED ORDER — SODIUM CHLORIDE 0.9 % IJ SOLN
10.0000 mL | INTRAMUSCULAR | Status: DC | PRN
  Administered 2012-07-12: 10 mL
  Filled 2012-07-12: qty 10

## 2012-07-15 ENCOUNTER — Other Ambulatory Visit: Payer: Medicare Other

## 2012-07-17 ENCOUNTER — Other Ambulatory Visit: Payer: Medicare Other | Admitting: Lab

## 2012-07-22 ENCOUNTER — Other Ambulatory Visit: Payer: Medicare Other | Admitting: Lab

## 2012-07-22 ENCOUNTER — Ambulatory Visit: Payer: Medicare Other

## 2012-07-22 ENCOUNTER — Telehealth: Payer: Self-pay | Admitting: Internal Medicine

## 2012-07-22 ENCOUNTER — Telehealth: Payer: Self-pay | Admitting: *Deleted

## 2012-07-22 NOTE — Telephone Encounter (Signed)
lvm for pt regarding time change of appt.Marland KitchenMarland KitchenMarland Kitchen

## 2012-07-22 NOTE — Telephone Encounter (Signed)
Per staff message and POF I have scheduled appts.  JMW  

## 2012-07-23 ENCOUNTER — Telehealth: Payer: Self-pay | Admitting: Internal Medicine

## 2012-07-23 ENCOUNTER — Encounter: Payer: Self-pay | Admitting: Internal Medicine

## 2012-07-23 ENCOUNTER — Other Ambulatory Visit (HOSPITAL_BASED_OUTPATIENT_CLINIC_OR_DEPARTMENT_OTHER): Payer: Medicare Other | Admitting: Lab

## 2012-07-23 ENCOUNTER — Ambulatory Visit (HOSPITAL_BASED_OUTPATIENT_CLINIC_OR_DEPARTMENT_OTHER): Payer: Medicare Other

## 2012-07-23 ENCOUNTER — Other Ambulatory Visit: Payer: Medicare Other | Admitting: Lab

## 2012-07-23 ENCOUNTER — Ambulatory Visit (HOSPITAL_BASED_OUTPATIENT_CLINIC_OR_DEPARTMENT_OTHER): Payer: Medicare Other | Admitting: Internal Medicine

## 2012-07-23 VITALS — BP 131/76 | HR 71 | Temp 97.0°F | Resp 18 | Ht 71.0 in | Wt 130.0 lb

## 2012-07-23 DIAGNOSIS — C787 Secondary malignant neoplasm of liver and intrahepatic bile duct: Secondary | ICD-10-CM

## 2012-07-23 DIAGNOSIS — C189 Malignant neoplasm of colon, unspecified: Secondary | ICD-10-CM

## 2012-07-23 DIAGNOSIS — Z5112 Encounter for antineoplastic immunotherapy: Secondary | ICD-10-CM

## 2012-07-23 DIAGNOSIS — C180 Malignant neoplasm of cecum: Secondary | ICD-10-CM

## 2012-07-23 DIAGNOSIS — C7952 Secondary malignant neoplasm of bone marrow: Secondary | ICD-10-CM

## 2012-07-23 DIAGNOSIS — C7800 Secondary malignant neoplasm of unspecified lung: Secondary | ICD-10-CM

## 2012-07-23 LAB — CBC WITH DIFFERENTIAL/PLATELET
BASO%: 0.4 % (ref 0.0–2.0)
Basophils Absolute: 0 10*3/uL (ref 0.0–0.1)
EOS%: 2.1 % (ref 0.0–7.0)
HGB: 14.1 g/dL (ref 13.0–17.1)
MCH: 34.2 pg — ABNORMAL HIGH (ref 27.2–33.4)
RBC: 4.12 10*6/uL — ABNORMAL LOW (ref 4.20–5.82)
RDW: 16.8 % — ABNORMAL HIGH (ref 11.0–14.6)
lymph#: 1.1 10*3/uL (ref 0.9–3.3)
nRBC: 0 % (ref 0–0)

## 2012-07-23 LAB — COMPREHENSIVE METABOLIC PANEL (CC13)
ALT: 36 U/L (ref 0–55)
Albumin: 2.2 g/dL — ABNORMAL LOW (ref 3.5–5.0)
Alkaline Phosphatase: 282 U/L — ABNORMAL HIGH (ref 40–150)
CO2: 27 mEq/L (ref 22–29)
Glucose: 118 mg/dl — ABNORMAL HIGH (ref 70–99)
Potassium: 4.2 mEq/L (ref 3.5–5.1)
Sodium: 137 mEq/L (ref 136–145)
Total Bilirubin: 0.59 mg/dL (ref 0.20–1.20)
Total Protein: 7.3 g/dL (ref 6.4–8.3)

## 2012-07-23 MED ORDER — SODIUM CHLORIDE 0.9 % IV SOLN
5.0000 mg/kg | Freq: Once | INTRAVENOUS | Status: AC
  Administered 2012-07-23: 300 mg via INTRAVENOUS
  Filled 2012-07-23: qty 12

## 2012-07-23 MED ORDER — SODIUM CHLORIDE 0.9 % IJ SOLN
10.0000 mL | INTRAMUSCULAR | Status: DC | PRN
  Filled 2012-07-23: qty 10

## 2012-07-23 MED ORDER — SODIUM CHLORIDE 0.9 % IJ SOLN
10.0000 mL | INTRAMUSCULAR | Status: DC | PRN
Start: 2012-07-23 — End: 2012-07-23
  Filled 2012-07-23: qty 10

## 2012-07-23 MED ORDER — SODIUM CHLORIDE 0.9 % IV SOLN
Freq: Once | INTRAVENOUS | Status: AC
  Administered 2012-07-23: 14:00:00 via INTRAVENOUS

## 2012-07-23 MED ORDER — SODIUM CHLORIDE 0.9 % IV SOLN
2400.0000 mg/m2 | INTRAVENOUS | Status: DC
  Administered 2012-07-23: 4200 mg via INTRAVENOUS
  Filled 2012-07-23: qty 84

## 2012-07-23 MED ORDER — SODIUM CHLORIDE 0.9 % IV SOLN: Freq: Once | INTRAVENOUS | Status: DC

## 2012-07-23 NOTE — Patient Instructions (Addendum)
[facility] Discharge Instructions for Patients Receiving Chemotherapy  Today you received the following chemotherapy agents Avastin and 5FU.  To help prevent nausea and vomiting after your treatment, we encourage you to take your nausea medication. Take it as often as prescribed.   If you develop nausea and vomiting that is not controlled by your nausea medication, call the clinic. If it is after clinic hours your family physician or the after hours number for the clinic or go to the Emergency Department.   BELOW ARE SYMPTOMS THAT SHOULD BE REPORTED IMMEDIATELY:  *FEVER GREATER THAN 100.5 F  *CHILLS WITH OR WITHOUT FEVER  NAUSEA AND VOMITING THAT IS NOT CONTROLLED WITH YOUR NAUSEA MEDICATION  *UNUSUAL SHORTNESS OF BREATH  *UNUSUAL BRUISING OR BLEEDING  TENDERNESS IN MOUTH AND THROAT WITH OR WITHOUT PRESENCE OF ULCERS  *URINARY PROBLEMS  *BOWEL PROBLEMS  UNUSUAL RASH Items with * indicate a potential emergency and should be followed up as soon as possible.  Feel free to call the clinic you have any questions or concerns. The clinic phone number is [phone].   I have been informed and understand all the instructions given to me. I know to contact the clinic, my physician, or go to the Emergency Department if any problems should occur. I do not have any questions at this time, but understand that I may call the clinic during office hours   should I have any questions or need assistance in obtaining follow up care.    __________________________________________  _____________  __________ Signature of Patient or Authorized Representative            Date                   Time    __________________________________________ Nurse's Signature

## 2012-07-23 NOTE — Patient Instructions (Signed)
Continue chemotherapy as scheduled. Followup in 2 weeks.

## 2012-07-23 NOTE — Progress Notes (Signed)
[facility] Telephone:([phone]   Fax:([phone]  OFFICE PROGRESS NOTE  [name], FNP [address] [place] [address]  DIAGNOSIS:  1) Metastatic colon adenocarcinoma (KRAS mutation) with metastatic lesion to the brain, lung, liver and bone diagnosed in May of 2013.  2) left ventricular mural thrombus incidentally diagnosed on recent CT scan of the chest on 02/28/2012.   PRIOR THERAPY:  1) Status post stereotactic radiosurgery to 2 brain lesions.  2) Systemic chemotherapy with FOLFOX/Avastin status post 11 cycles.   CURRENT THERAPY:  1) continuous infusion 5-FU with Avastin every 2 weeks. First cycle on 06/24/2012.  2) Coumadin 5 mg by mouth daily for the recently diagnosed left ventricular mural thrombus.  INTERVAL HISTORY: Joseph Rowland 79 y.o. male returns to the clinic today for followup visit accompanied his wife. The patient is feeling a little bit better today and gaining few pounds. He eats better. He tolerated the last cycle of his systemic chemotherapy with 5-FU and Avastin fairly well. He denied having any significant nausea or vomiting, no fever or chills. He denied having any significant chest pain, shortness breath, cough or hemoptysis.  MEDICAL HISTORY: Past Medical History  Diagnosis Date  . Lung mass   . Liver cancer   . metastatic adenocarcinoma 11/14/11    liver bx=metastatic adenocarcino55ma with tumor necrosis kras  mutation and involving brain,lung,and bones  . Brain cancer     lesions in occipital lobes,b/l suspicious mets  . Lung cancer     b/l multifocal pulmonary mets  . Hypertension   . Anemia   . History of radiation therapy 12/07/11    SRS single fraction palliative 2 intracranial lesions  . History of chemotherapy 01/07/12    FOLFAX/AVASTIN s/p 3 cycles   . Mitral regurgitation   . Dilated cardiomyopathy   . Shortness of breath     on exertion  . Chemotherapy induced neutropenia 05/20/2012     ALLERGIES:  is allergic to dronabinol.  MEDICATIONS:  Current Outpatient Prescriptions  Medication Sig Dispense Refill  . Alum & Mag Hydroxide-Simeth (MAGIC MOUTHWASH) SOLN Swish and spit 5 mLs 4 (four) times daily.      Marland Kitchen aspirin EC 81 MG tablet Take 81 mg by mouth daily.      . carvedilol (COREG) 3.125 MG tablet       . cholecalciferol (VITAMIN D) 1000 UNITS tablet Take 1,000 Units by mouth daily.      . digoxin (LANOXIN) 0.125 MG tablet       . FeFum-FePoly-FA-B Cmp-C-Biot (INTEGRA PLUS) CAPS Take 1 capsule by mouth every morning.  30 capsule  2  . HYDROcodone-acetaminophen (LORTAB) 7.5-500 MG/15ML solution Take 7.5 mg by mouth Every 6 hours as needed.      . lidocaine-prilocaine (EMLA) cream Apply 1 application topically as needed. For port-a-cath access.      Marland Kitchen lisinopril (PRINIVIL,ZESTRIL) 5 MG tablet       . PRESCRIPTION MEDICATION Inject into the vein every 14 (fourteen) days. Avastin, Leucovorin, Adrucil, Eloxatin      . vitamin B-12 (CYANOCOBALAMIN) 250 MCG tablet Take 250 mcg by mouth daily.        SURGICAL HISTORY:  Past Surgical History  Procedure Date  . Hernia repair   . Right port a cath     power port right subclavian  . Colonoscopy 12/06/2011    Procedure: COLONOSCOPY;  Surgeon: Louis Meckel, MD;  Location: [facility];  Service: Endoscopy;  Laterality: N/A;  .  Colonoscopy 12/06/2011    Procedure: COLONOSCOPY;  Surgeon: Louis Meckel, MD;  Location: [facility];  Service: Endoscopy;  Laterality: N/A;    REVIEW OF SYSTEMS:  A comprehensive review of systems was negative except for: Constitutional: positive for fatigue   PHYSICAL EXAMINATION: General appearance: alert, cooperative and no distress Head: Normocephalic, without obvious abnormality, atraumatic Neck: no adenopathy Lymph nodes: Cervical, supraclavicular, and axillary nodes normal. Resp: clear to auscultation bilaterally Cardio: regular rate and rhythm, S1, S2 normal, no murmur, click, rub or  gallop GI: soft, non-tender; bowel sounds normal; no masses,  no organomegaly Extremities: extremities normal, atraumatic, no cyanosis or edema Neurologic: Alert and oriented X 3, normal strength and tone. Normal symmetric reflexes. Normal coordination and gait  ECOG PERFORMANCE STATUS: 2 - Symptomatic, <50% confined to bed  Blood pressure 131/76, pulse 71, temperature 97 F (36.1 C), temperature source Oral, resp. rate 18, height 5\' 11"  (1.803 m), weight 130 lb (58.968 kg).  LABORATORY DATA: Lab Results  Component Value Date   WBC 8.2 07/23/2012   HGB 14.1 07/23/2012   HCT 42.9 07/23/2012   MCV 104.1* 07/23/2012   PLT 186 07/23/2012      Chemistry      Component Value Date/Time   NA 136 07/10/2012 1127   NA 134* 02/28/2012 1638   K 4.3 07/10/2012 1127   K 3.9 02/28/2012 1638   CL 102 07/10/2012 1127   CL 100 02/28/2012 1638   CO2 28 07/10/2012 1127   CO2 27 02/28/2012 1638   BUN 19.0 07/10/2012 1127   BUN 11 02/28/2012 1638   CREATININE 0.6* 07/10/2012 1127   CREATININE 0.90 02/28/2012 1638      Component Value Date/Time   CALCIUM 9.6 07/10/2012 1127   CALCIUM 9.2 02/28/2012 1638   ALKPHOS 311* 07/10/2012 1127   ALKPHOS 153* 02/04/2012 1034   AST 78* 07/10/2012 1127   AST 36 02/04/2012 1034   ALT 42 07/10/2012 1127   ALT 22 02/04/2012 1034   BILITOT 0.63 07/10/2012 1127   BILITOT 0.3 02/04/2012 1034       RADIOGRAPHIC STUDIES: Mr Laqueta Jean WU Contrast  07-14-12  *RADIOLOGY REPORT*  Clinical Data: 39-month restaging stereotactic radiation surgery. Metastatic adenocarcinoma of the colon.  MRI HEAD WITHOUT AND WITH CONTRAST  Technique:  Multiplanar, multiecho pulse sequences of the brain and surrounding structures were obtained according to standard protocol without and with intravenous contrast  Contrast: 13mL MULTIHANCE GADOBENATE DIMEGLUMINE 529 MG/ML IV SOLN  Comparison: Three prior exams most recent 03/21/2012.  Findings: Tiny area of enhancement right occipital lobe unchanged.  Interval  clearing of areas of enhancement involving the medial aspect of the right parietal lobe.  No new intracranial lesion is noted.  Slight decrease in thickness of the right palatine tonsil lesion.  Dural thickening unchanged.  Interval development of right hemispheric loculated subdural collection with maximal thickness of 6.7 mm with slight flattening of the adjacent brain.  No acute infarct.  No intracranial hemorrhage.  Mild small vessel disease type changes.  Major intracranial vascular structures are patent.  Global atrophy without hydrocephalus. Prominent cerebrospinal fluid spaces posterior fossa greater on the left unchanged.  Paranasal sinus mucosal thickening/partial opacification most notable ethmoid sinus air cells.  Transverse ligament hypertrophy.  IMPRESSION: Tiny area of enhancement right occipital lobe unchanged.  Interval clearing of areas of enhancement involving the medial aspect of the right parietal lobe.  No new intracranial lesion is noted.  Slight decrease in thickness of the right palatine  tonsil lesion.  Dural thickening unchanged.  Interval development of right hemispheric loculated subdural collection with maximal thickness of 6.7 mm with slight flattening of the adjacent brain. This may represent a subdural hygroma (secondary consideration is a chronic subdural hematoma).  This has been made a PRA call report utilizing dashboard call feature.   Original Report Authenticated By: Lacy Duverney, M.D.     ASSESSMENT: This is a very pleasant with metastatic colon adenocarcinoma currently on systemic chemotherapy with 5-FU and Avastin status post 2 cycles. He is tolerating his current chemotherapy fairly well with no significant adverse effects.  PLAN: The patient is doing fine today. He will proceed with cycle #3 today as scheduled. He would come back for followup visit in 2 weeks with the next cycle of his chemotherapy. He was advised to call immediately if she has any concerning symptoms  in the interval.  All questions were answered. The patient knows to call the clinic with any problems, questions or concerns. We can certainly see the patient much sooner if necessary.  I spent 15 minutes counseling the patient face to face. The total time spent in the appointment was 25 minutes.

## 2012-07-23 NOTE — Telephone Encounter (Signed)
gv and printed appt schedule for pt for Feb...pt ok

## 2012-07-25 ENCOUNTER — Ambulatory Visit (HOSPITAL_BASED_OUTPATIENT_CLINIC_OR_DEPARTMENT_OTHER): Payer: Medicare Other

## 2012-07-25 ENCOUNTER — Other Ambulatory Visit: Payer: Self-pay | Admitting: *Deleted

## 2012-07-25 VITALS — BP 146/74 | HR 73 | Temp 98.5°F

## 2012-07-25 DIAGNOSIS — C189 Malignant neoplasm of colon, unspecified: Secondary | ICD-10-CM

## 2012-07-25 MED ORDER — SODIUM CHLORIDE 0.9 % IJ SOLN
10.0000 mL | INTRAMUSCULAR | Status: DC | PRN
  Administered 2012-07-25: 10 mL
  Filled 2012-07-25: qty 10

## 2012-07-25 MED ORDER — HEPARIN SOD (PORK) LOCK FLUSH 100 UNIT/ML IV SOLN
500.0000 [IU] | Freq: Once | INTRAVENOUS | Status: AC | PRN
  Administered 2012-07-25: 500 [IU]
  Filled 2012-07-25: qty 5

## 2012-07-25 NOTE — Patient Instructions (Signed)
Call MD for problems or questions 

## 2012-07-28 ENCOUNTER — Telehealth: Payer: Self-pay | Admitting: Internal Medicine

## 2012-07-28 NOTE — Telephone Encounter (Signed)
Pt wife called to r/s lab appt to 02/14 @ 3:45.

## 2012-07-29 ENCOUNTER — Other Ambulatory Visit: Payer: Medicare Other | Admitting: Lab

## 2012-07-30 ENCOUNTER — Other Ambulatory Visit: Payer: Medicare Other | Admitting: Lab

## 2012-08-01 ENCOUNTER — Other Ambulatory Visit (HOSPITAL_BASED_OUTPATIENT_CLINIC_OR_DEPARTMENT_OTHER): Payer: Medicare Other | Admitting: Lab

## 2012-08-01 DIAGNOSIS — C180 Malignant neoplasm of cecum: Secondary | ICD-10-CM

## 2012-08-01 DIAGNOSIS — C189 Malignant neoplasm of colon, unspecified: Secondary | ICD-10-CM

## 2012-08-01 LAB — COMPREHENSIVE METABOLIC PANEL (CC13)
ALT: 42 U/L (ref 0–55)
AST: 64 U/L — ABNORMAL HIGH (ref 5–34)
CO2: 29 mEq/L (ref 22–29)
Creatinine: 0.8 mg/dL (ref 0.7–1.3)
Sodium: 138 mEq/L (ref 136–145)
Total Bilirubin: 0.52 mg/dL (ref 0.20–1.20)
Total Protein: 7.5 g/dL (ref 6.4–8.3)

## 2012-08-01 LAB — CBC WITH DIFFERENTIAL/PLATELET
BASO%: 0.6 % (ref 0.0–2.0)
EOS%: 2.3 % (ref 0.0–7.0)
LYMPH%: 18.5 % (ref 14.0–49.0)
MCH: 34.4 pg — ABNORMAL HIGH (ref 27.2–33.4)
MCHC: 33.2 g/dL (ref 32.0–36.0)
MONO#: 0.9 10*3/uL (ref 0.1–0.9)
Platelets: 234 10*3/uL (ref 140–400)
RBC: 3.86 10*6/uL — ABNORMAL LOW (ref 4.20–5.82)
WBC: 6.6 10*3/uL (ref 4.0–10.3)
lymph#: 1.2 10*3/uL (ref 0.9–3.3)

## 2012-08-05 ENCOUNTER — Other Ambulatory Visit: Payer: Medicare Other | Admitting: Lab

## 2012-08-05 ENCOUNTER — Ambulatory Visit: Payer: Medicare Other

## 2012-08-06 ENCOUNTER — Ambulatory Visit: Payer: Medicare Other | Admitting: Nutrition

## 2012-08-06 ENCOUNTER — Other Ambulatory Visit: Payer: Medicare Other

## 2012-08-06 ENCOUNTER — Other Ambulatory Visit: Payer: Medicare Other | Admitting: Lab

## 2012-08-06 ENCOUNTER — Ambulatory Visit (HOSPITAL_BASED_OUTPATIENT_CLINIC_OR_DEPARTMENT_OTHER): Payer: Medicare Other

## 2012-08-06 ENCOUNTER — Ambulatory Visit (HOSPITAL_BASED_OUTPATIENT_CLINIC_OR_DEPARTMENT_OTHER): Payer: Medicare Other | Admitting: Physician Assistant

## 2012-08-06 VITALS — BP 133/74 | HR 58 | Temp 97.6°F | Resp 18 | Ht 71.0 in | Wt 130.3 lb

## 2012-08-06 DIAGNOSIS — C189 Malignant neoplasm of colon, unspecified: Secondary | ICD-10-CM

## 2012-08-06 DIAGNOSIS — C787 Secondary malignant neoplasm of liver and intrahepatic bile duct: Secondary | ICD-10-CM

## 2012-08-06 DIAGNOSIS — Z5112 Encounter for antineoplastic immunotherapy: Secondary | ICD-10-CM

## 2012-08-06 DIAGNOSIS — C180 Malignant neoplasm of cecum: Secondary | ICD-10-CM

## 2012-08-06 DIAGNOSIS — C7800 Secondary malignant neoplasm of unspecified lung: Secondary | ICD-10-CM

## 2012-08-06 DIAGNOSIS — Z5111 Encounter for antineoplastic chemotherapy: Secondary | ICD-10-CM

## 2012-08-06 DIAGNOSIS — C7931 Secondary malignant neoplasm of brain: Secondary | ICD-10-CM

## 2012-08-06 LAB — COMPREHENSIVE METABOLIC PANEL (CC13)
ALT: 39 U/L (ref 0–55)
AST: 63 U/L — ABNORMAL HIGH (ref 5–34)
Alkaline Phosphatase: 272 U/L — ABNORMAL HIGH (ref 40–150)
BUN: 17.3 mg/dL (ref 7.0–26.0)
Calcium: 9.8 mg/dL (ref 8.4–10.4)
Chloride: 102 mEq/L (ref 98–107)
Creatinine: 0.7 mg/dL (ref 0.7–1.3)
Total Bilirubin: 0.71 mg/dL (ref 0.20–1.20)

## 2012-08-06 LAB — CBC WITH DIFFERENTIAL/PLATELET
BASO%: 0.6 % (ref 0.0–2.0)
EOS%: 1.4 % (ref 0.0–7.0)
HCT: 43.1 % (ref 38.4–49.9)
LYMPH%: 13.1 % — ABNORMAL LOW (ref 14.0–49.0)
MCH: 33.8 pg — ABNORMAL HIGH (ref 27.2–33.4)
MCHC: 32.7 g/dL (ref 32.0–36.0)
MCV: 103.4 fL — ABNORMAL HIGH (ref 79.3–98.0)
MONO#: 1.2 10*3/uL — ABNORMAL HIGH (ref 0.1–0.9)
MONO%: 13.9 % (ref 0.0–14.0)
NEUT%: 71 % (ref 39.0–75.0)
Platelets: 216 10*3/uL (ref 140–400)
RBC: 4.17 10*6/uL — ABNORMAL LOW (ref 4.20–5.82)
WBC: 8.3 10*3/uL (ref 4.0–10.3)
nRBC: 0 % (ref 0–0)

## 2012-08-06 MED ORDER — SODIUM CHLORIDE 0.9 % IV SOLN
5.0000 mg/kg | Freq: Once | INTRAVENOUS | Status: AC
  Administered 2012-08-06: 300 mg via INTRAVENOUS
  Filled 2012-08-06: qty 12

## 2012-08-06 MED ORDER — FLUOROURACIL CHEMO INJECTION 5 GM/100ML
2400.0000 mg/m2 | INTRAVENOUS | Status: DC
  Administered 2012-08-06: 4200 mg via INTRAVENOUS
  Filled 2012-08-06: qty 84

## 2012-08-06 NOTE — Progress Notes (Signed)
I met with patient and wife today in chemotherapy. Patient reports he is trying to eat however he continues to have a poor appetite. He will continue to drink boost one to 2 daily. His weight has declined to 130.3 pounds on February 19 which has decreased from 138 pounds November 18. Patient's wife is supportive and tries to find foods patient will tolerate. Patient reports increased nausea with oral iron supplements.  Nutrition diagnosis: Unintended weight loss continues.  Intervention: Patient was educated to attempt to increase calories in the foods that he is consuming daily. I've recommended additional protein throughout the day. I provided some education on high iron foods and provided a fact sheet for patient to take today. I provided additional coupons for boost at patient request.  Monitoring, evaluation, goals: Patient has been unable to consume adequate calories and protein to promote weight maintenance. Patient will attempt additional calories to meals and snacks to minimize further weight loss.  Next visit: Patient or wife will contact me for further questions and concerns.

## 2012-08-06 NOTE — Patient Instructions (Signed)
[facility] Discharge Instructions for Patients Receiving Chemotherapy  Today you received the following chemotherapy agents:  Avastin, 5FU.  To help prevent nausea and vomiting after your treatment, we encourage you to take your nausea medication.  If you develop nausea and vomiting that is not controlled by your nausea medication, call the clinic. If it is after clinic hours your family physician or the after hours number for the clinic or go to the Emergency Department.   BELOW ARE SYMPTOMS THAT SHOULD BE REPORTED IMMEDIATELY:  *FEVER GREATER THAN 100.5 F  *CHILLS WITH OR WITHOUT FEVER  NAUSEA AND VOMITING THAT IS NOT CONTROLLED WITH YOUR NAUSEA MEDICATION  *UNUSUAL SHORTNESS OF BREATH  *UNUSUAL BRUISING OR BLEEDING  TENDERNESS IN MOUTH AND THROAT WITH OR WITHOUT PRESENCE OF ULCERS  *URINARY PROBLEMS  *BOWEL PROBLEMS  UNUSUAL RASH Items with * indicate a potential emergency and should be followed up as soon as possible.  One of the nurses will contact you 24 hours after your treatment. Please let the nurse know about any problems that you may have experienced. Feel free to call the clinic you have any questions or concerns. The clinic phone number is [phone].

## 2012-08-06 NOTE — Patient Instructions (Addendum)
Discontinue taking your iron tablets Followup in 2 weeks prior to next cycle of chemotherapy

## 2012-08-07 ENCOUNTER — Telehealth: Payer: Self-pay | Admitting: Medical Oncology

## 2012-08-07 NOTE — Telephone Encounter (Signed)
Pt reports sore throat, no fever . Wife asking if okay for him to take alka seltzer plus cold med. I told her yes and to call if temp >/=100.5 f

## 2012-08-08 ENCOUNTER — Ambulatory Visit (HOSPITAL_BASED_OUTPATIENT_CLINIC_OR_DEPARTMENT_OTHER): Payer: Medicare Other

## 2012-08-08 VITALS — BP 123/58 | HR 62 | Temp 98.4°F | Resp 18

## 2012-08-08 DIAGNOSIS — C189 Malignant neoplasm of colon, unspecified: Secondary | ICD-10-CM

## 2012-08-08 DIAGNOSIS — C180 Malignant neoplasm of cecum: Secondary | ICD-10-CM

## 2012-08-08 DIAGNOSIS — Z452 Encounter for adjustment and management of vascular access device: Secondary | ICD-10-CM

## 2012-08-08 MED ORDER — HEPARIN SOD (PORK) LOCK FLUSH 100 UNIT/ML IV SOLN
500.0000 [IU] | Freq: Once | INTRAVENOUS | Status: AC | PRN
  Administered 2012-08-08: 500 [IU]
  Filled 2012-08-08: qty 5

## 2012-08-08 MED ORDER — SODIUM CHLORIDE 0.9 % IJ SOLN
10.0000 mL | INTRAMUSCULAR | Status: DC | PRN
  Administered 2012-08-08: 10 mL
  Filled 2012-08-08: qty 10

## 2012-08-08 NOTE — Progress Notes (Addendum)
[name] [phone]   Fax:(336) [phone]  OFFICE PROGRESS NOTE  Joseph Pierini, FNP [address] [place] [address]  DIAGNOSIS:  1) Metastatic colon adenocarcinoma (KRAS mutation) with metastatic lesion to the brain, lung, liver and bone diagnosed in May of 2013.  2) left ventricular mural thrombus incidentally diagnosed on recent CT scan of the chest on 02/28/2012.   PRIOR THERAPY:  1) Status post stereotactic radiosurgery to 2 brain lesions.  2) Systemic chemotherapy with FOLFOX/Avastin status post 11 cycles.   CURRENT THERAPY:  1) continuous infusion 5-FU with Avastin every 2 weeks. First cycle on 06/24/2012. Status post 3 cycles 2) Coumadin 5 mg by mouth daily for the recently diagnosed left ventricular mural thrombus.  INTERVAL HISTORY: Joseph Rowland 79 y.o. male returns to the clinic today for followup visit accompanied his wife. The patient is feeling a little bit better today. He continues to have some fatigue. He managed to maintain his weight. His wife states that he ran out of his Integra plus and she started him on an over-the-counter iron supplement. The over-the-counter iron supplement take him sick every time he took it. They are also confused as to whether he is to get weekly labs drawn. He is eating better. He tolerated the last cycle of his systemic chemotherapy with 5-FU and Avastin fairly well. He denied having any significant nausea or vomiting, no fever or chills. He denied having any significant chest pain, shortness breath, cough or hemoptysis.  MEDICAL HISTORY: Past Medical History  Diagnosis Date  . Lung mass   . Liver cancer   . metastatic adenocarcinoma 11/14/11    liver bx=metastatic adenocarcino41ma with tumor necrosis kras  mutation and involving brain,lung,and bones  . Brain cancer     lesions in occipital lobes,b/l suspicious mets  . Lung cancer     b/l multifocal pulmonary mets  . Hypertension    . Anemia   . History of radiation therapy 12/07/11    SRS single fraction palliative 2 intracranial lesions  . History of chemotherapy 01/07/12    FOLFAX/AVASTIN s/p 3 cycles   . Mitral regurgitation   . Dilated cardiomyopathy   . Shortness of breath     on exertion  . Chemotherapy induced neutropenia 05/20/2012    ALLERGIES:  is allergic to dronabinol.  MEDICATIONS:  Current Outpatient Prescriptions  Medication Sig Dispense Refill  . Alum & Mag Hydroxide-Simeth (MAGIC MOUTHWASH) SOLN Swish and spit 5 mLs 4 (four) times daily.      Marland Kitchen aspirin EC 81 MG tablet Take 81 mg by mouth daily.      . carvedilol (COREG) 3.125 MG tablet       . cholecalciferol (VITAMIN D) 1000 UNITS tablet Take 1,000 Units by mouth daily.      . digoxin (LANOXIN) 0.125 MG tablet       . FeFum-FePoly-FA-B Cmp-C-Biot (INTEGRA PLUS) CAPS Take 1 capsule by mouth every morning.  30 capsule  2  . HYDROcodone-acetaminophen (LORTAB) 7.5-500 MG/15ML solution Take 7.5 mg by mouth Every 6 hours as needed.      . lidocaine-prilocaine (EMLA) cream Apply 1 application topically as needed. For port-a-cath access.      Marland Kitchen lisinopril (PRINIVIL,ZESTRIL) 5 MG tablet       . PRESCRIPTION MEDICATION Inject into the vein every 14 (fourteen) days. Avastin, Leucovorin, Adrucil, Eloxatin      . vitamin B-12 (CYANOCOBALAMIN) 250 MCG tablet Take 250 mcg by mouth daily.  No current facility-administered medications for this visit.    SURGICAL HISTORY:  Past Surgical History  Procedure Laterality Date  . Hernia repair    . Right port a cath      power port right subclavian  . Colonoscopy  12/06/2011    Procedure: COLONOSCOPY;  Surgeon: Louis Meckel, MD;  Location: [facility];  Service: Endoscopy;  Laterality: N/A;  . Colonoscopy  12/06/2011    Procedure: COLONOSCOPY;  Surgeon: Louis Meckel, MD;  Location: [facility];  Service: Endoscopy;  Laterality: N/A;    REVIEW OF SYSTEMS:  A comprehensive review of systems was  negative except for: Constitutional: positive for fatigue   PHYSICAL EXAMINATION: General appearance: alert, cooperative and no distress Head: Normocephalic, without obvious abnormality, atraumatic Neck: no adenopathy Lymph nodes: Cervical, supraclavicular, and axillary nodes normal. Resp: clear to auscultation bilaterally Cardio: regular rate and rhythm, S1, S2 normal, no murmur, click, rub or gallop GI: soft, non-tender; bowel sounds normal; no masses,  no organomegaly Extremities: extremities normal, atraumatic, no cyanosis or edema Neurologic: Alert and oriented X 3, normal strength and tone. Normal symmetric reflexes. Normal coordination and gait  ECOG PERFORMANCE STATUS: 2 - Symptomatic, <50% confined to bed  Blood pressure 133/74, pulse 58, temperature 97.6 F (36.4 C), temperature source Oral, resp. rate 18, height 5\' 11"  (1.803 m), weight 130 lb 4.8 oz (59.104 kg).  LABORATORY DATA: Lab Results  Component Value Date   WBC 8.3 08/06/2012   HGB 14.1 08/06/2012   HCT 43.1 08/06/2012   MCV 103.4* 08/06/2012   PLT 216 08/06/2012      Chemistry      Component Value Date/Time   NA 138 08/06/2012 0922   NA 134* 02/28/2012 1638   K 4.2 08/06/2012 0922   K 3.9 02/28/2012 1638   CL 102 08/06/2012 0922   CL 100 02/28/2012 1638   CO2 29 08/06/2012 0922   CO2 27 02/28/2012 1638   BUN 17.3 08/06/2012 0922   BUN 11 02/28/2012 1638   CREATININE 0.7 08/06/2012 0922   CREATININE 0.90 02/28/2012 1638      Component Value Date/Time   CALCIUM 9.8 08/06/2012 0922   CALCIUM 9.2 02/28/2012 1638   ALKPHOS 272* 08/06/2012 0922   ALKPHOS 153* 02/04/2012 1034   AST 63* 08/06/2012 0922   AST 36 02/04/2012 1034   ALT 39 08/06/2012 0922   ALT 22 02/04/2012 1034   BILITOT 0.71 08/06/2012 0922   BILITOT 0.3 02/04/2012 1034       RADIOGRAPHIC STUDIES: Mr Joseph Rowland Contrast  2012/07/02  *RADIOLOGY REPORT*  Clinical Data: 80-month restaging stereotactic radiation surgery. Metastatic adenocarcinoma of the colon.   MRI HEAD WITHOUT AND WITH CONTRAST  Technique:  Multiplanar, multiecho pulse sequences of the brain and surrounding structures were obtained according to standard protocol without and with intravenous contrast  Contrast: 13mL MULTIHANCE GADOBENATE DIMEGLUMINE 529 MG/ML IV SOLN  Comparison: Three prior exams most recent 03/21/2012.  Findings: Tiny area of enhancement right occipital lobe unchanged.  Interval clearing of areas of enhancement involving the medial aspect of the right parietal lobe.  No new intracranial lesion is noted.  Slight decrease in thickness of the right palatine tonsil lesion.  Dural thickening unchanged.  Interval development of right hemispheric loculated subdural collection with maximal thickness of 6.7 mm with slight flattening of the adjacent brain.  No acute infarct.  No intracranial hemorrhage.  Mild small vessel disease type changes.  Major intracranial vascular structures are patent.  Global  atrophy without hydrocephalus. Prominent cerebrospinal fluid spaces posterior fossa greater on the left unchanged.  Paranasal sinus mucosal thickening/partial opacification most notable ethmoid sinus air cells.  Transverse ligament hypertrophy.  IMPRESSION: Tiny area of enhancement right occipital lobe unchanged.  Interval clearing of areas of enhancement involving the medial aspect of the right parietal lobe.  No new intracranial lesion is noted.  Slight decrease in thickness of the right palatine tonsil lesion.  Dural thickening unchanged.  Interval development of right hemispheric loculated subdural collection with maximal thickness of 6.7 mm with slight flattening of the adjacent brain. This may represent a subdural hygroma (secondary consideration is a chronic subdural hematoma).  This has been made a PRA call report utilizing dashboard call feature.   Original Report Authenticated By: Lacy Duverney, M.D.     ASSESSMENT/PLAN: This is a very pleasant with metastatic colon adenocarcinoma  currently on systemic chemotherapy with 5-FU and Avastin status post 3 cycles. He is tolerating his current chemotherapy fairly well with no significant adverse effects. Patient was discussed with Dr. Arbutus Ped. He'll proceed with cycle #4 today as scheduled. He will followup in 2 weeks with repeat CBC differential and C. met prior to his next cycle of chemotherapy. We will discontinue weekly labs and have labs drawn with day of treatment. He may also discontinue his iron supplementation as his hemoglobin of 14.1 cm range. We will continue to monitor his hemoglobin closely.  Laural Benes, Evren Shankland E, PA-C   All questions were answered. The patient knows to call the clinic with any problems, questions or concerns. We can certainly see the patient much sooner if necessary.  I spent 20 minutes counseling the patient face to face. The total time spent in the appointment was 30 minutes.

## 2012-08-09 ENCOUNTER — Telehealth: Payer: Self-pay | Admitting: *Deleted

## 2012-08-09 NOTE — Telephone Encounter (Signed)
Per staff message and POF I have scheduled appts.  JMW  

## 2012-08-11 ENCOUNTER — Telehealth: Payer: Self-pay | Admitting: Internal Medicine

## 2012-08-12 ENCOUNTER — Other Ambulatory Visit: Payer: Medicare Other | Admitting: Lab

## 2012-08-12 ENCOUNTER — Telehealth: Payer: Self-pay | Admitting: Internal Medicine

## 2012-08-12 ENCOUNTER — Telehealth: Payer: Self-pay | Admitting: Medical Oncology

## 2012-08-12 NOTE — Telephone Encounter (Signed)
, °

## 2012-08-12 NOTE — Telephone Encounter (Signed)
Had large stool ' size of half of his intestine" last wed and now has Issue with constipation. I told pt wife that caltrate can cause constipation and to start pt on senna-s daily

## 2012-08-13 ENCOUNTER — Other Ambulatory Visit: Payer: Medicare Other

## 2012-08-19 ENCOUNTER — Telehealth: Payer: Self-pay | Admitting: *Deleted

## 2012-08-19 ENCOUNTER — Telehealth: Payer: Self-pay | Admitting: Medical Oncology

## 2012-08-19 NOTE — Telephone Encounter (Signed)
Wife requests to cancel appts this week due to weather and r/s next week. I sent Onc tx request

## 2012-08-19 NOTE — Telephone Encounter (Signed)
Per staff message and POF I have scheduled appts.  JMW  

## 2012-08-20 ENCOUNTER — Ambulatory Visit: Payer: Medicare Other | Admitting: Physician Assistant

## 2012-08-20 ENCOUNTER — Ambulatory Visit: Payer: Medicare Other

## 2012-08-20 ENCOUNTER — Other Ambulatory Visit: Payer: Medicare Other

## 2012-08-21 ENCOUNTER — Other Ambulatory Visit: Payer: Self-pay | Admitting: Internal Medicine

## 2012-08-26 ENCOUNTER — Other Ambulatory Visit (HOSPITAL_BASED_OUTPATIENT_CLINIC_OR_DEPARTMENT_OTHER): Payer: Medicare Other

## 2012-08-26 ENCOUNTER — Ambulatory Visit (HOSPITAL_BASED_OUTPATIENT_CLINIC_OR_DEPARTMENT_OTHER): Payer: Medicare Other

## 2012-08-26 ENCOUNTER — Telehealth: Payer: Self-pay | Admitting: Internal Medicine

## 2012-08-26 ENCOUNTER — Ambulatory Visit (HOSPITAL_BASED_OUTPATIENT_CLINIC_OR_DEPARTMENT_OTHER): Payer: Medicare Other | Admitting: Physician Assistant

## 2012-08-26 VITALS — BP 130/63 | HR 69 | Temp 97.6°F | Resp 18 | Ht 71.0 in | Wt 130.6 lb

## 2012-08-26 DIAGNOSIS — C180 Malignant neoplasm of cecum: Secondary | ICD-10-CM

## 2012-08-26 DIAGNOSIS — C189 Malignant neoplasm of colon, unspecified: Secondary | ICD-10-CM

## 2012-08-26 LAB — CBC WITH DIFFERENTIAL/PLATELET
Basophils Absolute: 0 10*3/uL (ref 0.0–0.1)
EOS%: 1.7 % (ref 0.0–7.0)
Eosinophils Absolute: 0.1 10*3/uL (ref 0.0–0.5)
HCT: 43 % (ref 38.4–49.9)
HGB: 14.3 g/dL (ref 13.0–17.1)
MCH: 33.4 pg (ref 27.2–33.4)
MCV: 100.5 fL — ABNORMAL HIGH (ref 79.3–98.0)
MONO%: 12.9 % (ref 0.0–14.0)
NEUT#: 5.2 10*3/uL (ref 1.5–6.5)
NEUT%: 70.6 % (ref 39.0–75.0)
Platelets: 201 10*3/uL (ref 140–400)
RDW: 16.5 % — ABNORMAL HIGH (ref 11.0–14.6)

## 2012-08-26 LAB — COMPREHENSIVE METABOLIC PANEL (CC13)
Albumin: 2.2 g/dL — ABNORMAL LOW (ref 3.5–5.0)
Alkaline Phosphatase: 305 U/L — ABNORMAL HIGH (ref 40–150)
BUN: 22.8 mg/dL (ref 7.0–26.0)
CO2: 28 mEq/L (ref 22–29)
Glucose: 109 mg/dl — ABNORMAL HIGH (ref 70–99)
Sodium: 136 mEq/L (ref 136–145)
Total Bilirubin: 0.8 mg/dL (ref 0.20–1.20)
Total Protein: 7.5 g/dL (ref 6.4–8.3)

## 2012-08-26 MED ORDER — LORAZEPAM 0.5 MG PO TABS: ORAL_TABLET | ORAL | Status: DC

## 2012-08-26 MED ORDER — DRONABINOL 2.5 MG PO CAPS: 2.5000 mg | ORAL_CAPSULE | Freq: Two times a day (BID) | ORAL | Status: DC

## 2012-08-26 MED ORDER — SODIUM CHLORIDE 0.9 % IV SOLN
Freq: Once | INTRAVENOUS | Status: AC
  Administered 2012-08-26: 13:00:00 via INTRAVENOUS

## 2012-08-26 MED ORDER — SODIUM CHLORIDE 0.9 % IV SOLN
5.0000 mg/kg | Freq: Once | INTRAVENOUS | Status: AC
  Administered 2012-08-26: 300 mg via INTRAVENOUS
  Filled 2012-08-26: qty 12

## 2012-08-26 MED ORDER — SODIUM CHLORIDE 0.9 % IV SOLN
2400.0000 mg/m2 | INTRAVENOUS | Status: DC
  Administered 2012-08-26: 4200 mg via INTRAVENOUS
  Filled 2012-08-26: qty 84

## 2012-08-26 NOTE — Patient Instructions (Addendum)
[facility] Discharge Instructions for Patients Receiving Chemotherapy  Today you received the following chemotherapy agents Continuous infusion 5FU pump and Avastin. Return to clinic on Thursday 08/28/12 @ 1230pm to have your pump removed.  To help prevent nausea and vomiting after your treatment, we encourage you to take your nausea medication as directed.   If you develop nausea and vomiting that is not controlled by your nausea medication, call the clinic. If it is after clinic hours your family physician or the after hours number for the clinic or go to the Emergency Department.   BELOW ARE SYMPTOMS THAT SHOULD BE REPORTED IMMEDIATELY:  *FEVER GREATER THAN 100.5 F  *CHILLS WITH OR WITHOUT FEVER  NAUSEA AND VOMITING THAT IS NOT CONTROLLED WITH YOUR NAUSEA MEDICATION  *UNUSUAL SHORTNESS OF BREATH  *UNUSUAL BRUISING OR BLEEDING  TENDERNESS IN MOUTH AND THROAT WITH OR WITHOUT PRESENCE OF ULCERS  *URINARY PROBLEMS  *BOWEL PROBLEMS  UNUSUAL RASH Items with * indicate a potential emergency and should be followed up as soon as possible.

## 2012-08-26 NOTE — Patient Instructions (Addendum)
At your request regarding to try you again on Marinol for appetite stimulation To address your issues with anxiety and difficulty resting your being prescribed Ativan, be sure to take as prescribed Followup in 2 weeks

## 2012-08-26 NOTE — Telephone Encounter (Signed)
PT IS TO HAVE LABS AND CHEMO EVERY TWO WEEKS PER AJ,  PAPER FOR TX PRINTED FOR MELLISSA....SP

## 2012-08-26 NOTE — Progress Notes (Signed)
Urine dipstick protein was trace.

## 2012-08-27 ENCOUNTER — Other Ambulatory Visit: Payer: Self-pay | Admitting: Certified Registered Nurse Anesthetist

## 2012-08-28 ENCOUNTER — Ambulatory Visit (HOSPITAL_BASED_OUTPATIENT_CLINIC_OR_DEPARTMENT_OTHER): Payer: Medicare Other

## 2012-08-28 VITALS — BP 128/61 | HR 70 | Temp 98.0°F

## 2012-08-28 DIAGNOSIS — C787 Secondary malignant neoplasm of liver and intrahepatic bile duct: Secondary | ICD-10-CM

## 2012-08-28 DIAGNOSIS — C180 Malignant neoplasm of cecum: Secondary | ICD-10-CM

## 2012-08-28 MED ORDER — SODIUM CHLORIDE 0.9 % IJ SOLN
10.0000 mL | INTRAMUSCULAR | Status: DC | PRN
  Administered 2012-08-28: 10 mL
  Filled 2012-08-28: qty 10

## 2012-08-28 MED ORDER — HEPARIN SOD (PORK) LOCK FLUSH 100 UNIT/ML IV SOLN
500.0000 [IU] | Freq: Once | INTRAVENOUS | Status: AC | PRN
  Administered 2012-08-28: 500 [IU]
  Filled 2012-08-28: qty 5

## 2012-08-28 NOTE — Patient Instructions (Signed)
Call MD for problems or concerns 

## 2012-08-31 NOTE — Progress Notes (Signed)
[facility] Telephone:(336) [phone]   Fax:(336) [phone]  OFFICE PROGRESS NOTE  [name], FNP [address] [place] [address]  DIAGNOSIS:  1) Metastatic colon adenocarcinoma (KRAS mutation) with metastatic lesion to the brain, lung, liver and bone diagnosed in May of 2013.  2) left ventricular mural thrombus incidentally diagnosed on recent CT scan of the chest on 02/28/2012.   PRIOR THERAPY:  1) Status post stereotactic radiosurgery to 2 brain lesions.  2) Systemic chemotherapy with FOLFOX/Avastin status post 11 cycles.   CURRENT THERAPY:  1) continuous infusion 5-FU with Avastin every 2 weeks. First cycle on 06/24/2012. Status post 4 cycles 2) Coumadin 5 mg by mouth daily for the recently diagnosed left ventricular mural thrombus.  INTERVAL HISTORY: Joseph Rowland 79 y.o. male returns to the clinic today for followup visit accompanied his wife. He continues to have poor appetite and did not have any improvement after a Medrol Dosepak. He was on Marinol in the past and it gave him hiccups however he is willing to give this medication a try again. The patient's wife reports that she would like him to go back on the herbal medications he was taking for his blood pressure. She will discuss this with his cardiologist. Reports that he is not sleeping well he states that he feels like he cannot get a deep breath. This is been on for the past month. His wife states that she notes some increased anxiety/agitation. She feels that his sensation of not being indeed the breath is related to the anxiety and agitation.  He continues to have some fatigue. He tolerated the last cycle of his systemic chemotherapy with 5-FU and Avastin fairly well. He denied having any significant nausea or vomiting, no fever or chills. He denied having any significant chest pain, shortness breath, cough or hemoptysis.  MEDICAL HISTORY: Past Medical History  Diagnosis Date  . Lung  mass   . Liver cancer   . metastatic adenocarcinoma 11/14/11    liver bx=metastatic adenocarcino53ma with tumor necrosis kras  mutation and involving brain,lung,and bones  . Brain cancer     lesions in occipital lobes,b/l suspicious mets  . Lung cancer     b/l multifocal pulmonary mets  . Hypertension   . Anemia   . History of radiation therapy 12/07/11    SRS single fraction palliative 2 intracranial lesions  . History of chemotherapy 01/07/12    FOLFAX/AVASTIN s/p 3 cycles   . Mitral regurgitation   . Dilated cardiomyopathy   . Shortness of breath     on exertion  . Chemotherapy induced neutropenia 05/20/2012    ALLERGIES:  is allergic to dronabinol.  MEDICATIONS:  Current Outpatient Prescriptions  Medication Sig Dispense Refill  . aspirin EC 81 MG tablet Take 81 mg by mouth daily.      . carvedilol (COREG) 3.125 MG tablet       . cholecalciferol (VITAMIN D) 1000 UNITS tablet Take 1,000 Units by mouth daily.      . digoxin (LANOXIN) 0.125 MG tablet       . dronabinol (MARINOL) 2.5 MG capsule Take 1 capsule (2.5 mg total) by mouth 2 (two) times daily before a meal.  30 capsule  0  . lidocaine-prilocaine (EMLA) cream Apply 1 application topically as needed. For port-a-cath access.      Marland Kitchen lisinopril (PRINIVIL,ZESTRIL) 5 MG tablet       . LORazepam (ATIVAN) 0.5 MG tablet Take 1 tablet by mouth  at bedtime as needed for rest and anxiety  30 tablet  0  . PRESCRIPTION MEDICATION Inject into the vein every 14 (fourteen) days. Avastin, Leucovorin, Adrucil, Eloxatin      . vitamin B-12 (CYANOCOBALAMIN) 250 MCG tablet Take 250 mcg by mouth daily.       No current facility-administered medications for this visit.    SURGICAL HISTORY:  Past Surgical History  Procedure Laterality Date  . Hernia repair    . Right port a cath      power port right subclavian  . Colonoscopy  12/06/2011    Procedure: COLONOSCOPY;  Surgeon: Louis Meckel, MD;  Location: [facility];  Service: Endoscopy;   Laterality: N/A;  . Colonoscopy  12/06/2011    Procedure: COLONOSCOPY;  Surgeon: Louis Meckel, MD;  Location: [facility];  Service: Endoscopy;  Laterality: N/A;    REVIEW OF SYSTEMS:  A comprehensive review of systems was negative except for: Constitutional: positive for anorexia and fatigue   PHYSICAL EXAMINATION: General appearance: alert, cooperative and no distress Head: Normocephalic, without obvious abnormality, atraumatic Neck: no adenopathy Lymph nodes: Cervical, supraclavicular, and axillary nodes normal. Resp: clear to auscultation bilaterally Cardio: regular rate and rhythm, S1, S2 normal, no murmur, click, rub or gallop GI: soft, non-tender; bowel sounds normal; no masses,  no organomegaly Extremities: extremities normal, atraumatic, no cyanosis or edema Neurologic: Alert and oriented X 3, normal strength and tone. Normal symmetric reflexes. Normal coordination and gait  ECOG PERFORMANCE STATUS: 2 - Symptomatic, <50% confined to bed  Blood pressure 130/63, pulse 69, temperature 97.6 F (36.4 C), temperature source Oral, resp. rate 18, height 5\' 11"  (1.803 m), weight 130 lb 9.6 oz (59.24 kg).  LABORATORY DATA: Lab Results  Component Value Date   WBC 7.4 08/26/2012   HGB 14.3 08/26/2012   HCT 43.0 08/26/2012   MCV 100.5* 08/26/2012   PLT 201 08/26/2012      Chemistry      Component Value Date/Time   NA 136 08/26/2012 1112   NA 134* 02/28/2012 1638   K 4.3 08/26/2012 1112   K 3.9 02/28/2012 1638   CL 101 08/26/2012 1112   CL 100 02/28/2012 1638   CO2 28 08/26/2012 1112   CO2 27 02/28/2012 1638   BUN 22.8 08/26/2012 1112   BUN 11 02/28/2012 1638   CREATININE 0.7 08/26/2012 1112   CREATININE 0.90 02/28/2012 1638      Component Value Date/Time   CALCIUM 9.9 08/26/2012 1112   CALCIUM 9.2 02/28/2012 1638   ALKPHOS 305* 08/26/2012 1112   ALKPHOS 153* 02/04/2012 1034   AST 71* 08/26/2012 1112   AST 36 02/04/2012 1034   ALT 50 08/26/2012 1112   ALT 22 02/04/2012 1034   BILITOT  0.80 08/26/2012 1112   BILITOT 0.3 02/04/2012 1034       RADIOGRAPHIC STUDIES: Mr [name] Contrast  07-18-2012  *RADIOLOGY REPORT*  Clinical Data: 33-month restaging stereotactic radiation surgery. Metastatic adenocarcinoma of the colon.  MRI HEAD WITHOUT AND WITH CONTRAST  Technique:  Multiplanar, multiecho pulse sequences of the brain and surrounding structures were obtained according to standard protocol without and with intravenous contrast  Contrast: 13mL MULTIHANCE GADOBENATE DIMEGLUMINE 529 MG/ML IV SOLN  Comparison: Three prior exams most recent 03/21/2012.  Findings: Tiny area of enhancement right occipital lobe unchanged.  Interval clearing of areas of enhancement involving the medial aspect of the right parietal lobe.  No new intracranial lesion is noted.  Slight decrease in thickness of  the right palatine tonsil lesion.  Dural thickening unchanged.  Interval development of right hemispheric loculated subdural collection with maximal thickness of 6.7 mm with slight flattening of the adjacent brain.  No acute infarct.  No intracranial hemorrhage.  Mild small vessel disease type changes.  Major intracranial vascular structures are patent.  Global atrophy without hydrocephalus. Prominent cerebrospinal fluid spaces posterior fossa greater on the left unchanged.  Paranasal sinus mucosal thickening/partial opacification most notable ethmoid sinus air cells.  Transverse ligament hypertrophy.  IMPRESSION: Tiny area of enhancement right occipital lobe unchanged.  Interval clearing of areas of enhancement involving the medial aspect of the right parietal lobe.  No new intracranial lesion is noted.  Slight decrease in thickness of the right palatine tonsil lesion.  Dural thickening unchanged.  Interval development of right hemispheric loculated subdural collection with maximal thickness of 6.7 mm with slight flattening of the adjacent brain. This may represent a subdural hygroma (secondary consideration is a  chronic subdural hematoma).  This has been made a PRA call report utilizing dashboard call feature.   Original Report Authenticated By: Lacy Duverney, M.D.     ASSESSMENT/PLAN: This is a very pleasant with metastatic colon adenocarcinoma currently on systemic chemotherapy with 5-FU and Avastin status post 3 cycles. He is tolerating his current chemotherapy fairly well with no significant adverse effects. Patient was discussed with Dr. Arbutus Ped. He'll proceed with cycle #5 today as scheduled. He will followup in 2 weeks with repeat CBC differential and C. met prior to his next cycle of chemotherapy. Regarding his anorexia the patient is willing to try Marinol again. He is given a prescription for 2.5 mg taken on by mouth twice daily however we will discontinue this at two-week trial and reevaluate when he returns in 2 weeks for his next cycle of chemotherapy. For his anxiety and agitation is given a prescription for Ativan 0.5 mg to be taken in the evenings initially and we will reevaluate the efficacy of this as well when he returns in 2 weeks. Both the patient and his wife are in agreement with the plan of initiating both of these medications.  Joseph Rowland, Joseph Cummiskey E, PA-C   All questions were answered. The patient knows to call the clinic with any problems, questions or concerns. We can certainly see the patient much sooner if necessary.  I spent 20 minutes counseling the patient face to face. The total time spent in the appointment was 30 minutes.

## 2012-09-09 ENCOUNTER — Ambulatory Visit (HOSPITAL_BASED_OUTPATIENT_CLINIC_OR_DEPARTMENT_OTHER): Payer: Medicare Other

## 2012-09-09 ENCOUNTER — Ambulatory Visit (HOSPITAL_BASED_OUTPATIENT_CLINIC_OR_DEPARTMENT_OTHER): Payer: Medicare Other | Admitting: Physician Assistant

## 2012-09-09 ENCOUNTER — Other Ambulatory Visit: Payer: Self-pay | Admitting: *Deleted

## 2012-09-09 ENCOUNTER — Telehealth: Payer: Self-pay | Admitting: Internal Medicine

## 2012-09-09 ENCOUNTER — Other Ambulatory Visit (HOSPITAL_BASED_OUTPATIENT_CLINIC_OR_DEPARTMENT_OTHER): Payer: Medicare Other | Admitting: Lab

## 2012-09-09 ENCOUNTER — Other Ambulatory Visit: Payer: Self-pay | Admitting: Physician Assistant

## 2012-09-09 ENCOUNTER — Encounter: Payer: Self-pay | Admitting: Physician Assistant

## 2012-09-09 ENCOUNTER — Encounter: Payer: Self-pay | Admitting: Internal Medicine

## 2012-09-09 VITALS — BP 144/66 | HR 62 | Temp 97.9°F | Resp 18 | Ht 71.0 in | Wt 134.6 lb

## 2012-09-09 DIAGNOSIS — R630 Anorexia: Secondary | ICD-10-CM

## 2012-09-09 DIAGNOSIS — C189 Malignant neoplasm of colon, unspecified: Secondary | ICD-10-CM

## 2012-09-09 DIAGNOSIS — C180 Malignant neoplasm of cecum: Secondary | ICD-10-CM

## 2012-09-09 DIAGNOSIS — Z5111 Encounter for antineoplastic chemotherapy: Secondary | ICD-10-CM

## 2012-09-09 DIAGNOSIS — C7800 Secondary malignant neoplasm of unspecified lung: Secondary | ICD-10-CM

## 2012-09-09 DIAGNOSIS — C787 Secondary malignant neoplasm of liver and intrahepatic bile duct: Secondary | ICD-10-CM

## 2012-09-09 DIAGNOSIS — Z5112 Encounter for antineoplastic immunotherapy: Secondary | ICD-10-CM

## 2012-09-09 LAB — CBC WITH DIFFERENTIAL/PLATELET
BASO%: 0.2 % (ref 0.0–2.0)
Basophils Absolute: 0 10*3/uL (ref 0.0–0.1)
EOS%: 0.8 % (ref 0.0–7.0)
HGB: 14.2 g/dL (ref 13.0–17.1)
MCH: 32.9 pg (ref 27.2–33.4)
RDW: 16.7 % — ABNORMAL HIGH (ref 11.0–14.6)
lymph#: 1 10*3/uL (ref 0.9–3.3)

## 2012-09-09 LAB — COMPREHENSIVE METABOLIC PANEL (CC13)
ALT: 45 U/L (ref 0–55)
AST: 74 U/L — ABNORMAL HIGH (ref 5–34)
Albumin: 2.1 g/dL — ABNORMAL LOW (ref 3.5–5.0)
BUN: 24.8 mg/dL (ref 7.0–26.0)
Calcium: 9.6 mg/dL (ref 8.4–10.4)
Chloride: 104 mEq/L (ref 98–107)
Potassium: 4.1 mEq/L (ref 3.5–5.1)
Sodium: 138 mEq/L (ref 136–145)
Total Protein: 7.1 g/dL (ref 6.4–8.3)

## 2012-09-09 MED ORDER — SODIUM CHLORIDE 0.9 % IJ SOLN
10.0000 mL | INTRAMUSCULAR | Status: DC | PRN
  Administered 2012-09-09: 10 mL
  Filled 2012-09-09: qty 10

## 2012-09-09 MED ORDER — SODIUM CHLORIDE 0.9 % IV SOLN
Freq: Once | INTRAVENOUS | Status: AC
  Administered 2012-09-09: 13:00:00 via INTRAVENOUS

## 2012-09-09 MED ORDER — DRONABINOL 2.5 MG PO CAPS: 2.5000 mg | ORAL_CAPSULE | Freq: Two times a day (BID) | ORAL | Status: DC

## 2012-09-09 MED ORDER — SODIUM CHLORIDE 0.9 % IV SOLN
2400.0000 mg/m2 | INTRAVENOUS | Status: DC
  Administered 2012-09-09: 4200 mg via INTRAVENOUS
  Filled 2012-09-09: qty 84

## 2012-09-09 MED ORDER — SODIUM CHLORIDE 0.9 % IV SOLN
5.0000 mg/kg | Freq: Once | INTRAVENOUS | Status: AC
  Administered 2012-09-09: 300 mg via INTRAVENOUS
  Filled 2012-09-09: qty 12

## 2012-09-09 NOTE — Telephone Encounter (Signed)
Gave pt appt for lab, chemo and MD for march and April 2014

## 2012-09-09 NOTE — Progress Notes (Signed)
Urine protein negative 

## 2012-09-09 NOTE — Patient Instructions (Addendum)
Continue labs and chemotherapy as scheduled Followup with Dr. Arbutus Ped in 2 weeks with a restaging CT scan of the chest, abdomen and pelvis with contrast to reevaluate your disease Continue on the Marinol for appetite stimulation.

## 2012-09-09 NOTE — Patient Instructions (Addendum)
[facility] Discharge Instructions for Patients Receiving Chemotherapy  Today you received the following chemotherapy agents Avastin/5FU  To help prevent nausea and vomiting after your treatment, we encourage you to take your nausea medication as prescribed.   If you develop nausea and vomiting that is not controlled by your nausea medication, call the clinic. If it is after clinic hours your family physician or the after hours number for the clinic or go to the Emergency Department.   BELOW ARE SYMPTOMS THAT SHOULD BE REPORTED IMMEDIATELY:  *FEVER GREATER THAN 100.5 F  *CHILLS WITH OR WITHOUT FEVER  NAUSEA AND VOMITING THAT IS NOT CONTROLLED WITH YOUR NAUSEA MEDICATION  *UNUSUAL SHORTNESS OF BREATH  *UNUSUAL BRUISING OR BLEEDING  TENDERNESS IN MOUTH AND THROAT WITH OR WITHOUT PRESENCE OF ULCERS  *URINARY PROBLEMS  *BOWEL PROBLEMS  UNUSUAL RASH Items with * indicate a potential emergency and should be followed up as soon as possible.  Feel free to call the clinic you have any questions or concerns. The clinic phone number is [phone].   I have been informed and understand all the instructions given to me. I know to contact the clinic, my physician, or go to the Emergency Department if any problems should occur. I do not have any questions at this time, but understand that I may call the clinic during office hours   should I have any questions or need assistance in obtaining follow up care.    __________________________________________  _____________  __________ Signature of Patient or Authorized Representative            Date                   Time    __________________________________________ Nurse's Signature

## 2012-09-10 ENCOUNTER — Other Ambulatory Visit: Payer: Self-pay | Admitting: Certified Registered Nurse Anesthetist

## 2012-09-11 ENCOUNTER — Ambulatory Visit (HOSPITAL_BASED_OUTPATIENT_CLINIC_OR_DEPARTMENT_OTHER): Payer: Medicare Other

## 2012-09-11 VITALS — BP 137/64 | HR 74 | Temp 97.0°F

## 2012-09-11 DIAGNOSIS — C180 Malignant neoplasm of cecum: Secondary | ICD-10-CM

## 2012-09-11 DIAGNOSIS — C189 Malignant neoplasm of colon, unspecified: Secondary | ICD-10-CM

## 2012-09-11 MED ORDER — HEPARIN SOD (PORK) LOCK FLUSH 100 UNIT/ML IV SOLN
500.0000 [IU] | Freq: Once | INTRAVENOUS | Status: AC | PRN
  Administered 2012-09-11: 500 [IU]
  Filled 2012-09-11: qty 5

## 2012-09-11 MED ORDER — SODIUM CHLORIDE 0.9 % IJ SOLN
10.0000 mL | INTRAMUSCULAR | Status: DC | PRN
  Administered 2012-09-11: 10 mL
  Filled 2012-09-11: qty 10

## 2012-09-11 NOTE — Progress Notes (Signed)
[facility] [phone]   [phone]  OFFICE PROGRESS NOTE  [name], FNP [address] [place] [address]  DIAGNOSIS:  1) Metastatic colon adenocarcinoma (KRAS mutation) with metastatic lesion to the brain, lung, liver and bone diagnosed in May of 2013.  2) left ventricular mural thrombus incidentally diagnosed on recent CT scan of the chest on 02/28/2012.   PRIOR THERAPY:  1) Status post stereotactic radiosurgery to 2 brain lesions.  2) Systemic chemotherapy with FOLFOX/Avastin status post 11 cycles.   CURRENT THERAPY:  1) continuous infusion 5-FU with Avastin every 2 weeks. First cycle on 06/24/2012. Status post 5 cycles 2) Coumadin 5 mg by mouth daily for the recently diagnosed left ventricular mural thrombus.  INTERVAL HISTORY: Joseph Rowland 79 y.o. male returns to the clinic today for followup visit accompanied his wife. He tolerated the Marinol without any issues with hiccups. He is actually managed to gain 4 pounds with improvement in his appetite. The patient and his wife request a refill for the Marinol. He voiced no other specific complaints today. He continues to tolerate his systemic chemotherapy with continuous infusion 5-FU and Avastin without difficulty. He denied any bleeding or bruising issues.  He continues to have some fatigue.  He denied having any significant nausea or vomiting, no fever or chills. He denied having any significant chest pain, shortness breath, cough or hemoptysis. His right reports an improvement in his anxiety/agitation with the Ativan  MEDICAL HISTORY: Past Medical History  Diagnosis Date  . Lung mass   . Liver cancer   . metastatic adenocarcinoma 11/14/11    liver bx=metastatic adenocarcino57ma with tumor necrosis kras  mutation and involving brain,lung,and bones  . Brain cancer     lesions in occipital lobes,b/l suspicious mets  . Lung cancer     b/l multifocal pulmonary mets  .  Hypertension   . Anemia   . History of radiation therapy 12/07/11    SRS single fraction palliative 2 intracranial lesions  . History of chemotherapy 01/07/12    FOLFAX/AVASTIN s/p 3 cycles   . Mitral regurgitation   . Dilated cardiomyopathy   . Shortness of breath     on exertion  . Chemotherapy induced neutropenia 05/20/2012    ALLERGIES:  has No Known Allergies.  MEDICATIONS:  Current Outpatient Prescriptions  Medication Sig Dispense Refill  . aspirin EC 81 MG tablet Take 81 mg by mouth daily.      . carvedilol (COREG) 3.125 MG tablet       . cholecalciferol (VITAMIN D) 1000 UNITS tablet Take 1,000 Units by mouth daily.      . digoxin (LANOXIN) 0.125 MG tablet       . dronabinol (MARINOL) 2.5 MG capsule Take 1 capsule (2.5 mg total) by mouth 2 (two) times daily before a meal.  60 capsule  0  . lidocaine-prilocaine (EMLA) cream Apply 1 application topically as needed. For port-a-cath access.      Marland Kitchen lisinopril (PRINIVIL,ZESTRIL) 5 MG tablet       . LORazepam (ATIVAN) 0.5 MG tablet Take 1 tablet by mouth at bedtime as needed for rest and anxiety  30 tablet  0  . PRESCRIPTION MEDICATION Inject into the vein every 14 (fourteen) days. Avastin, Leucovorin, Adrucil, Eloxatin      . prochlorperazine (COMPAZINE) 10 MG tablet       . vitamin B-12 (CYANOCOBALAMIN) 250 MCG tablet Take 250 mcg by mouth daily.  No current facility-administered medications for this visit.    SURGICAL HISTORY:  Past Surgical History  Procedure Laterality Date  . Hernia repair    . Right port a cath      power port right subclavian  . Colonoscopy  12/06/2011    Procedure: COLONOSCOPY;  Surgeon: Louis Meckel, MD;  Location: [facility];  Service: Endoscopy;  Laterality: N/A;  . Colonoscopy  12/06/2011    Procedure: COLONOSCOPY;  Surgeon: Louis Meckel, MD;  Location: [facility];  Service: Endoscopy;  Laterality: N/A;    REVIEW OF SYSTEMS:  A comprehensive review of systems was negative except  for: Constitutional: positive for anorexia and fatigue   PHYSICAL EXAMINATION: General appearance: alert, cooperative and no distress Head: Normocephalic, without obvious abnormality, atraumatic Neck: no adenopathy Lymph nodes: Cervical, supraclavicular, and axillary nodes normal. Resp: clear to auscultation bilaterally Cardio: regular rate and rhythm, S1, S2 normal, no murmur, click, rub or gallop GI: soft, non-tender; bowel sounds normal; no masses,  no organomegaly Extremities: extremities normal, atraumatic, no cyanosis or edema Neurologic: Alert and oriented X 3, normal strength and tone. Normal symmetric reflexes. Normal coordination and gait  ECOG PERFORMANCE STATUS: 2 - Symptomatic, <50% confined to bed  Blood pressure 144/66, pulse 62, temperature 97.9 F (36.6 C), temperature source Oral, resp. rate 18, height 5\' 11"  (1.803 m), weight 134 lb 9.6 oz (61.054 kg).  LABORATORY DATA: Lab Results  Component Value Date   WBC 10.0 09/09/2012   HGB 14.2 09/09/2012   HCT 42.8 09/09/2012   MCV 99.1* 09/09/2012   PLT 187 09/09/2012      Chemistry      Component Value Date/Time   NA 138 09/09/2012 1115   NA 134* 02/28/2012 1638   K 4.1 09/09/2012 1115   K 3.9 02/28/2012 1638   CL 104 09/09/2012 1115   CL 100 02/28/2012 1638   CO2 27 09/09/2012 1115   CO2 27 02/28/2012 1638   BUN 24.8 09/09/2012 1115   BUN 11 02/28/2012 1638   CREATININE 0.7 09/09/2012 1115   CREATININE 0.90 02/28/2012 1638      Component Value Date/Time   CALCIUM 9.6 09/09/2012 1115   CALCIUM 9.2 02/28/2012 1638   ALKPHOS 317* 09/09/2012 1115   ALKPHOS 153* 02/04/2012 1034   AST 74* 09/09/2012 1115   AST 36 02/04/2012 1034   ALT 45 09/09/2012 1115   ALT 22 02/04/2012 1034   BILITOT 0.71 09/09/2012 1115   BILITOT 0.3 02/04/2012 1034       RADIOGRAPHIC STUDIES: Mr Laqueta Jean WU Contrast  2012-07-12  *RADIOLOGY REPORT*  Clinical Data: 74-month restaging stereotactic radiation surgery. Metastatic adenocarcinoma of the colon.   MRI HEAD WITHOUT AND WITH CONTRAST  Technique:  Multiplanar, multiecho pulse sequences of the brain and surrounding structures were obtained according to standard protocol without and with intravenous contrast  Contrast: 13mL MULTIHANCE GADOBENATE DIMEGLUMINE 529 MG/ML IV SOLN  Comparison: Three prior exams most recent 03/21/2012.  Findings: Tiny area of enhancement right occipital lobe unchanged.  Interval clearing of areas of enhancement involving the medial aspect of the right parietal lobe.  No new intracranial lesion is noted.  Slight decrease in thickness of the right palatine tonsil lesion.  Dural thickening unchanged.  Interval development of right hemispheric loculated subdural collection with maximal thickness of 6.7 mm with slight flattening of the adjacent brain.  No acute infarct.  No intracranial hemorrhage.  Mild small vessel disease type changes.  Major intracranial vascular structures are patent.  Global atrophy without hydrocephalus. Prominent cerebrospinal fluid spaces posterior fossa greater on the left unchanged.  Paranasal sinus mucosal thickening/partial opacification most notable ethmoid sinus air cells.  Transverse ligament hypertrophy.  IMPRESSION: Tiny area of enhancement right occipital lobe unchanged.  Interval clearing of areas of enhancement involving the medial aspect of the right parietal lobe.  No new intracranial lesion is noted.  Slight decrease in thickness of the right palatine tonsil lesion.  Dural thickening unchanged.  Interval development of right hemispheric loculated subdural collection with maximal thickness of 6.7 mm with slight flattening of the adjacent brain. This may represent a subdural hygroma (secondary consideration is a chronic subdural hematoma).  This has been made a PRA call report utilizing dashboard call feature.   Original Report Authenticated By: Lacy Duverney, M.D.     ASSESSMENT/PLAN: This is a very pleasant with metastatic colon adenocarcinoma  currently on systemic chemotherapy with 5-FU and Avastin status post 3 cycles. He is tolerating his current chemotherapy fairly well with no significant adverse effects. Patient was discussed with Dr. Arbutus Ped. He'll proceed with cycle #6 today as scheduled. He'll followup with Dr. Arbutus Ped in 2 weeks with restaging CT scan of the chest, abdomen and pelvis with contrast to reevaluate his disease. He is given a prescription for Marinol 2.5 mg by mouth twice daily for appetite stimulation total of 60 with no refill.   Laural Benes, Guhan Bruington E, PA-C   All questions were answered. The patient knows to call the clinic with any problems, questions or concerns. We can certainly see the patient much sooner if necessary.  I spent 20 minutes counseling the patient face to face. The total time spent in the appointment was 30 minutes.

## 2012-09-16 ENCOUNTER — Ambulatory Visit (HOSPITAL_COMMUNITY)
Admission: RE | Admit: 2012-09-16 | Discharge: 2012-09-16 | Disposition: A | Discharge status: Home / Self Care | Payer: Medicare Other | Source: Ambulatory Visit | Attending: Physician Assistant | Admitting: Physician Assistant

## 2012-09-16 ENCOUNTER — Encounter (HOSPITAL_COMMUNITY): Payer: Self-pay

## 2012-09-16 DIAGNOSIS — N281 Cyst of kidney, acquired: Secondary | ICD-10-CM | POA: Insufficient documentation

## 2012-09-16 DIAGNOSIS — Z79899 Other long term (current) drug therapy: Secondary | ICD-10-CM | POA: Insufficient documentation

## 2012-09-16 DIAGNOSIS — K639 Disease of intestine, unspecified: Secondary | ICD-10-CM | POA: Insufficient documentation

## 2012-09-16 DIAGNOSIS — C189 Malignant neoplasm of colon, unspecified: Secondary | ICD-10-CM | POA: Insufficient documentation

## 2012-09-16 DIAGNOSIS — C787 Secondary malignant neoplasm of liver and intrahepatic bile duct: Secondary | ICD-10-CM | POA: Insufficient documentation

## 2012-09-16 DIAGNOSIS — C7800 Secondary malignant neoplasm of unspecified lung: Secondary | ICD-10-CM | POA: Insufficient documentation

## 2012-09-16 MED ORDER — IOHEXOL 300 MG/ML  SOLN
100.0000 mL | Freq: Once | INTRAMUSCULAR | Status: AC | PRN
  Administered 2012-09-16: 100 mL via INTRAVENOUS

## 2012-09-17 ENCOUNTER — Telehealth: Payer: Self-pay | Admitting: *Deleted

## 2012-09-17 NOTE — Telephone Encounter (Signed)
Pt's wife called stating that pt has rx for ativan 0.5mg  but sometimes he feels he needs more.  Per Dr Donnald Garre, okay to occasionally take 2 tablets. Pt's wife verbalized understanding.  SLJ

## 2012-09-18 ENCOUNTER — Telehealth: Payer: Self-pay | Admitting: Internal Medicine

## 2012-09-19 ENCOUNTER — Ambulatory Visit
Admission: RE | Admit: 2012-09-19 | Discharge: 2012-09-19 | Disposition: A | Discharge status: Home / Self Care | Payer: Medicare Other | Source: Ambulatory Visit | Attending: Radiation Oncology | Admitting: Radiation Oncology

## 2012-09-19 DIAGNOSIS — C7931 Secondary malignant neoplasm of brain: Secondary | ICD-10-CM

## 2012-09-19 MED ORDER — GADOBENATE DIMEGLUMINE 529 MG/ML IV SOLN
12.0000 mL | Freq: Once | INTRAVENOUS | Status: AC | PRN
  Administered 2012-09-19: 12 mL via INTRAVENOUS

## 2012-09-23 ENCOUNTER — Ambulatory Visit (HOSPITAL_BASED_OUTPATIENT_CLINIC_OR_DEPARTMENT_OTHER): Payer: Medicare Other | Admitting: Internal Medicine

## 2012-09-23 ENCOUNTER — Ambulatory Visit: Payer: Medicare Other

## 2012-09-23 ENCOUNTER — Other Ambulatory Visit (HOSPITAL_BASED_OUTPATIENT_CLINIC_OR_DEPARTMENT_OTHER): Payer: Medicare Other | Admitting: Lab

## 2012-09-23 ENCOUNTER — Encounter: Payer: Self-pay | Admitting: Internal Medicine

## 2012-09-23 ENCOUNTER — Other Ambulatory Visit: Payer: Medicare Other | Admitting: Lab

## 2012-09-23 VITALS — BP 155/71 | HR 74 | Temp 97.6°F | Resp 20 | Ht 71.0 in | Wt 132.7 lb

## 2012-09-23 DIAGNOSIS — C189 Malignant neoplasm of colon, unspecified: Secondary | ICD-10-CM

## 2012-09-23 LAB — CBC WITH DIFFERENTIAL/PLATELET
Basophils Absolute: 0 10*3/uL (ref 0.0–0.1)
EOS%: 0.6 % (ref 0.0–7.0)
HGB: 14.6 g/dL (ref 13.0–17.1)
LYMPH%: 9.8 % — ABNORMAL LOW (ref 14.0–49.0)
MCH: 33.6 pg — ABNORMAL HIGH (ref 27.2–33.4)
MCV: 99.5 fL — ABNORMAL HIGH (ref 79.3–98.0)
MONO%: 12.3 % (ref 0.0–14.0)
NEUT%: 77.1 % — ABNORMAL HIGH (ref 39.0–75.0)
Platelets: 221 10*3/uL (ref 140–400)
RDW: 16.5 % — ABNORMAL HIGH (ref 11.0–14.6)

## 2012-09-23 NOTE — Progress Notes (Signed)
[facility] [phone]   Fax:([phone]) [phone]  OFFICE PROGRESS NOTE  Joseph Pierini, FNP [address] [place] [address]  DIAGNOSIS:  1) Metastatic colon adenocarcinoma (KRAS mutation) with metastatic lesion to the brain, lung, liver and bone diagnosed in May of 2013.  2) left ventricular mural thrombus incidentally diagnosed on recent CT scan of the chest on 02/28/2012.   PRIOR THERAPY:  1) Status post stereotactic radiosurgery to 2 brain lesions.  2) Systemic chemotherapy with FOLFOX/Avastin status post 11 cycles. 3)  continuous infusion 5-FU with Avastin every 2 weeks. First cycle on 06/24/2012. Status post 5 cycles discontinued today secondary to disease progression.  CURRENT THERAPY:  1) the patient will start next week the first cycle of chemotherapy with FOLFIRI/Avastin every 2 weeks. 2) Coumadin 5 mg by mouth daily for the recently diagnosed left ventricular mural thrombus.  INTERVAL HISTORY: Joseph Rowland 79 y.o. male returns to the clinic today for follow up visit accompanied by his wife, and daughter and daughter-in-law. He still complaining of increasing fatigue and weakness as well as poor appetite. He was tried on Marinol again but could not tolerate it with increasing dizziness and confusion. He is still able to ambulate around his house. He denied having any significant chest pain, shortness breath, cough or hemoptysis. He tolerated the previous treatment with 5-FU and Avastin fairly well. The patient had repeat CT scan of the chest, abdomen and pelvis performed recently and he is here for evaluation and discussion of his scan results. He has no nausea or vomiting.  MEDICAL HISTORY: Past Medical History  Diagnosis Date  . Lung mass   . Liver cancer   . metastatic adenocarcinoma 11/14/11    liver bx=metastatic adenocarcino58ma with tumor necrosis kras  mutation and involving brain,lung,and bones  . Brain cancer     lesions  in occipital lobes,b/l suspicious mets  . Lung cancer     b/l multifocal pulmonary mets  . Hypertension   . Anemia   . History of radiation therapy 12/07/11    SRS single fraction palliative 2 intracranial lesions  . History of chemotherapy 01/07/12    FOLFAX/AVASTIN s/p 3 cycles   . Mitral regurgitation   . Dilated cardiomyopathy   . Shortness of breath     on exertion  . Chemotherapy induced neutropenia 05/20/2012    ALLERGIES:  has No Known Allergies.  MEDICATIONS:  Current Outpatient Prescriptions  Medication Sig Dispense Refill  . aspirin EC 81 MG tablet Take 81 mg by mouth daily.      . carvedilol (COREG) 3.125 MG tablet       . cholecalciferol (VITAMIN D) 1000 UNITS tablet Take 1,000 Units by mouth daily.      . digoxin (LANOXIN) 0.125 MG tablet       . lidocaine-prilocaine (EMLA) cream Apply 1 application topically as needed. For port-a-cath access.      Marland Kitchen lisinopril (PRINIVIL,ZESTRIL) 5 MG tablet       . PRESCRIPTION MEDICATION Inject into the vein every 14 (fourteen) days. Avastin, Leucovorin, Adrucil, Eloxatin      . PROAIR HFA 108 (90 BASE) MCG/ACT inhaler       . prochlorperazine (COMPAZINE) 10 MG tablet       . vitamin B-12 (CYANOCOBALAMIN) 250 MCG tablet Take 250 mcg by mouth daily.       No current facility-administered medications for this visit.    SURGICAL HISTORY:  Past Surgical History  Procedure Laterality Date  .  Hernia repair    . Right port a cath      power port right subclavian  . Colonoscopy  12/06/2011    Procedure: COLONOSCOPY;  Surgeon: Louis Meckel, MD;  Location: [facility];  Service: Endoscopy;  Laterality: N/A;  . Colonoscopy  12/06/2011    Procedure: COLONOSCOPY;  Surgeon: Louis Meckel, MD;  Location: [facility];  Service: Endoscopy;  Laterality: N/A;    REVIEW OF SYSTEMS:  A comprehensive review of systems was negative except for: Constitutional: positive for anorexia, fatigue and weight loss Musculoskeletal: positive for  muscle weakness   PHYSICAL EXAMINATION: General appearance: alert, cooperative, fatigued and no distress Head: Normocephalic, without obvious abnormality, atraumatic Neck: no adenopathy Lymph nodes: Cervical, supraclavicular, and axillary nodes normal. Resp: clear to auscultation bilaterally Cardio: regular rate and rhythm, S1, S2 normal, no murmur, click, rub or gallop GI: soft, non-tender; bowel sounds normal; no masses,  no organomegaly Extremities: extremities normal, atraumatic, no cyanosis or edema Neurologic: Grossly normal  ECOG PERFORMANCE STATUS: 2 - Symptomatic, <50% confined to bed  Blood pressure 155/71, pulse 74, temperature 97.6 F (36.4 C), temperature source Oral, resp. rate 20, height 5\' 11"  (1.803 m), weight 132 lb 11.2 oz (60.192 kg).  LABORATORY DATA: Lab Results  Component Value Date   WBC 10.8* 09/23/2012   HGB 14.6 09/23/2012   HCT 43.3 09/23/2012   MCV 99.5* 09/23/2012   PLT 221 09/23/2012      Chemistry      Component Value Date/Time   NA 138 09/09/2012 1115   NA 134* 02/28/2012 1638   K 4.1 09/09/2012 1115   K 3.9 02/28/2012 1638   CL 104 09/09/2012 1115   CL 100 02/28/2012 1638   CO2 27 09/09/2012 1115   CO2 27 02/28/2012 1638   BUN 24.8 09/09/2012 1115   BUN 11 02/28/2012 1638   CREATININE 0.7 09/09/2012 1115   CREATININE 0.90 02/28/2012 1638      Component Value Date/Time   CALCIUM 9.6 09/09/2012 1115   CALCIUM 9.2 02/28/2012 1638   ALKPHOS 317* 09/09/2012 1115   ALKPHOS 153* 02/04/2012 1034   AST 74* 09/09/2012 1115   AST 36 02/04/2012 1034   ALT 45 09/09/2012 1115   ALT 22 02/04/2012 1034   BILITOT 0.71 09/09/2012 1115   BILITOT 0.3 02/04/2012 1034       RADIOGRAPHIC STUDIES: Ct Chest W Contrast  09/16/2012  *RADIOLOGY REPORT*  Clinical Data:  Metastatic colon cancer diagnosed March 2013. Chemotherapy ongoing.  CT CHEST, ABDOMEN AND PELVIS WITH CONTRAST  Technique:  Multidetector CT imaging of the chest, abdomen and pelvis was performed following the  standard protocol during bolus administration of intravenous contrast.  Contrast: OMNIPAQUE IOHEXOL 300 MG/ML  SOLN  Comparison:  CT 06/03/2012  CT CHEST  Findings:  Port in the right anterior chest wall.  No axillary or supraclavicular lymphadenopathy.  No mediastinal or hilar lymphadenopathy.  No pericardial fluid.  Esophagus is normal.  Again demonstrated e large pulmonary nodule metastasis.  Exemplary lesion in left upper lobe measures 16 mm increased from 14 mm on prior (image 26).  The lesion in the peripheral of the left lower lobe measures 27 mm (image 33) increase in 22 mm.  The lesion in the right lower lobe measures 25 mm not changed from 25r.  Small lesions are also increased.  IMPRESSION:  Mild progression of pulmonary metastasis with increase in size of the large and small pulmonary nodules.  CT ABDOMEN AND PELVIS  Findings:  Widespread hepatic metastasis again demonstrated. Exemplary lesion in the central liver measures 4.3 x 2.8 cm increased from 3.5 x 2.3 cm on prior.  Lesion inferior and posterior in the right hepatic lobe measures 4.9 x 4.1 cm compared to 4.5 x 3.4 cm on prior for mild increase.  No new hepatic lesions are identified.  The gallbladder is contracted.  The pancreas, spleen, adrenal glands, and kidneys are unchanged.  Simple cyst of the right kidney demonstrated.  Abdominal aorta normal caliber.  There is thickening of the gastric mucosa  in the cardiac region.  Small bowel and colon demonstrate no obstruction.  There is a omental studding anterior to the right hepatic margin which is increased slightly from prior. Right pericolic gutter adjacent to the cecum is similar to prior.  Lesion within the cecum is again demonstrated measuring 5.5 x 5.3 cm.  In the pelvis, bladder prostate gland normal.  No pelvic lymphadenopathy. Lytic lesion at L5 is stable.  IMPRESSION:  1.  Interval increase in size of hepatic metastasis. 2.  Stable cecal mass.  3.   Mild interval increase in  peritoneal / omental disease along the anterior margin of the liver. 4.  Thickened gastric mucosa within the cardiac region. Differential includes gastritis versus neoplasm or non distention.   Original Report Authenticated By: Genevive Bi, M.D.    Mr Laqueta Jean Wo Contrast  09/19/2012  *RADIOLOGY REPORT*  Clinical Data: S R S restaging, 10 months.  Metastatic adenocarcinoma of the colon.  MRI HEAD WITHOUT AND WITH CONTRAST  Technique:  Multiplanar, multiecho pulse sequences of the brain and surrounding structures were obtained according to standard protocol without and with intravenous contrast  Contrast: 12mL MULTIHANCE GADOBENATE DIMEGLUMINE 529 MG/ML IV SOLN  Comparison: Most recent 06/27/2012.  Also 03/21/2012.  Findings: Continued improvement on the in comparison with most recent from prior scans.  Image numbers below refer to post contrast T1-weighted series 10.  A single 1 mm enhancing lesion right occipital lobe, image 55, remains the same or slightly smaller.  Single focus of enhancement right parietal gray-white junction remains stable to slightly improved, image 125.  Advanced atrophy with chronic microvascular ischemic changes are stable.  5 mm extra-axial CSF collection on the right, consistent with hygroma, stable.  Mild pannus surrounds the odontoid.  IMPRESSION: No evidence for progressive disease. Stable to slightly decreased intracranial enhancement as described.   Original Report Authenticated By: Davonna Belling, M.D.    Ct Abdomen Pelvis W Contrast  09/16/2012  *RADIOLOGY REPORT*  Clinical Data:  Metastatic colon cancer diagnosed March 2013. Chemotherapy ongoing.  CT CHEST, ABDOMEN AND PELVIS WITH CONTRAST  Technique:  Multidetector CT imaging of the chest, abdomen and pelvis was performed following the standard protocol during bolus administration of intravenous contrast.  Contrast: OMNIPAQUE IOHEXOL 300 MG/ML  SOLN  Comparison:  CT 06/03/2012  CT CHEST  Findings:  Port in the right  anterior chest wall.  No axillary or supraclavicular lymphadenopathy.  No mediastinal or hilar lymphadenopathy.  No pericardial fluid.  Esophagus is normal.  Again demonstrated e large pulmonary nodule metastasis.  Exemplary lesion in left upper lobe measures 16 mm increased from 14 mm on prior (image 26).  The lesion in the peripheral of the left lower lobe measures 27 mm (image 33) increase in 22 mm.  The lesion in the right lower lobe measures 25 mm not changed from 25r.  Small lesions are also increased.  IMPRESSION:  Mild progression of pulmonary metastasis  with increase in size of the large and small pulmonary nodules.  CT ABDOMEN AND PELVIS  Findings:  Widespread hepatic metastasis again demonstrated. Exemplary lesion in the central liver measures 4.3 x 2.8 cm increased from 3.5 x 2.3 cm on prior.  Lesion inferior and posterior in the right hepatic lobe measures 4.9 x 4.1 cm compared to 4.5 x 3.4 cm on prior for mild increase.  No new hepatic lesions are identified.  The gallbladder is contracted.  The pancreas, spleen, adrenal glands, and kidneys are unchanged.  Simple cyst of the right kidney demonstrated.  Abdominal aorta normal caliber.  There is thickening of the gastric mucosa  in the cardiac region.  Small bowel and colon demonstrate no obstruction.  There is a omental studding anterior to the right hepatic margin which is increased slightly from prior. Right pericolic gutter adjacent to the cecum is similar to prior.  Lesion within the cecum is again demonstrated measuring 5.5 x 5.3 cm.  In the pelvis, bladder prostate gland normal.  No pelvic lymphadenopathy. Lytic lesion at L5 is stable.  IMPRESSION:  1.  Interval increase in size of hepatic metastasis. 2.  Stable cecal mass.  3.   Mild interval increase in peritoneal / omental disease along the anterior margin of the liver. 4.  Thickened gastric mucosa within the cardiac region. Differential includes gastritis versus neoplasm or non distention.    Original Report Authenticated By: Genevive Bi, M.D.     ASSESSMENT: this is a very pleasant 79 years old white male with metastatic colon adenocarcinoma with K RAS mutation. She is status post chemotherapy with FOLFOX/Avastin followed by maintenance 5-FU and Avastin. Unfortunately the CT scan showed evidence for disease progression.  PLAN: I had a lengthy discussion with the patient and his family today about the scan results, prognosis and treatment options. I gave the patient the option of palliative care and hospice referral versus consideration of chemotherapy with FOLFIRI plus Avastin. I discussed with the patient the adverse effects of the chemotherapy including but not limited to alopecia, myelosuppression, nausea and vomiting, peripheral neuropathy, liver or renal dysfunction. The patient would like to take some time to think about his options but he was interested in scheduling his chemotherapy for next week in case he decided to proceed with that. I would see him back for follow up visit in 3 weeks for reevaluation and management any adverse of the chemotherapy He was advised to call immediately if he has any concerning symptoms in the interval. All questions were answered. The patient knows to call the clinic with any problems, questions or concerns. We can certainly see the patient much sooner if necessary.  I spent 15 minutes counseling the patient face to face. The total time spent in the appointment was 25 minutes.

## 2012-09-23 NOTE — Patient Instructions (Signed)
Your scan showed evidence for disease progression. We discussed treatment options including treatment with FOLFIRI/Avastin. Follow up visit in 3 weeks.

## 2012-09-24 ENCOUNTER — Telehealth: Payer: Self-pay | Admitting: *Deleted

## 2012-09-24 NOTE — Telephone Encounter (Signed)
Lm informing pt about their appt scheduled for 09/30/12. I also made pt aware that I will mail them cal.

## 2012-09-24 NOTE — Telephone Encounter (Signed)
I will not mail out schedule until i get a time slot for 4/29 from [facility].

## 2012-09-24 NOTE — Telephone Encounter (Signed)
Per staff phone call and POF I have schedueld appts.  JMW  

## 2012-09-26 ENCOUNTER — Other Ambulatory Visit: Payer: Self-pay | Admitting: *Deleted

## 2012-09-26 DIAGNOSIS — C187 Malignant neoplasm of sigmoid colon: Secondary | ICD-10-CM

## 2012-09-26 MED ORDER — LORAZEPAM 0.5 MG PO TABS: ORAL_TABLET | ORAL | Status: DC

## 2012-09-29 ENCOUNTER — Telehealth: Payer: Self-pay | Admitting: Medical Oncology

## 2012-09-29 ENCOUNTER — Telehealth: Payer: Self-pay | Admitting: Internal Medicine

## 2012-09-29 ENCOUNTER — Other Ambulatory Visit: Payer: Self-pay | Admitting: Medical Oncology

## 2012-09-29 NOTE — Telephone Encounter (Signed)
Pt requests to skip this week because he wants to feel better with new great granddaughter ( named after him) coming for Easter . Per Dr Arbutus Ped ok-wife notifed.

## 2012-09-30 ENCOUNTER — Ambulatory Visit: Payer: Medicare Other

## 2012-09-30 ENCOUNTER — Other Ambulatory Visit: Payer: Medicare Other | Admitting: Lab

## 2012-10-01 ENCOUNTER — Telehealth: Payer: Self-pay | Admitting: *Deleted

## 2012-10-01 ENCOUNTER — Telehealth: Payer: Self-pay | Admitting: Internal Medicine

## 2012-10-01 NOTE — Telephone Encounter (Signed)
Per staff message and POF I have scheduled appts.  JMW  

## 2012-10-01 NOTE — Telephone Encounter (Signed)
lmonvm for pt re next appt for 4/22 @ 10:15am and mailed schedule. Per 4/14 pof cx lb/tx tomorrow he will keep appts next wk. appts for next wk were for lb/IVF - no chemo and no f/u appt. Checked w/desk nurse to let her know appts for next wk were lb/IVF and to see if the appts are supposed to be lb/tx. Per desk nurse appt for next wk is suppose to be lb/tx. appts for next wk adjusted and scheduled as lb/tx. All other appts were pushed back 1wk due to 1st tx has been delayed from 4/15 to 4/22. The changes have been confirmed w/desk nurse.

## 2012-10-03 ENCOUNTER — Other Ambulatory Visit: Payer: Medicare Other

## 2012-10-06 ENCOUNTER — Telehealth: Payer: Self-pay | Admitting: *Deleted

## 2012-10-06 DIAGNOSIS — F411 Generalized anxiety disorder: Secondary | ICD-10-CM

## 2012-10-06 MED ORDER — ALPRAZOLAM 0.25 MG PO TABS: 0.2500 mg | ORAL_TABLET | Freq: Three times a day (TID) | ORAL | Status: DC | PRN

## 2012-10-06 NOTE — Telephone Encounter (Signed)
Pt's wife called stating that lorazepam knocks pt out and wants him to take something different.  Per Dr Donnald Garre, okay to give 0.25 xanax.  SLJ

## 2012-10-07 ENCOUNTER — Other Ambulatory Visit: Payer: Self-pay | Admitting: *Deleted

## 2012-10-07 ENCOUNTER — Other Ambulatory Visit (HOSPITAL_BASED_OUTPATIENT_CLINIC_OR_DEPARTMENT_OTHER): Payer: Medicare Other | Admitting: Lab

## 2012-10-07 ENCOUNTER — Ambulatory Visit (HOSPITAL_BASED_OUTPATIENT_CLINIC_OR_DEPARTMENT_OTHER): Payer: Medicare Other

## 2012-10-07 VITALS — BP 155/82 | HR 60 | Temp 97.6°F | Resp 17

## 2012-10-07 DIAGNOSIS — C189 Malignant neoplasm of colon, unspecified: Secondary | ICD-10-CM

## 2012-10-07 DIAGNOSIS — C180 Malignant neoplasm of cecum: Secondary | ICD-10-CM

## 2012-10-07 DIAGNOSIS — Z5111 Encounter for antineoplastic chemotherapy: Secondary | ICD-10-CM

## 2012-10-07 DIAGNOSIS — C787 Secondary malignant neoplasm of liver and intrahepatic bile duct: Secondary | ICD-10-CM

## 2012-10-07 DIAGNOSIS — Z5112 Encounter for antineoplastic immunotherapy: Secondary | ICD-10-CM

## 2012-10-07 LAB — COMPREHENSIVE METABOLIC PANEL (CC13)
Albumin: 1.8 g/dL — ABNORMAL LOW (ref 3.5–5.0)
Alkaline Phosphatase: 422 U/L — ABNORMAL HIGH (ref 40–150)
BUN: 26.9 mg/dL — ABNORMAL HIGH (ref 7.0–26.0)
CO2: 27 mEq/L (ref 22–29)
Glucose: 123 mg/dl — ABNORMAL HIGH (ref 70–99)
Total Bilirubin: 2.3 mg/dL — ABNORMAL HIGH (ref 0.20–1.20)
Total Protein: 7.3 g/dL (ref 6.4–8.3)

## 2012-10-07 LAB — CBC WITH DIFFERENTIAL/PLATELET
BASO%: 0.2 % (ref 0.0–2.0)
Basophils Absolute: 0 10*3/uL (ref 0.0–0.1)
EOS%: 1.4 % (ref 0.0–7.0)
HCT: 43.1 % (ref 38.4–49.9)
HGB: 14.4 g/dL (ref 13.0–17.1)
LYMPH%: 8.6 % — ABNORMAL LOW (ref 14.0–49.0)
MCH: 33.4 pg (ref 27.2–33.4)
MCHC: 33.4 g/dL (ref 32.0–36.0)
MCV: 100 fL — ABNORMAL HIGH (ref 79.3–98.0)
NEUT%: 78.4 % — ABNORMAL HIGH (ref 39.0–75.0)
Platelets: 253 10*3/uL (ref 140–400)

## 2012-10-07 MED ORDER — SODIUM CHLORIDE 0.9 % IV SOLN
5.0000 mg/kg | Freq: Once | INTRAVENOUS | Status: AC
  Administered 2012-10-07: 300 mg via INTRAVENOUS
  Filled 2012-10-07: qty 12

## 2012-10-07 MED ORDER — FLUOROURACIL CHEMO INJECTION 2.5 GM/50ML
400.0000 mg/m2 | Freq: Once | INTRAVENOUS | Status: AC
  Administered 2012-10-07: 700 mg via INTRAVENOUS
  Filled 2012-10-07: qty 14

## 2012-10-07 MED ORDER — SODIUM CHLORIDE 0.9 % IV SOLN: Freq: Once | INTRAVENOUS | Status: DC

## 2012-10-07 MED ORDER — LEUCOVORIN CALCIUM INJECTION 350 MG
400.0000 mg/m2 | Freq: Once | INTRAMUSCULAR | Status: AC
  Administered 2012-10-07: 700 mg via INTRAVENOUS
  Filled 2012-10-07: qty 35

## 2012-10-07 MED ORDER — SODIUM CHLORIDE 0.9 % IV SOLN
2400.0000 mg/m2 | INTRAVENOUS | Status: AC
  Administered 2012-10-07: 4200 mg via INTRAVENOUS
  Filled 2012-10-07: qty 84

## 2012-10-07 MED ORDER — SODIUM CHLORIDE 0.9 % IJ SOLN
10.0000 mL | INTRAMUSCULAR | Status: DC | PRN
  Filled 2012-10-07: qty 10

## 2012-10-07 MED ORDER — DEXAMETHASONE SODIUM PHOSPHATE 4 MG/ML IJ SOLN
20.0000 mg | Freq: Once | INTRAMUSCULAR | Status: AC
  Administered 2012-10-07: 20 mg via INTRAVENOUS

## 2012-10-07 MED ORDER — ONDANSETRON 16 MG/50ML IVPB (CHCC)
16.0000 mg | Freq: Once | INTRAVENOUS | Status: AC
  Administered 2012-10-07: 16 mg via INTRAVENOUS

## 2012-10-07 MED ORDER — SODIUM CHLORIDE 0.9 % IV SOLN
Freq: Once | INTRAVENOUS | Status: AC
  Administered 2012-10-07: 11:00:00 via INTRAVENOUS

## 2012-10-07 MED ORDER — IRINOTECAN HCL CHEMO INJECTION 100 MG/5ML
180.0000 mg/m2 | Freq: Once | INTRAVENOUS | Status: AC
  Administered 2012-10-07: 310 mg via INTRAVENOUS
  Filled 2012-10-07: qty 15.5

## 2012-10-07 NOTE — Patient Instructions (Addendum)
[facility] Discharge Instructions for Patients Receiving Chemotherapy  Today you received the following chemotherapy agents: irinotecan, leucovorin, 78fu, avastin.  To help prevent nausea and vomiting after your treatment, we encourage you to take your nausea medication.  Take it as often as prescribed.    If you develop nausea and vomiting that is not controlled by your nausea medication, call the clinic. If it is after clinic hours your family physician or the after hours number for the clinic or go to the Emergency Department.   BELOW ARE SYMPTOMS THAT SHOULD BE REPORTED IMMEDIATELY:  *FEVER GREATER THAN 100.5 F  *CHILLS WITH OR WITHOUT FEVER  NAUSEA AND VOMITING THAT IS NOT CONTROLLED WITH YOUR NAUSEA MEDICATION  *UNUSUAL SHORTNESS OF BREATH  *UNUSUAL BRUISING OR BLEEDING  TENDERNESS IN MOUTH AND THROAT WITH OR WITHOUT PRESENCE OF ULCERS  *URINARY PROBLEMS  *BOWEL PROBLEMS  UNUSUAL RASH Items with * indicate a potential emergency and should be followed up as soon as possible.  One of the nurses will contact you 24 hours after your treatment. Please let the nurse know about any problems that you may have experienced. Feel free to call the clinic you have any questions or concerns. The clinic phone number is [phone].   I have been informed and understand all the instructions given to me. I know to contact the clinic, my physician, or go to the Emergency Department if any problems should occur. I do not have any questions at this time, but understand that I may call the clinic during office hours   should I have any questions or need assistance in obtaining follow up care.    __________________________________________  _____________  __________ Signature of Patient or Authorized Representative            Date                   Time    Irinotecan injection/camptosar What is this medicine? IRINOTECAN (ir in oh TEE kan ) is a chemotherapy drug. It is  used to treat colon and rectal cancer. This medicine may be used for other purposes; ask your health care provider or pharmacist if you have questions. What should I tell my health care provider before I take this medicine? They need to know if you have any of these conditions: -blood disorders -dehydration -diarrhea -infection (especially a virus infection such as chickenpox, cold sores, or herpes) -liver disease -low blood counts, like low white cell, platelet, or red cell counts -recent or ongoing radiation therapy -an unusual or allergic reaction to irinotecan, sorbitol, other chemotherapy, other medicines, foods, dyes, or preservatives -pregnant or trying to get pregnant -breast-feeding How should I use this medicine? This drug is given as an infusion into a vein. It is administered in a hospital or clinic by a specially trained health care professional. Talk to your pediatrician regarding the use of this medicine in children. Special care may be needed. Overdosage: If you think you have taken too much of this medicine contact a poison control center or emergency room at once. NOTE: This medicine is only for you. Do not share this medicine with others. What if I miss a dose? It is important not to miss your dose. Call your doctor or health care professional if you are unable to keep an appointment. What may interact with this medicine? Do not take this medicine with any of the following medications: -atazanavir -ketoconazole -St. John's Wort This medicine may also interact  with the following medications: -dexamethasone -diuretics -laxatives -medicines for seizures like carbamazepine, mephobarbital, phenobarbital, phenytoin, primidone -medicines to increase blood counts like filgrastim, pegfilgrastim, sargramostim -prochlorperazine -vaccines This list may not describe all possible interactions. Give your health care provider a list of all the medicines, herbs, non-prescription  drugs, or dietary supplements you use. Also tell them if you smoke, drink alcohol, or use illegal drugs. Some items may interact with your medicine. What should I watch for while using this medicine? Your condition will be monitored carefully while you are receiving this medicine. You will need important blood work done while you are taking this medicine. This drug may make you feel generally unwell. This is not uncommon, as chemotherapy can affect healthy cells as well as cancer cells. Report any side effects. Continue your course of treatment even though you feel ill unless your doctor tells you to stop. In some cases, you may be given additional medicines to help with side effects. Follow all directions for their use. You may get drowsy or dizzy. Do not drive, use machinery, or do anything that needs mental alertness until you know how this medicine affects you. Do not stand or sit up quickly, especially if you are an older patient. This reduces the risk of dizzy or fainting spells. Call your doctor or health care professional for advice if you get a fever, chills or sore throat, or other symptoms of a cold or flu. Do not treat yourself. This drug decreases your body's ability to fight infections. Try to avoid being around people who are sick. This medicine may increase your risk to bruise or bleed. Call your doctor or health care professional if you notice any unusual bleeding. Be careful brushing and flossing your teeth or using a toothpick because you may get an infection or bleed more easily. If you have any dental work done, tell your dentist you are receiving this medicine. Avoid taking products that contain aspirin, acetaminophen, ibuprofen, naproxen, or ketoprofen unless instructed by your doctor. These medicines may hide a fever. Do not become pregnant while taking this medicine. Women should inform their doctor if they wish to become pregnant or think they might be pregnant. There is a  potential for serious side effects to an unborn child. Talk to your health care professional or pharmacist for more information. Do not breast-feed an infant while taking this medicine. What side effects may I notice from receiving this medicine? Side effects that you should report to your doctor or health care professional as soon as possible: -allergic reactions like skin rash, itching or hives, swelling of the face, lips, or tongue -low blood counts - this medicine may decrease the number of white blood cells, red blood cells and platelets. You may be at increased risk for infections and bleeding. -signs of infection - fever or chills, cough, sore throat, pain or difficulty passing urine -signs of decreased platelets or bleeding - bruising, pinpoint red spots on the skin, black, tarry stools, blood in the urine -signs of decreased red blood cells - unusually weak or tired, fainting spells, lightheadedness -breathing problems -chest pain -diarrhea -feeling faint or lightheaded, falls -flushing, runny nose, sweating during infusion -mouth sores or pain -pain, swelling, redness or irritation where injected -pain, swelling, warmth in the leg -pain, tingling, numbness in the hands or feet -problems with balance, talking, walking -stomach cramps, pain -trouble passing urine or change in the amount of urine -vomiting as to be unable to hold down drinks or food -yellowing  of the eyes or skin Side effects that usually do not require medical attention (report to your doctor or health care professional if they continue or are bothersome): -constipation -hair loss -headache -loss of appetite -nausea, vomiting -stomach upset This list may not describe all possible side effects. Call your doctor for medical advice about side effects. You may report side effects to FDA at 1-800-FDA-1088. Where should I keep my medicine? This drug is given in a hospital or clinic and will not be stored at  home. NOTE: This sheet is a summary. It may not cover all possible information. If you have questions about this medicine, talk to your doctor, pharmacist, or health care provider.  2012, [employer]. (10/21/2007 4:29:12 PM)    __________________________________________ Nurse's Signature

## 2012-10-08 ENCOUNTER — Telehealth: Payer: Self-pay | Admitting: *Deleted

## 2012-10-08 NOTE — Telephone Encounter (Signed)
Message copied by Augusto Garbe on Wed Oct 08, 2012  3:23 PM ------      Message from: Lorri Frederick      Created: Tue Oct 07, 2012 11:54 AM      Regarding: New chemo       Irinotecan       ------

## 2012-10-08 NOTE — Telephone Encounter (Signed)
Asked for Braxon and [name] spoke with me saying he is resting.  Denies any diarrhea but has imodium on hand if needed. He is eating and drinking well.  No complaints or side effects.  Report he is flushed but he gets flushed after every treatment.  Explained the dexamethasone causes the flushed appearance.  Would like me to tell Dr. Arbutus Ped is is doing well.

## 2012-10-09 ENCOUNTER — Ambulatory Visit (HOSPITAL_BASED_OUTPATIENT_CLINIC_OR_DEPARTMENT_OTHER): Payer: Medicare Other

## 2012-10-09 VITALS — BP 148/76 | HR 81 | Temp 97.5°F

## 2012-10-09 DIAGNOSIS — C180 Malignant neoplasm of cecum: Secondary | ICD-10-CM

## 2012-10-09 DIAGNOSIS — C189 Malignant neoplasm of colon, unspecified: Secondary | ICD-10-CM

## 2012-10-09 MED ORDER — SODIUM CHLORIDE 0.9 % IJ SOLN
10.0000 mL | INTRAMUSCULAR | Status: DC | PRN
  Administered 2012-10-09: 10 mL
  Filled 2012-10-09: qty 10

## 2012-10-09 MED ORDER — HEPARIN SOD (PORK) LOCK FLUSH 100 UNIT/ML IV SOLN
500.0000 [IU] | Freq: Once | INTRAVENOUS | Status: AC | PRN
  Administered 2012-10-09: 500 [IU]
  Filled 2012-10-09: qty 5

## 2012-10-10 ENCOUNTER — Telehealth: Payer: Self-pay | Admitting: Medical Oncology

## 2012-10-10 NOTE — Telephone Encounter (Signed)
Per wife pt denies abdominal pain , is passing flatus but has not had a BM in 1 week. He took mag citrate in past for same issue and it helped. I instructed wife to give pt 1/2 bottle mag citrate and give other half if needed. I also instructed her to give him sennakot s bid daily .

## 2012-10-14 ENCOUNTER — Ambulatory Visit: Payer: Medicare Other

## 2012-10-14 ENCOUNTER — Other Ambulatory Visit: Payer: Medicare Other | Admitting: Lab

## 2012-10-14 ENCOUNTER — Ambulatory Visit: Payer: Medicare Other | Admitting: Internal Medicine

## 2012-10-14 ENCOUNTER — Other Ambulatory Visit: Payer: Self-pay | Admitting: Radiation Therapy

## 2012-10-14 ENCOUNTER — Other Ambulatory Visit (HOSPITAL_BASED_OUTPATIENT_CLINIC_OR_DEPARTMENT_OTHER): Payer: Medicare Other

## 2012-10-14 DIAGNOSIS — C189 Malignant neoplasm of colon, unspecified: Secondary | ICD-10-CM

## 2012-10-14 DIAGNOSIS — C180 Malignant neoplasm of cecum: Secondary | ICD-10-CM

## 2012-10-14 DIAGNOSIS — C7931 Secondary malignant neoplasm of brain: Secondary | ICD-10-CM

## 2012-10-14 LAB — CBC WITH DIFFERENTIAL/PLATELET
Basophils Absolute: 0 10*3/uL (ref 0.0–0.1)
Eosinophils Absolute: 0.1 10*3/uL (ref 0.0–0.5)
HGB: 13.1 g/dL (ref 13.0–17.1)
MCV: 101.7 fL — ABNORMAL HIGH (ref 79.3–98.0)
MONO#: 0.1 10*3/uL (ref 0.1–0.9)
MONO%: 1.4 % (ref 0.0–14.0)
NEUT#: 4.3 10*3/uL (ref 1.5–6.5)
Platelets: 149 10*3/uL (ref 140–400)
RBC: 3.85 10*6/uL — ABNORMAL LOW (ref 4.20–5.82)
RDW: 16 % — ABNORMAL HIGH (ref 11.0–14.6)
WBC: 5.2 10*3/uL (ref 4.0–10.3)

## 2012-10-14 LAB — COMPREHENSIVE METABOLIC PANEL (CC13)
Albumin: 1.8 g/dL — ABNORMAL LOW (ref 3.5–5.0)
BUN: 23.9 mg/dL (ref 7.0–26.0)
CO2: 28 mEq/L (ref 22–29)
Calcium: 9.5 mg/dL (ref 8.4–10.4)
Glucose: 133 mg/dl — ABNORMAL HIGH (ref 70–99)
Potassium: 4.4 mEq/L (ref 3.5–5.1)
Sodium: 138 mEq/L (ref 136–145)
Total Protein: 6.8 g/dL (ref 6.4–8.3)

## 2012-10-16 ENCOUNTER — Other Ambulatory Visit: Payer: Self-pay | Admitting: *Deleted

## 2012-10-16 ENCOUNTER — Ambulatory Visit: Payer: Medicare Other | Admitting: Internal Medicine

## 2012-10-16 ENCOUNTER — Other Ambulatory Visit: Payer: Self-pay | Admitting: Medical Oncology

## 2012-10-16 ENCOUNTER — Other Ambulatory Visit: Payer: Medicare Other | Admitting: Lab

## 2012-10-16 DIAGNOSIS — F411 Generalized anxiety disorder: Secondary | ICD-10-CM

## 2012-10-16 MED ORDER — ALPRAZOLAM 0.25 MG PO TABS: 0.2500 mg | ORAL_TABLET | Freq: Three times a day (TID) | ORAL | Status: DC | PRN

## 2012-10-16 NOTE — Telephone Encounter (Signed)
Called to pharmacy earlier.

## 2012-10-21 ENCOUNTER — Ambulatory Visit (HOSPITAL_BASED_OUTPATIENT_CLINIC_OR_DEPARTMENT_OTHER): Payer: Medicare Other | Admitting: Internal Medicine

## 2012-10-21 ENCOUNTER — Telehealth: Payer: Self-pay | Admitting: Internal Medicine

## 2012-10-21 ENCOUNTER — Ambulatory Visit (HOSPITAL_BASED_OUTPATIENT_CLINIC_OR_DEPARTMENT_OTHER): Payer: Medicare Other

## 2012-10-21 ENCOUNTER — Encounter: Payer: Self-pay | Admitting: Internal Medicine

## 2012-10-21 ENCOUNTER — Other Ambulatory Visit (HOSPITAL_BASED_OUTPATIENT_CLINIC_OR_DEPARTMENT_OTHER): Payer: Medicare Other

## 2012-10-21 ENCOUNTER — Other Ambulatory Visit: Payer: Medicare Other | Admitting: Lab

## 2012-10-21 VITALS — BP 135/66 | HR 77 | Temp 97.2°F | Resp 17 | Ht 71.0 in | Wt 141.2 lb

## 2012-10-21 DIAGNOSIS — C189 Malignant neoplasm of colon, unspecified: Secondary | ICD-10-CM

## 2012-10-21 DIAGNOSIS — C7800 Secondary malignant neoplasm of unspecified lung: Secondary | ICD-10-CM

## 2012-10-21 DIAGNOSIS — C180 Malignant neoplasm of cecum: Secondary | ICD-10-CM

## 2012-10-21 DIAGNOSIS — C787 Secondary malignant neoplasm of liver and intrahepatic bile duct: Secondary | ICD-10-CM

## 2012-10-21 DIAGNOSIS — C7951 Secondary malignant neoplasm of bone: Secondary | ICD-10-CM

## 2012-10-21 DIAGNOSIS — Z5112 Encounter for antineoplastic immunotherapy: Secondary | ICD-10-CM

## 2012-10-21 DIAGNOSIS — Z5111 Encounter for antineoplastic chemotherapy: Secondary | ICD-10-CM

## 2012-10-21 DIAGNOSIS — C210 Malignant neoplasm of anus, unspecified: Secondary | ICD-10-CM

## 2012-10-21 LAB — COMPREHENSIVE METABOLIC PANEL (CC13)
ALT: 35 U/L (ref 0–55)
Albumin: 1.8 g/dL — ABNORMAL LOW (ref 3.5–5.0)
CO2: 27 mEq/L (ref 22–29)
Potassium: 4.3 mEq/L (ref 3.5–5.1)
Sodium: 138 mEq/L (ref 136–145)
Total Bilirubin: 1.17 mg/dL (ref 0.20–1.20)
Total Protein: 6.9 g/dL (ref 6.4–8.3)

## 2012-10-21 LAB — CBC WITH DIFFERENTIAL/PLATELET
BASO%: 0.6 % (ref 0.0–2.0)
Eosinophils Absolute: 0.1 10*3/uL (ref 0.0–0.5)
LYMPH%: 30.9 % (ref 14.0–49.0)
MCHC: 33.3 g/dL (ref 32.0–36.0)
MONO#: 0.7 10*3/uL (ref 0.1–0.9)
NEUT#: 1.7 10*3/uL (ref 1.5–6.5)
Platelets: 243 10*3/uL (ref 140–400)
RBC: 4.07 10*6/uL — ABNORMAL LOW (ref 4.20–5.82)
RDW: 17.2 % — ABNORMAL HIGH (ref 11.0–14.6)
WBC: 3.6 10*3/uL — ABNORMAL LOW (ref 4.0–10.3)
lymph#: 1.1 10*3/uL (ref 0.9–3.3)

## 2012-10-21 LAB — UA PROTEIN, DIPSTICK - CHCC: Protein, ur: NEGATIVE mg/dL

## 2012-10-21 MED ORDER — IRINOTECAN HCL CHEMO INJECTION 100 MG/5ML
180.0000 mg/m2 | Freq: Once | INTRAVENOUS | Status: AC
  Administered 2012-10-21: 310 mg via INTRAVENOUS
  Filled 2012-10-21: qty 15.5

## 2012-10-21 MED ORDER — DEXAMETHASONE SODIUM PHOSPHATE 20 MG/5ML IJ SOLN
20.0000 mg | Freq: Once | INTRAMUSCULAR | Status: AC
  Administered 2012-10-21: 20 mg via INTRAVENOUS

## 2012-10-21 MED ORDER — SODIUM CHLORIDE 0.9 % IV SOLN
Freq: Once | INTRAVENOUS | Status: AC
  Administered 2012-10-21: 11:00:00 via INTRAVENOUS

## 2012-10-21 MED ORDER — FLUOROURACIL CHEMO INJECTION 2.5 GM/50ML
400.0000 mg/m2 | Freq: Once | INTRAVENOUS | Status: AC
  Administered 2012-10-21: 700 mg via INTRAVENOUS
  Filled 2012-10-21: qty 14

## 2012-10-21 MED ORDER — ATROPINE SULFATE 1 MG/ML IJ SOLN
0.5000 mg | Freq: Once | INTRAMUSCULAR | Status: AC | PRN
  Administered 2012-10-21: 0.5 mg via INTRAVENOUS

## 2012-10-21 MED ORDER — LEUCOVORIN CALCIUM INJECTION 350 MG
400.0000 mg/m2 | Freq: Once | INTRAVENOUS | Status: AC
  Administered 2012-10-21: 700 mg via INTRAVENOUS
  Filled 2012-10-21: qty 35

## 2012-10-21 MED ORDER — SODIUM CHLORIDE 0.9 % IV SOLN
2400.0000 mg/m2 | INTRAVENOUS | Status: DC
  Administered 2012-10-21: 4200 mg via INTRAVENOUS
  Filled 2012-10-21: qty 84

## 2012-10-21 MED ORDER — ONDANSETRON 16 MG/50ML IVPB (CHCC)
16.0000 mg | Freq: Once | INTRAVENOUS | Status: AC
  Administered 2012-10-21: 16 mg via INTRAVENOUS

## 2012-10-21 MED ORDER — SODIUM CHLORIDE 0.9 % IV SOLN
5.0000 mg/kg | Freq: Once | INTRAVENOUS | Status: AC
  Administered 2012-10-21: 300 mg via INTRAVENOUS
  Filled 2012-10-21: qty 12

## 2012-10-21 NOTE — Patient Instructions (Addendum)
Fluorouracil, 5-FU injection What is this medicine? FLUOROURACIL, 5-FU (flure oh YOOR a sil) is a chemotherapy drug. It slows the growth of cancer cells. This medicine is used to treat many types of cancer like breast cancer, colon or rectal cancer, pancreatic cancer, and stomach cancer. This medicine may be used for other purposes; ask your health care provider or pharmacist if you have questions. What should I tell my health care provider before I take this medicine? They need to know if you have any of these conditions: -blood disorders -dihydropyrimidine dehydrogenase (DPD) deficiency -infection (especially a virus infection such as chickenpox, cold sores, or herpes) -kidney disease -liver disease -malnourished, poor nutrition -recent or ongoing radiation therapy -an unusual or allergic reaction to fluorouracil, other chemotherapy, other medicines, foods, dyes, or preservatives -pregnant or trying to get pregnant -breast-feeding How should I use this medicine? This drug is given as an infusion or injection into a vein. It is administered in a hospital or clinic by a specially trained health care professional. Talk to your pediatrician regarding the use of this medicine in children. Special care may be needed. Overdosage: If you think you have taken too much of this medicine contact a poison control center or emergency room at once. NOTE: This medicine is only for you. Do not share this medicine with others. What if I miss a dose? It is important not to miss your dose. Call your doctor or health care professional if you are unable to keep an appointment. What may interact with this medicine? -allopurinol -cimetidine -dapsone -digoxin -hydroxyurea -leucovorin -levamisole -medicines for seizures like ethotoin, fosphenytoin, phenytoin -medicines to increase blood counts like filgrastim, pegfilgrastim, sargramostim -medicines that treat or prevent blood clots like warfarin,  enoxaparin, and dalteparin -methotrexate -metronidazole -pyrimethamine -some other chemotherapy drugs like busulfan, cisplatin, estramustine, vinblastine -trimethoprim -trimetrexate -vaccines Talk to your doctor or health care professional before taking any of these medicines: -acetaminophen -aspirin -ibuprofen -ketoprofen -naproxen This list may not describe all possible interactions. Give your health care provider a list of all the medicines, herbs, non-prescription drugs, or dietary supplements you use. Also tell them if you smoke, drink alcohol, or use illegal drugs. Some items may interact with your medicine. What should I watch for while using this medicine? Visit your doctor for checks on your progress. This drug may make you feel generally unwell. This is not uncommon, as chemotherapy can affect healthy cells as well as cancer cells. Report any side effects. Continue your course of treatment even though you feel ill unless your doctor tells you to stop. In some cases, you may be given additional medicines to help with side effects. Follow all directions for their use. Call your doctor or health care professional for advice if you get a fever, chills or sore throat, or other symptoms of a cold or flu. Do not treat yourself. This drug decreases your body's ability to fight infections. Try to avoid being around people who are sick. This medicine may increase your risk to bruise or bleed. Call your doctor or health care professional if you notice any unusual bleeding. Be careful brushing and flossing your teeth or using a toothpick because you may get an infection or bleed more easily. If you have any dental work done, tell your dentist you are receiving this medicine. Avoid taking products that contain aspirin, acetaminophen, ibuprofen, naproxen, or ketoprofen unless instructed by your doctor. These medicines may hide a fever. Do not become pregnant while taking this medicine. Women should    inform their doctor if they wish to become pregnant or think they might be pregnant. There is a potential for serious side effects to an unborn child. Talk to your health care professional or pharmacist for more information. Do not breast-feed an infant while taking this medicine. Men should inform their doctor if they wish to father a child. This medicine may lower sperm counts. Do not treat diarrhea with over the counter products. Contact your doctor if you have diarrhea that lasts more than 2 days or if it is severe and watery. This medicine can make you more sensitive to the sun. Keep out of the sun. If you cannot avoid being in the sun, wear protective clothing and use sunscreen. Do not use sun lamps or tanning beds/booths. What side effects may I notice from receiving this medicine? Side effects that you should report to your doctor or health care professional as soon as possible: -allergic reactions like skin rash, itching or hives, swelling of the face, lips, or tongue -low blood counts - this medicine may decrease the number of white blood cells, red blood cells and platelets. You may be at increased risk for infections and bleeding. -signs of infection - fever or chills, cough, sore throat, pain or difficulty passing urine -signs of decreased platelets or bleeding - bruising, pinpoint red spots on the skin, black, tarry stools, blood in the urine -signs of decreased red blood cells - unusually weak or tired, fainting spells, lightheadedness -breathing problems -changes in vision -chest pain -mouth sores -nausea and vomiting -pain, swelling, redness at site where injected -pain, tingling, numbness in the hands or feet -redness, swelling, or sores on hands or feet -stomach pain -unusual bleeding Side effects that usually do not require medical attention (report to your doctor or health care professional if they continue or are bothersome): -changes in finger or toe  nails -diarrhea -dry or itchy skin -hair loss -headache -loss of appetite -sensitivity of eyes to the light -stomach upset -unusually teary eyes This list may not describe all possible side effects. Call your doctor for medical advice about side effects. You may report side effects to FDA at 1-800-FDA-1088. Where should I keep my medicine? This drug is given in a hospital or clinic and will not be stored at home. NOTE: This sheet is a summary. It may not cover all possible information. If you have questions about this medicine, talk to your doctor, pharmacist, or health care provider.  2013, [employer]. (10/08/2007 1:53:16 PM) Irinotecan injection What is this medicine? IRINOTECAN (ir in oh TEE kan ) is a chemotherapy drug. It is used to treat colon and rectal cancer. This medicine may be used for other purposes; ask your health care provider or pharmacist if you have questions. What should I tell my health care provider before I take this medicine? They need to know if you have any of these conditions: -blood disorders -dehydration -diarrhea -infection (especially a virus infection such as chickenpox, cold sores, or herpes) -liver disease -low blood counts, like low white cell, platelet, or red cell counts -recent or ongoing radiation therapy -an unusual or allergic reaction to irinotecan, sorbitol, other chemotherapy, other medicines, foods, dyes, or preservatives -pregnant or trying to get pregnant -breast-feeding How should I use this medicine? This drug is given as an infusion into a vein. It is administered in a hospital or clinic by a specially trained health care professional. Talk to your pediatrician regarding the use of this medicine in children. Special care  may be needed. Overdosage: If you think you have taken too much of this medicine contact a poison control center or emergency room at once. NOTE: This medicine is only for you. Do not share this medicine  with others. What if I miss a dose? It is important not to miss your dose. Call your doctor or health care professional if you are unable to keep an appointment. What may interact with this medicine? Do not take this medicine with any of the following medications: -atazanavir -ketoconazole -St. John's Wort This medicine may also interact with the following medications: -dexamethasone -diuretics -laxatives -medicines for seizures like carbamazepine, mephobarbital, phenobarbital, phenytoin, primidone -medicines to increase blood counts like filgrastim, pegfilgrastim, sargramostim -prochlorperazine -vaccines This list may not describe all possible interactions. Give your health care provider a list of all the medicines, herbs, non-prescription drugs, or dietary supplements you use. Also tell them if you smoke, drink alcohol, or use illegal drugs. Some items may interact with your medicine. What should I watch for while using this medicine? Your condition will be monitored carefully while you are receiving this medicine. You will need important blood work done while you are taking this medicine. This drug may make you feel generally unwell. This is not uncommon, as chemotherapy can affect healthy cells as well as cancer cells. Report any side effects. Continue your course of treatment even though you feel ill unless your doctor tells you to stop. In some cases, you may be given additional medicines to help with side effects. Follow all directions for their use. You may get drowsy or dizzy. Do not drive, use machinery, or do anything that needs mental alertness until you know how this medicine affects you. Do not stand or sit up quickly, especially if you are an older patient. This reduces the risk of dizzy or fainting spells. Call your doctor or health care professional for advice if you get a fever, chills or sore throat, or other symptoms of a cold or flu. Do not treat yourself. This drug  decreases your body's ability to fight infections. Try to avoid being around people who are sick. This medicine may increase your risk to bruise or bleed. Call your doctor or health care professional if you notice any unusual bleeding. Be careful brushing and flossing your teeth or using a toothpick because you may get an infection or bleed more easily. If you have any dental work done, tell your dentist you are receiving this medicine. Avoid taking products that contain aspirin, acetaminophen, ibuprofen, naproxen, or ketoprofen unless instructed by your doctor. These medicines may hide a fever. Do not become pregnant while taking this medicine. Women should inform their doctor if they wish to become pregnant or think they might be pregnant. There is a potential for serious side effects to an unborn child. Talk to your health care professional or pharmacist for more information. Do not breast-feed an infant while taking this medicine. What side effects may I notice from receiving this medicine? Side effects that you should report to your doctor or health care professional as soon as possible: -allergic reactions like skin rash, itching or hives, swelling of the face, lips, or tongue -low blood counts - this medicine may decrease the number of white blood cells, red blood cells and platelets. You may be at increased risk for infections and bleeding. -signs of infection - fever or chills, cough, sore throat, pain or difficulty passing urine -signs of decreased platelets or bleeding - bruising, pinpoint red spots  on the skin, black, tarry stools, blood in the urine -signs of decreased red blood cells - unusually weak or tired, fainting spells, lightheadedness -breathing problems -chest pain -diarrhea -feeling faint or lightheaded, falls -flushing, runny nose, sweating during infusion -mouth sores or pain -pain, swelling, redness or irritation where injected -pain, swelling, warmth in the leg -pain,  tingling, numbness in the hands or feet -problems with balance, talking, walking -stomach cramps, pain -trouble passing urine or change in the amount of urine -vomiting as to be unable to hold down drinks or food -yellowing of the eyes or skin Side effects that usually do not require medical attention (report to your doctor or health care professional if they continue or are bothersome): -constipation -hair loss -headache -loss of appetite -nausea, vomiting -stomach upset This list may not describe all possible side effects. Call your doctor for medical advice about side effects. You may report side effects to FDA at 1-800-FDA-1088. Where should I keep my medicine? This drug is given in a hospital or clinic and will not be stored at home. NOTE: This sheet is a summary. It may not cover all possible information. If you have questions about this medicine, talk to your doctor, pharmacist, or health care provider.  2013, [employer]. (10/21/2007 4:29:12 PM) Leucovorin injection What is this medicine? LEUCOVORIN (loo koe VOR in) is used to prevent or treat the harmful effects of some medicines. This medicine is used to treat anemia caused by a low amount of folic acid in the body. It is also used with 5-fluorouracil (5-FU) to treat colon cancer. This medicine may be used for other purposes; ask your health care provider or pharmacist if you have questions. What should I tell my health care provider before I take this medicine? They need to know if you have any of these conditions: -anemia from low levels of vitamin B-12 in the blood -an unusual or allergic reaction to leucovorin, folic acid, other medicines, foods, dyes, or preservatives -pregnant or trying to get pregnant -breast-feeding How should I use this medicine? This medicine is for injection into a muscle or into a vein. It is given by a health care professional in a hospital or clinic setting. Talk to your pediatrician  regarding the use of this medicine in children. Special care may be needed. Overdosage: If you think you have taken too much of this medicine contact a poison control center or emergency room at once. NOTE: This medicine is only for you. Do not share this medicine with others. What if I miss a dose? This does not apply. What may interact with this medicine? -capecitabine -fluorouracil -phenobarbital -phenytoin -primidone -trimethoprim-sulfamethoxazole This list may not describe all possible interactions. Give your health care provider a list of all the medicines, herbs, non-prescription drugs, or dietary supplements you use. Also tell them if you smoke, drink alcohol, or use illegal drugs. Some items may interact with your medicine. What should I watch for while using this medicine? Your condition will be monitored carefully while you are receiving this medicine. This medicine may increase the side effects of 5-fluorouracil, 5-FU. Tell your doctor or health care professional if you have diarrhea or mouth sores that do not get better or that get worse. What side effects may I notice from receiving this medicine? Side effects that you should report to your doctor or health care professional as soon as possible: -allergic reactions like skin rash, itching or hives, swelling of the face, lips, or tongue -breathing problems -fever,  infection -mouth sores -unusual bleeding or bruising -unusually weak or tired Side effects that usually do not require medical attention (report to your doctor or health care professional if they continue or are bothersome): -constipation or diarrhea -loss of appetite -nausea, vomiting This list may not describe all possible side effects. Call your doctor for medical advice about side effects. You may report side effects to FDA at 1-800-FDA-1088. Where should I keep my medicine? This drug is given in a hospital or clinic and will not be stored at home. NOTE: This  sheet is a summary. It may not cover all possible information. If you have questions about this medicine, talk to your doctor, pharmacist, or health care provider.  2012, [employer]. (12/09/2007 4:50:29 PM)Bevacizumab injection What is this medicine? BEVACIZUMAB (be va SIZ yoo mab) is a chemotherapy drug. It targets a protein found in many cancer cell types, and halts cancer growth. This drug treats many cancers including non-small cell lung cancer, and colon or rectal cancer. It is usually given with other chemotherapy drugs. This medicine may be used for other purposes; ask your health care provider or pharmacist if you have questions. What should I tell my health care provider before I take this medicine? They need to know if you have any of these conditions: -blood clots -heart disease, including heart failure, heart attack, or chest pain (angina) -high blood pressure -infection (especially a virus infection such as chickenpox, cold sores, or herpes) -kidney disease -lung disease -prior chemotherapy with doxorubicin, daunorubicin, epirubicin, or other anthracycline type chemotherapy agents -recent or ongoing radiation therapy -recent surgery -stroke -an unusual or allergic reaction to bevacizumab, hamster proteins, mouse proteins, other medicines, foods, dyes, or preservatives -pregnant or trying to get pregnant -breast-feeding How should I use this medicine? This medicine is for infusion into a vein. It is given by a health care professional in a hospital or clinic setting. Talk to your pediatrician regarding the use of this medicine in children. Special care may be needed. Overdosage: If you think you have taken too much of this medicine contact a poison control center or emergency room at once. NOTE: This medicine is only for you. Do not share this medicine with others. What if I miss a dose? It is important not to miss your dose. Call your doctor or health care  professional if you are unable to keep an appointment. What may interact with this medicine? Interactions are not expected. This list may not describe all possible interactions. Give your health care provider a list of all the medicines, herbs, non-prescription drugs, or dietary supplements you use. Also tell them if you smoke, drink alcohol, or use illegal drugs. Some items may interact with your medicine. What should I watch for while using this medicine? Your condition will be monitored carefully while you are receiving this medicine. You will need important blood work and urine testing done while you are taking this medicine. During your treatment, let your health care professional know if you have any unusual symptoms, such as difficulty breathing. This medicine may rarely cause 'gastrointestinal perforation' (holes in the stomach, intestines or colon), a serious side effect requiring surgery to repair. This medicine should be started at least 28 days following major surgery and the site of the surgery should be totally healed. Check with your doctor before scheduling dental work or surgery while you are receiving this treatment. Talk to your doctor if you have recently had surgery or if you have a wound that has  not healed. Do not become pregnant while taking this medicine. Women should inform their doctor if they wish to become pregnant or think they might be pregnant. There is a potential for serious side effects to an unborn child. Talk to your health care professional or pharmacist for more information. Do not breast-feed an infant while taking this medicine. This medicine has caused ovarian failure in some women. This medicine may interfere with the ability to have a child. You should talk to your doctor or health care professional if you are concerned about your fertility. What side effects may I notice from receiving this medicine? Side effects that you should report to your doctor or health  care professional as soon as possible: -allergic reactions like skin rash, itching or hives, swelling of the face, lips, or tongue -signs of infection - fever or chills, cough, sore throat, pain or trouble passing urine -signs of decreased platelets or bleeding - bruising, pinpoint red spots on the skin, black, tarry stools, nosebleeds, blood in the urine -breathing problems -changes in vision -chest pain -confusion -jaw pain, especially after dental work -mouth sores -seizures -severe abdominal pain -severe headache -sudden numbness or weakness of the face, arm or leg -swelling of legs or ankles -symptoms of a stroke: change in mental awareness, inability to talk or move one side of the body (especially in patients with lung cancer) -trouble passing urine or change in the amount of urine -trouble speaking or understanding -trouble walking, dizziness, loss of balance or coordination Side effects that usually do not require medical attention (report to your doctor or health care professional if they continue or are bothersome): -constipation -diarrhea -dry skin -headache -loss of appetite -nausea, vomiting This list may not describe all possible side effects. Call your doctor for medical advice about side effects. You may report side effects to FDA at 1-800-FDA-1088. Where should I keep my medicine? This drug is given in a hospital or clinic and will not be stored at home. NOTE: This sheet is a summary. It may not cover all possible information. If you have questions about this medicine, talk to your doctor, pharmacist, or health care provider.  2013, [employer]. (05/05/2010 4:25:37 PM)

## 2012-10-21 NOTE — Progress Notes (Signed)
[facility] Telephone:([phone]   [phone]  OFFICE PROGRESS NOTE  [name], FNP [address] [place] [address]  DIAGNOSIS:  1) Metastatic colon adenocarcinoma (KRAS mutation) with metastatic lesion to the brain, lung, liver and bone diagnosed in May of 2013.  2) left ventricular mural thrombus incidentally diagnosed on recent CT scan of the chest on 02/28/2012.   PRIOR THERAPY:  1) Status post stereotactic radiosurgery to 2 brain lesions.  2) Systemic chemotherapy with FOLFOX/Avastin status post 11 cycles.  3) continuous infusion 5-FU with Avastin every 2 weeks. First cycle on 06/24/2012. Status post 5 cycles discontinued today secondary to disease progression.   CURRENT THERAPY:  1) the patient will start next week the first cycle of chemotherapy with FOLFIRI/Avastin every 2 weeks.  2) Coumadin 5 mg by mouth daily for the recently diagnosed left ventricular mural thrombus.  INTERVAL HISTORY: Joseph Rowland 79 y.o. male returns to the clinic today for follow up visit accompanied by his wife. The patient is feeling fine today with no specific complaints except for fatigue and generalized weakness. He also has abdominal distention that has been increasing recently. He denied having any significant weight loss or night sweats. The patient tolerated the first cycle of chemotherapy with FOLFIRI/Avastin fairly well with no significant adverse effects. He denied having any diarrhea, nausea or vomiting.  He has no significant chest pain, shortness breath, cough or hemoptysis.   MEDICAL HISTORY: Past Medical History  Diagnosis Date  . Lung mass   . Liver cancer   . metastatic adenocarcinoma 11/14/11    liver bx=metastatic adenocarcino65ma with tumor necrosis kras  mutation and involving brain,lung,and bones  . Brain cancer     lesions in occipital lobes,b/l suspicious mets  . Lung cancer     b/l multifocal pulmonary mets  .  Hypertension   . Anemia   . History of radiation therapy 12/07/11    SRS single fraction palliative 2 intracranial lesions  . History of chemotherapy 01/07/12    FOLFAX/AVASTIN s/p 3 cycles   . Mitral regurgitation   . Dilated cardiomyopathy   . Shortness of breath     on exertion  . Chemotherapy induced neutropenia 05/20/2012    ALLERGIES:  has No Known Allergies.  MEDICATIONS:  Current Outpatient Prescriptions  Medication Sig Dispense Refill  . ALPRAZolam (XANAX) 0.25 MG tablet Take 1 tablet (0.25 mg total) by mouth 3 (three) times daily as needed for sleep or anxiety.  30 tablet  0  . aspirin EC 81 MG tablet Take 81 mg by mouth daily.      . carvedilol (COREG) 3.125 MG tablet       . cholecalciferol (VITAMIN D) 1000 UNITS tablet Take 1,000 Units by mouth daily.      . digoxin (LANOXIN) 0.125 MG tablet       . lidocaine-prilocaine (EMLA) cream Apply 1 application topically as needed. For port-a-cath access.      Marland Kitchen lisinopril (PRINIVIL,ZESTRIL) 5 MG tablet       . LORazepam (ATIVAN) 0.5 MG tablet TAKE ONE TABLET AT BEDTIME AS NEEDED FOR REST AND ANXIETY  30 tablet  0  . PRESCRIPTION MEDICATION Inject into the vein every 14 (fourteen) days. Avastin, Leucovorin, Adrucil, Eloxatin      . PROAIR HFA 108 (90 BASE) MCG/ACT inhaler       . prochlorperazine (COMPAZINE) 10 MG tablet       . vitamin B-12 (CYANOCOBALAMIN) 250 MCG tablet  Take 250 mcg by mouth daily.       No current facility-administered medications for this visit.   Facility-Administered Medications Ordered in Other Visits  Medication Dose Route Frequency Provider Last Rate Last Dose  . 0.9 %  sodium chloride infusion   Intravenous Once Si Gaul, MD      . sodium chloride 0.9 % injection 10 mL  10 mL Intracatheter PRN Si Gaul, MD      . sodium chloride 0.9 % injection 10 mL  10 mL Intracatheter PRN Si Gaul, MD        SURGICAL HISTORY:  Past Surgical History  Procedure Laterality Date  . Hernia  repair    . Right port a cath      power port right subclavian  . Colonoscopy  12/06/2011    Procedure: COLONOSCOPY;  Surgeon: Louis Meckel, MD;  Location: [facility];  Service: Endoscopy;  Laterality: N/A;  . Colonoscopy  12/06/2011    Procedure: COLONOSCOPY;  Surgeon: Louis Meckel, MD;  Location: [facility];  Service: Endoscopy;  Laterality: N/A;    REVIEW OF SYSTEMS:  A comprehensive review of systems was negative except for: Constitutional: positive for anorexia and fatigue Musculoskeletal: positive for muscle weakness   PHYSICAL EXAMINATION: General appearance: alert, cooperative, fatigued and no distress Head: Normocephalic, without obvious abnormality, atraumatic Neck: no adenopathy Lymph nodes: Cervical, supraclavicular, and axillary nodes normal. Resp: clear to auscultation bilaterally Cardio: regular rate and rhythm, S1, S2 normal, no murmur, click, rub or gallop GI: Distended but nontender to palpation Extremities: extremities normal, atraumatic, no cyanosis or edema Neurologic: Alert and oriented X 3, normal strength and tone. Normal symmetric reflexes. Normal coordination and gait  ECOG PERFORMANCE STATUS: 2 - Symptomatic, <50% confined to bed  Blood pressure 135/66, pulse 77, temperature 97.2 F (36.2 C), temperature source Oral, resp. rate 17, height 5\' 11"  (1.803 m), weight 141 lb 3.2 oz (64.048 kg).  LABORATORY DATA: Lab Results  Component Value Date   WBC 3.6* 10/21/2012   HGB 13.7 10/21/2012   HCT 41.2 10/21/2012   MCV 101.2* 10/21/2012   PLT 243 10/21/2012      Chemistry      Component Value Date/Time   NA 138 10/14/2012 1236   NA 134* 02/28/2012 1638   K 4.4 10/14/2012 1236   K 3.9 02/28/2012 1638   CL 102 10/14/2012 1236   CL 100 02/28/2012 1638   CO2 28 10/14/2012 1236   CO2 27 02/28/2012 1638   BUN 23.9 10/14/2012 1236   BUN 11 02/28/2012 1638   CREATININE 0.8 10/14/2012 1236   CREATININE 0.90 02/28/2012 1638      Component Value Date/Time   CALCIUM 9.5  10/14/2012 1236   CALCIUM 9.2 02/28/2012 1638   ALKPHOS 303* 10/14/2012 1236   ALKPHOS 153* 02/04/2012 1034   AST 63* 10/14/2012 1236   AST 36 02/04/2012 1034   ALT 37 10/14/2012 1236   ALT 22 02/04/2012 1034   BILITOT 1.99* 10/14/2012 1236   BILITOT 0.3 02/04/2012 1034       RADIOGRAPHIC STUDIES: No results found.  ASSESSMENT: this is a very pleasant 79 years old white male with metastatic colon adenocarcinoma currently on treatment with FOLFIRI/Avastin is status post 1 cycle.   PLAN: the patient tolerated the first cycle of his treatment fairly well with no significant adverse effects. I recommended for him to proceed with the second cycle today as scheduled. The patient has abdominal distention questionable for ascites secondary to his  disease but currently asymptomatic. I would consider the patient for ultrasound guided paracentesis if he becomes symptomatic. The patient and his wife were advised to call immediately if he started having more distention or shortness of breath. He would come back for followup visit in 2 weeks for reevaluation before starting cycle #3 of his chemotherapy.  All questions were answered. The patient knows to call the clinic with any problems, questions or concerns. We can certainly see the patient much sooner if necessary.

## 2012-10-21 NOTE — Patient Instructions (Signed)
Continue chemotherapy today as scheduled. Follow up visit in 2 week

## 2012-10-21 NOTE — Telephone Encounter (Signed)
GV AND PRINTED APPT SCHED AND AVS TO PT.Marland KitchenMarland KitchenMarland Kitchen

## 2012-10-23 ENCOUNTER — Ambulatory Visit (HOSPITAL_BASED_OUTPATIENT_CLINIC_OR_DEPARTMENT_OTHER): Payer: Medicare Other

## 2012-10-23 VITALS — BP 101/64 | HR 65 | Temp 97.2°F

## 2012-10-23 DIAGNOSIS — C189 Malignant neoplasm of colon, unspecified: Secondary | ICD-10-CM

## 2012-10-23 DIAGNOSIS — C180 Malignant neoplasm of cecum: Secondary | ICD-10-CM

## 2012-10-23 MED ORDER — HEPARIN SOD (PORK) LOCK FLUSH 100 UNIT/ML IV SOLN
500.0000 [IU] | Freq: Once | INTRAVENOUS | Status: AC | PRN
  Administered 2012-10-23: 500 [IU]
  Filled 2012-10-23: qty 5

## 2012-10-23 MED ORDER — SODIUM CHLORIDE 0.9 % IJ SOLN
10.0000 mL | INTRAMUSCULAR | Status: DC | PRN
  Administered 2012-10-23: 10 mL
  Filled 2012-10-23: qty 10

## 2012-10-23 NOTE — Patient Instructions (Signed)
Call MD for problems or concerns 

## 2012-10-26 ENCOUNTER — Other Ambulatory Visit: Payer: Self-pay | Admitting: Oncology

## 2012-10-26 DIAGNOSIS — C189 Malignant neoplasm of colon, unspecified: Secondary | ICD-10-CM

## 2012-10-26 DIAGNOSIS — R180 Malignant ascites: Secondary | ICD-10-CM

## 2012-10-27 ENCOUNTER — Ambulatory Visit (HOSPITAL_COMMUNITY)
Admission: RE | Admit: 2012-10-27 | Discharge: 2012-10-27 | Disposition: A | Discharge status: Home / Self Care | Payer: Medicare Other | Source: Ambulatory Visit | Attending: Oncology | Admitting: Oncology

## 2012-10-27 DIAGNOSIS — C787 Secondary malignant neoplasm of liver and intrahepatic bile duct: Secondary | ICD-10-CM | POA: Insufficient documentation

## 2012-10-27 DIAGNOSIS — R142 Eructation: Secondary | ICD-10-CM | POA: Insufficient documentation

## 2012-10-27 DIAGNOSIS — R180 Malignant ascites: Secondary | ICD-10-CM

## 2012-10-27 DIAGNOSIS — C189 Malignant neoplasm of colon, unspecified: Secondary | ICD-10-CM | POA: Insufficient documentation

## 2012-10-27 DIAGNOSIS — R188 Other ascites: Secondary | ICD-10-CM | POA: Insufficient documentation

## 2012-10-27 DIAGNOSIS — R141 Gas pain: Secondary | ICD-10-CM | POA: Insufficient documentation

## 2012-10-27 NOTE — Procedures (Signed)
Successful US guided paracentesis from [facility].  Yielded 2.3L of clear yellow fluid.  No immediate complications.  Pt tolerated well.   Specimen was not sent for labs.  Brayton El PA-C 10/27/2012 2:23 PM

## 2012-10-28 ENCOUNTER — Other Ambulatory Visit: Payer: Medicare Other | Admitting: Lab

## 2012-10-28 ENCOUNTER — Ambulatory Visit: Payer: Medicare Other

## 2012-10-28 ENCOUNTER — Other Ambulatory Visit: Payer: Self-pay | Admitting: *Deleted

## 2012-10-28 ENCOUNTER — Telehealth: Payer: Self-pay | Admitting: *Deleted

## 2012-10-28 DIAGNOSIS — F411 Generalized anxiety disorder: Secondary | ICD-10-CM

## 2012-10-28 MED ORDER — ALPRAZOLAM 0.25 MG PO TABS: ORAL_TABLET | ORAL | Status: DC

## 2012-10-28 NOTE — Telephone Encounter (Signed)
Pt's wife called stating that pt had paracentesis on 5/12 and they removed 2.5L.  He has been told in the radiology dept it may take a few days to begin feeling better.  He is still c/o hiccups, has been coughing up a lot of phlegm and is still not eating or drinking well.  Per Dr Donnald Garre, if he is getting worse he needs to go to the ED.  Joseph. Rowland states that he did eat some this morning.  They were given a rx for dicyclomine for his hiccups.  She has not been giving it to him.  She states that Joseph Rowland does want to wait a few days to see how he does and will begin taking dicyclomine to see if this helps.  Asked Joseph Rowland to keep [place] posted.  She verbalized understanding.  SLJ

## 2012-10-29 ENCOUNTER — Telehealth: Payer: Self-pay | Admitting: Medical Oncology

## 2012-10-29 ENCOUNTER — Other Ambulatory Visit: Payer: Self-pay | Admitting: Medical Oncology

## 2012-10-29 DIAGNOSIS — R066 Hiccough: Secondary | ICD-10-CM

## 2012-10-29 MED ORDER — BACLOFEN 10 MG PO TABS: 20.0000 mg | ORAL_TABLET | Freq: Three times a day (TID) | ORAL | Status: DC

## 2012-10-29 MED ORDER — BACLOFEN 20 MG PO TABS: 20.0000 mg | ORAL_TABLET | Freq: Three times a day (TID) | ORAL | Status: DC

## 2012-10-29 NOTE — Telephone Encounter (Signed)
Called in baclofen to [facility]

## 2012-10-29 NOTE — Telephone Encounter (Signed)
Per Adrena I called in baclofen

## 2012-10-29 NOTE — Telephone Encounter (Signed)
Persistent hiccoughs. Took 10mg   dicylomine yesterday and 20 mg at 0300 today. She did not give next dose because she does not think it helps , it just "knocks him out for an hour and the hiccoughs are back."

## 2012-10-30 ENCOUNTER — Telehealth: Payer: Self-pay | Admitting: Medical Oncology

## 2012-10-31 ENCOUNTER — Emergency Department (HOSPITAL_COMMUNITY): Payer: Medicare Other

## 2012-10-31 ENCOUNTER — Encounter (HOSPITAL_COMMUNITY): Payer: Self-pay | Admitting: Emergency Medicine

## 2012-10-31 ENCOUNTER — Observation Stay (HOSPITAL_COMMUNITY)
Admission: EM | Admit: 2012-10-31 | Discharge: 2012-11-02 | Disposition: A | Discharge status: Home / Self Care | Payer: Medicare Other | Attending: Internal Medicine | Admitting: Internal Medicine

## 2012-10-31 DIAGNOSIS — R0989 Other specified symptoms and signs involving the circulatory and respiratory systems: Principal | ICD-10-CM

## 2012-10-31 DIAGNOSIS — IMO0002 Reserved for concepts with insufficient information to code with codable children: Secondary | ICD-10-CM | POA: Insufficient documentation

## 2012-10-31 DIAGNOSIS — W19XXXA Unspecified fall, initial encounter: Secondary | ICD-10-CM | POA: Insufficient documentation

## 2012-10-31 DIAGNOSIS — Z9221 Personal history of antineoplastic chemotherapy: Secondary | ICD-10-CM | POA: Insufficient documentation

## 2012-10-31 DIAGNOSIS — R062 Wheezing: Secondary | ICD-10-CM | POA: Insufficient documentation

## 2012-10-31 DIAGNOSIS — Z9181 History of falling: Secondary | ICD-10-CM

## 2012-10-31 DIAGNOSIS — I428 Other cardiomyopathies: Secondary | ICD-10-CM | POA: Insufficient documentation

## 2012-10-31 DIAGNOSIS — I6200 Nontraumatic subdural hemorrhage, unspecified: Secondary | ICD-10-CM | POA: Insufficient documentation

## 2012-10-31 DIAGNOSIS — R066 Hiccough: Secondary | ICD-10-CM | POA: Insufficient documentation

## 2012-10-31 DIAGNOSIS — C7931 Secondary malignant neoplasm of brain: Secondary | ICD-10-CM

## 2012-10-31 DIAGNOSIS — R918 Other nonspecific abnormal finding of lung field: Secondary | ICD-10-CM | POA: Insufficient documentation

## 2012-10-31 DIAGNOSIS — D72819 Decreased white blood cell count, unspecified: Secondary | ICD-10-CM | POA: Diagnosis present

## 2012-10-31 DIAGNOSIS — Z79899 Other long term (current) drug therapy: Secondary | ICD-10-CM | POA: Insufficient documentation

## 2012-10-31 DIAGNOSIS — Z923 Personal history of irradiation: Secondary | ICD-10-CM | POA: Insufficient documentation

## 2012-10-31 DIAGNOSIS — R0609 Other forms of dyspnea: Principal | ICD-10-CM | POA: Insufficient documentation

## 2012-10-31 DIAGNOSIS — I513 Intracardiac thrombosis, not elsewhere classified: Secondary | ICD-10-CM

## 2012-10-31 DIAGNOSIS — M7989 Other specified soft tissue disorders: Secondary | ICD-10-CM

## 2012-10-31 DIAGNOSIS — D649 Anemia, unspecified: Secondary | ICD-10-CM | POA: Diagnosis not present

## 2012-10-31 DIAGNOSIS — C787 Secondary malignant neoplasm of liver and intrahepatic bile duct: Secondary | ICD-10-CM | POA: Diagnosis present

## 2012-10-31 DIAGNOSIS — C7951 Secondary malignant neoplasm of bone: Secondary | ICD-10-CM | POA: Insufficient documentation

## 2012-10-31 DIAGNOSIS — R0602 Shortness of breath: Secondary | ICD-10-CM | POA: Insufficient documentation

## 2012-10-31 DIAGNOSIS — C7800 Secondary malignant neoplasm of unspecified lung: Secondary | ICD-10-CM | POA: Insufficient documentation

## 2012-10-31 DIAGNOSIS — C189 Malignant neoplasm of colon, unspecified: Secondary | ICD-10-CM

## 2012-10-31 DIAGNOSIS — I700 Atherosclerosis of aorta: Secondary | ICD-10-CM | POA: Insufficient documentation

## 2012-10-31 DIAGNOSIS — D701 Agranulocytosis secondary to cancer chemotherapy: Secondary | ICD-10-CM

## 2012-10-31 DIAGNOSIS — F411 Generalized anxiety disorder: Secondary | ICD-10-CM | POA: Insufficient documentation

## 2012-10-31 DIAGNOSIS — I10 Essential (primary) hypertension: Secondary | ICD-10-CM | POA: Insufficient documentation

## 2012-10-31 DIAGNOSIS — R0600 Dyspnea, unspecified: Secondary | ICD-10-CM | POA: Diagnosis present

## 2012-10-31 DIAGNOSIS — R296 Repeated falls: Secondary | ICD-10-CM

## 2012-10-31 DIAGNOSIS — C719 Malignant neoplasm of brain, unspecified: Secondary | ICD-10-CM

## 2012-10-31 DIAGNOSIS — T451X5A Adverse effect of antineoplastic and immunosuppressive drugs, initial encounter: Secondary | ICD-10-CM

## 2012-10-31 DIAGNOSIS — D61818 Other pancytopenia: Secondary | ICD-10-CM | POA: Insufficient documentation

## 2012-10-31 LAB — COMPREHENSIVE METABOLIC PANEL
Albumin: 2.2 g/dL — ABNORMAL LOW (ref 3.5–5.2)
Alkaline Phosphatase: 236 U/L — ABNORMAL HIGH (ref 39–117)
BUN: 21 mg/dL (ref 6–23)
Calcium: 9.4 mg/dL (ref 8.4–10.5)
Creatinine, Ser: 0.66 mg/dL (ref 0.50–1.35)
GFR calc Af Amer: >90 mL/min (ref >90)
Glucose, Bld: 122 mg/dL — ABNORMAL HIGH (ref 70–99)
Potassium: 3.8 mEq/L (ref 3.5–5.1)
Total Protein: 6.7 g/dL (ref 6.0–8.3)

## 2012-10-31 LAB — URINALYSIS, ROUTINE W REFLEX MICROSCOPIC
Glucose, UA: NEGATIVE mg/dL
Hgb urine dipstick: NEGATIVE
Ketones, ur: NEGATIVE mg/dL
Leukocytes, UA: NEGATIVE
Protein, ur: NEGATIVE mg/dL
Urobilinogen, UA: 0.2 mg/dL (ref 0.0–1.0)

## 2012-10-31 LAB — CBC
Platelets: 160 10*3/uL (ref 150–400)
RBC: 3.89 MIL/uL — ABNORMAL LOW (ref 4.22–5.81)
RDW: 16.4 % — ABNORMAL HIGH (ref 11.5–15.5)
WBC: 3.2 10*3/uL — ABNORMAL LOW (ref 4.0–10.5)

## 2012-10-31 LAB — DIGOXIN LEVEL: Digoxin Level: 0.3 ng/mL — ABNORMAL LOW (ref 0.8–2.0)

## 2012-10-31 LAB — PHOSPHORUS: Phosphorus: 2.7 mg/dL (ref 2.3–4.6)

## 2012-10-31 LAB — MAGNESIUM: Magnesium: 1.8 mg/dL (ref 1.5–2.5)

## 2012-10-31 LAB — PROTIME-INR: INR: 1.19 (ref 0.00–1.49)

## 2012-10-31 MED ORDER — ONDANSETRON HCL 4 MG/2ML IJ SOLN: 4.0000 mg | Freq: Four times a day (QID) | INTRAMUSCULAR | Status: DC | PRN

## 2012-10-31 MED ORDER — VITAMIN D3 25 MCG (1000 UNIT) PO TABS
1000.0000 [IU] | ORAL_TABLET | Freq: Every day | ORAL | Status: DC
  Administered 2012-11-01 – 2012-11-02 (×2): 1000 [IU] via ORAL
  Filled 2012-10-31 (×3): qty 1

## 2012-10-31 MED ORDER — LEVALBUTEROL HCL 1.25 MG/3ML IN NEBU
1.2500 mg | INHALATION_SOLUTION | Freq: Four times a day (QID) | RESPIRATORY_TRACT | Status: DC
  Filled 2012-10-31 (×3): qty 3

## 2012-10-31 MED ORDER — ENOXAPARIN SODIUM 100 MG/ML ~~LOC~~ SOLN
65.0000 mg | Freq: Once | SUBCUTANEOUS | Status: DC
  Filled 2012-10-31: qty 1

## 2012-10-31 MED ORDER — IOHEXOL 350 MG/ML SOLN
100.0000 mL | Freq: Once | INTRAVENOUS | Status: AC | PRN
  Administered 2012-10-31: 100 mL via INTRAVENOUS

## 2012-10-31 MED ORDER — LEVALBUTEROL HCL 0.63 MG/3ML IN NEBU: 0.6300 mg | INHALATION_SOLUTION | Freq: Four times a day (QID) | RESPIRATORY_TRACT | Status: DC | PRN

## 2012-10-31 MED ORDER — LISINOPRIL 5 MG PO TABS
5.0000 mg | ORAL_TABLET | Freq: Every day | ORAL | Status: DC
  Administered 2012-11-01 – 2012-11-02 (×2): 5 mg via ORAL
  Filled 2012-10-31 (×3): qty 1

## 2012-10-31 MED ORDER — CYANOCOBALAMIN 250 MCG PO TABS
250.0000 ug | ORAL_TABLET | Freq: Every day | ORAL | Status: DC
  Administered 2012-11-01 – 2012-11-02 (×2): 250 ug via ORAL
  Filled 2012-10-31 (×3): qty 1

## 2012-10-31 MED ORDER — SODIUM CHLORIDE 0.9 % IV BOLUS (SEPSIS)
500.0000 mL | Freq: Once | INTRAVENOUS | Status: AC
  Administered 2012-10-31: 500 mL via INTRAVENOUS

## 2012-10-31 MED ORDER — PANTOPRAZOLE SODIUM 20 MG PO TBEC
20.0000 mg | DELAYED_RELEASE_TABLET | Freq: Every day | ORAL | Status: DC
  Administered 2012-11-01 – 2012-11-02 (×2): 20 mg via ORAL
  Filled 2012-10-31 (×3): qty 1

## 2012-10-31 MED ORDER — ALPRAZOLAM 0.25 MG PO TABS
0.2500 mg | ORAL_TABLET | Freq: Two times a day (BID) | ORAL | Status: DC | PRN
  Administered 2012-10-31: 0.25 mg via ORAL
  Filled 2012-10-31: qty 1

## 2012-10-31 MED ORDER — LORAZEPAM 2 MG/ML IJ SOLN
0.5000 mg | Freq: Once | INTRAMUSCULAR | Status: AC
  Administered 2012-10-31: 0.5 mg via INTRAVENOUS
  Filled 2012-10-31: qty 1

## 2012-10-31 MED ORDER — DIGOXIN 125 MCG PO TABS
0.1250 mg | ORAL_TABLET | Freq: Every day | ORAL | Status: DC
  Administered 2012-11-01 – 2012-11-02 (×2): 0.125 mg via ORAL
  Filled 2012-10-31 (×3): qty 1

## 2012-10-31 MED ORDER — ACETAMINOPHEN 650 MG RE SUPP: 650.0000 mg | Freq: Four times a day (QID) | RECTAL | Status: DC | PRN

## 2012-10-31 MED ORDER — ACETAMINOPHEN 325 MG PO TABS
650.0000 mg | ORAL_TABLET | Freq: Four times a day (QID) | ORAL | Status: DC | PRN
  Administered 2012-10-31: 650 mg via ORAL
  Filled 2012-10-31: qty 2

## 2012-10-31 MED ORDER — CARVEDILOL 3.125 MG PO TABS
1.5600 mg | ORAL_TABLET | Freq: Every day | ORAL | Status: DC
  Administered 2012-11-01 – 2012-11-02 (×2): 1.56 mg via ORAL
  Filled 2012-10-31 (×3): qty 0.5

## 2012-10-31 MED ORDER — ENOXAPARIN SODIUM 40 MG/0.4ML ~~LOC~~ SOLN
40.0000 mg | SUBCUTANEOUS | Status: DC
  Administered 2012-11-01 – 2012-11-02 (×2): 40 mg via SUBCUTANEOUS
  Filled 2012-10-31 (×3): qty 0.4

## 2012-10-31 MED ORDER — ONDANSETRON HCL 4 MG PO TABS: 4.0000 mg | ORAL_TABLET | Freq: Four times a day (QID) | ORAL | Status: DC | PRN

## 2012-10-31 MED ORDER — ASPIRIN EC 81 MG PO TBEC
81.0000 mg | DELAYED_RELEASE_TABLET | Freq: Every day | ORAL | Status: DC
  Administered 2012-11-01 – 2012-11-02 (×2): 81 mg via ORAL
  Filled 2012-10-31 (×3): qty 1

## 2012-10-31 MED ORDER — SODIUM CHLORIDE 0.9 % IV SOLN
INTRAVENOUS | Status: AC
  Administered 2012-10-31: 22:00:00 via INTRAVENOUS

## 2012-10-31 MED ORDER — SODIUM CHLORIDE 0.9 % IJ SOLN: 3.0000 mL | Freq: Two times a day (BID) | INTRAMUSCULAR | Status: DC

## 2012-10-31 NOTE — ED Provider Notes (Signed)
History     CSN: [id number] Arrival date & time 10/31/12  0940  First MD Initiated Contact with Patient 10/31/12 [phone]     Chief Complaint  Patient presents with  . Shortness of Breath   HPI Comments: And is a 79 year old male who presents with acute shortness of breath.  He and his wife report having a difficult time taking a deep breath and states this awoken him from sleep this morning.  He has never experienced anything like this previously.  Patient reports  his shortness of breath is intermittent and will spontaneously resolve and re-occur.  He denies any chest pressure, chest pain, diaphoresis, fevers, cough.  Patient does have history of stage IV colon cancer and is currently undergoing chemotherapy and is status post radiation for brain metastasis.  He most recently underwent a therapeutic paracentesis 2 days ago secondary to abdominal distention and complaints of hiccups.  He was started on baclofen and reports significant improvement with the hiccups however he is become slightly more confused with this medicine.  His wife also reports he had a fall 2 days ago where he fell and struck his head on fireplace.  He also has a shoulder abrasion from this incident.  Pt denies LOC, headache, focal weakness or new slurred speech.  He has been seen by Dr. Jacinto Halim in the past for his cardiac care but wife and patient deny any history of coronary artery disease or CHF (20% EF due to dilated Cardiomyopathy discovered upon further chart review).  He denies any history of atrial fibrillation however was on Coumadin for a suspected left ventricular blood clot.  His Coumadin was stopped approximately one month ago.    Past Medical History  Diagnosis Date  . Lung mass   . Liver cancer   . metastatic adenocarcinoma 11/14/11    liver bx=metastatic adenocarcino58ma with tumor necrosis kras  mutation and involving brain,lung,and bones  . Brain cancer     lesions in occipital lobes,b/l suspicious mets  . Lung  cancer     b/l multifocal pulmonary mets  . Hypertension   . Anemia   . History of radiation therapy 12/07/11    SRS single fraction palliative 2 intracranial lesions  . History of chemotherapy 01/07/12    FOLFAX/AVASTIN s/p 3 cycles   . Mitral regurgitation   . Dilated cardiomyopathy   . Shortness of breath     on exertion  . Chemotherapy induced neutropenia 05/20/2012    Past Surgical History  Procedure Laterality Date  . Hernia repair    . Right port a cath      power port right subclavian  . Colonoscopy  12/06/2011    Procedure: COLONOSCOPY;  Surgeon: Louis Meckel, MD;  Location: [facility];  Service: Endoscopy;  Laterality: N/A;  . Colonoscopy  12/06/2011    Procedure: COLONOSCOPY;  Surgeon: Louis Meckel, MD;  Location: [facility];  Service: Endoscopy;  Laterality: N/A;    Family History  Problem Relation Age of Onset  . Colon cancer Neg Hx     History  Substance Use Topics  . Smoking status: Former Smoker    Types: Cigarettes    Quit date: 06/18/1978  . Smokeless tobacco: Current User  . Alcohol Use: No    Review of Systems  Constitutional: Positive for unexpected weight change (weight loss given chemo, chronic malnutrition). Negative for fever, chills, activity change and fatigue.  HENT: Negative for facial swelling, trouble swallowing and neck pain.   Eyes: Negative for  visual disturbance (no amorosis fugax).  Respiratory: Positive for shortness of breath. Negative for apnea, cough, choking, chest tightness, wheezing and stridor.   Gastrointestinal: Positive for nausea. Negative for vomiting, abdominal pain, diarrhea and constipation.  Endocrine: Negative.   Genitourinary: Positive for frequency (since starting baclofen). Negative for dysuria, urgency, difficulty urinating and testicular pain.       Incontinent of stool and urine , but unchanged  Musculoskeletal:       Requires assistance with ambulation  Skin:       Senile purpura   Allergic/Immunologic: Positive for immunocompromised state.  Neurological: Positive for weakness. Negative for dizziness, tremors, seizures, syncope, facial asymmetry, speech difficulty, light-headedness, numbness and headaches.  Psychiatric/Behavioral: Positive for confusion and agitation. Negative for hallucinations, behavioral problems, dysphoric mood and decreased concentration. The patient is nervous/anxious. The patient is not hyperactive.     Allergies  Review of patient's allergies indicates no known allergies.  Home Medications   Current Outpatient Rx  Name  Route  Sig  Dispense  Refill  . ALPRAZolam (XANAX) 0.25 MG tablet   Oral   Take 1.25 mg by mouth every evening.         Marland Kitchen aspirin EC 81 MG tablet   Oral   Take 81 mg by mouth daily.         . baclofen (LIORESAL) 20 MG tablet   Oral   Take 10 mg by mouth 3 (three) times daily.         . carvedilol (COREG) 3.125 MG tablet   Oral   Take 1.56 mg by mouth daily.          . cholecalciferol (VITAMIN D) 1000 UNITS tablet   Oral   Take 1,000 Units by mouth daily.         . digoxin (LANOXIN) 0.125 MG tablet   Oral   Take 0.125 mg by mouth daily.          . lansoprazole (PREVACID) 15 MG capsule   Oral   Take 15 mg by mouth daily.         Marland Kitchen lisinopril (PRINIVIL,ZESTRIL) 5 MG tablet   Oral   Take 5 mg by mouth daily.          Marland Kitchen loratadine (CLARITIN) 10 MG tablet   Oral   Take 10 mg by mouth daily.         . prochlorperazine (COMPAZINE) 10 MG tablet      10 mg every 6 (six) hours as needed.          . vitamin B-12 (CYANOCOBALAMIN) 250 MCG tablet   Oral   Take 250 mcg by mouth daily.         Marland Kitchen PRESCRIPTION MEDICATION   Intravenous   Inject into the vein every 14 (fourteen) days. Avastin, Irinotecan, Leucovorin, Flurouracil (FOLFIRI)           BP 145/86  Pulse 75  Temp(Src) 98.2 F (36.8 C) (Rectal)  Resp 21  SpO2 99%  Physical Exam  Nursing note and vitals  reviewed. Constitutional: No distress.  Extremely cachetic  HENT:  Head: Normocephalic. Head is with abrasion. Head is without raccoon's eyes, without Battle's sign, without contusion and without laceration.    Eyes: Conjunctivae are normal. Right eye exhibits no discharge. Left eye exhibits no discharge. No scleral icterus.  Neck: Normal range of motion. No JVD present. No tracheal deviation present.  Cardiovascular: Normal heart sounds and intact distal pulses.  An irregularly irregular rhythm present.  Tachycardia present.  Exam reveals no gallop and no friction rub.   No murmur heard. Pulses:      Radial pulses are 2+ on the right side, and 2+ on the left side.       Dorsalis pedis pulses are 2+ on the right side, and 2+ on the left side.       Posterior tibial pulses are 2+ on the right side, and 2+ on the left side.  Pulmonary/Chest: Effort normal. No respiratory distress. He has wheezes (diffuse slight end expiratory). He has no rales.    Abdominal: Soft. He exhibits no distension and no mass. There is no tenderness. There is no rebound and no guarding.  Musculoskeletal: Normal range of motion. He exhibits edema (trace LUE edema, no LE edema). He exhibits no tenderness.  Lymphadenopathy:    He has no cervical adenopathy.  Neurological: He is alert. He exhibits normal muscle tone. GCS eye subscore is 4. GCS verbal subscore is 5. GCS motor subscore is 6.  Gross Deficits: No lateralization, no pronator drift Speech: Non-fluent Gait: Not assessed Cerebellar:  CN: CN II - XI intact Motor: LE and UE myotomes 2+/4 Sensation: Grossly intact     Skin: Skin is warm and dry. Abrasion noted. No rash noted. He is not diaphoretic. No erythema. No pallor.     Psychiatric: He has a normal mood and affect. His behavior is normal. Judgment and thought content normal.    ED Course  Procedures (including critical care time)  Labs Reviewed  COMPREHENSIVE METABOLIC PANEL - Abnormal;  Notable for the following:    Sodium 134 (*)    Glucose, Bld 122 (*)    Albumin 2.2 (*)    AST 38 (*)    Alkaline Phosphatase 236 (*)    All other components within normal limits  LACTIC ACID, PLASMA - Abnormal; Notable for the following:    Lactic Acid, Venous 2.4 (*)    All other components within normal limits  URINALYSIS, ROUTINE W REFLEX MICROSCOPIC - Abnormal; Notable for the following:    Color, Urine AMBER (*)    Bilirubin Urine SMALL (*)    All other components within normal limits  DIGOXIN LEVEL - Abnormal; Notable for the following:    Digoxin Level 0.3 (*)    All other components within normal limits  CBC - Abnormal; Notable for the following:    WBC 3.2 (*)    RBC 3.89 (*)    HCT 37.7 (*)    MCH 34.7 (*)    RDW 16.4 (*)    All other components within normal limits  CULTURE, BLOOD (ROUTINE X 2)  CULTURE, BLOOD (ROUTINE X 2)  URINE CULTURE  MAGNESIUM  PHOSPHORUS  TROPONIN I  PROTIME-INR   Dg Chest 2 View  10/31/2012   *RADIOLOGY REPORT*  Clinical Data: Short of breath.  Metastatic colon cancer  CHEST - 2 VIEW  Comparison: 02/28/2012.  Chest CT 09/16/2012  Findings: 2.5 cm right lower lobe nodule unchanged.  2 cm left midlung nodule unchanged.  15 mm left upper lobe nodule unchanged. These nodules are compatible with known metastatic disease.  Port-A- Cath tip remains in the SVC.  Negative for heart failure or pneumonia.  IMPRESSION: Bilateral lung nodules are stable.  No superimposed heart failure or pneumonia.   Original Report Authenticated By: Janeece Riggers, M.D.   Ct Head Wo Contrast  10/31/2012   *RADIOLOGY REPORT*  Clinical Data: Fall.  Metastatic colon cancer.  Radiation treatment for metastatic  disease to the brain.  CT HEAD WITHOUT CONTRAST  Technique:  Contiguous axial images were obtained from the base of the skull through the vertex without contrast.  Comparison: MRI 09/19/2012  Findings: Small right-sided subdural hygroma unchanged.  Increase in left frontal  subdural hygroma measuring approximately 6 mm.  4 mm midline shift has increased in the interval.  No high density acute hemorrhage.  No acute infarct or mass.  No acute hemorrhage.  Mild patchy white matter hypodensity may be due to radiation or chronic microvascular ischemia.  IMPRESSION: Progression of 6 mm left frontal subdural hygroma with mild midline shift to the right.  This has progressed from the MRI of 09/19/2012.  No acute hemorrhage or infarct.  No focal mass lesion.   Original Report Authenticated By: Janeece Riggers, M.D.    1. Dyspnea      MDM  Pt noted to be in A Fib with RVR to 120s when initially entering room; patient had shortness of breath at that time.  Patient heart rate spontaneously improved to the 80s and patient's symptoms resolved.  However patient did have recurrence of RVR and symptoms while being evaluated.  No hypoxia, minimal wheezing.  Given recent fall and slightly altered mental status consider medication reaction versus subdural bleed.  Head CT given history of brain radiation and metastatic process.  Will obtain chest x-ray, complete electrolyte panel and troponin.  VSS.  Consider occult infection including SBP given recent paracentesis, will check Blood Cx, Urine, and lactate.  Pt will need to be admitted.    Consider possible PE but given slight AMS less consistent.    1130 - Pt's dyspneic symptoms have resolved and HR is now bradycardic to 55.  Otherwise VSS. CT Head with slightly enlarging 6mm L frontal hygroma, with 4mm mid line shift.  CXR without new changes, stable mets.  Records reviewed from Dr. Drue Dun office visit in 04/2012.  No hx of A Fib but wandering atrial pacemaker and EF of 20% noted from echo.  Medical therapy only  planned at this time.  Especially now that bradycardic less consistent with PE.  Digoxin level ordered.  Will plan to discuss with Dr. Jacinto Halim.    1330 - patient is resting comfortably with an irregular heart rate in the 60s.  In  discussing options with Dr. Jacinto Halim at this point there is no new intervention that is appropraite for his new onset A. fib with RVR given his recovery to a bradycardic rate and not currently an anti-coagulation candidate.  Troponin I negative X 1.  He does recommend checking a digoxin level that has already been done but is pending.  In talking with the family in discussing options about discharge to home versus admission for monitoring patient does elect to be admitted for further workup of this new onset A. fib with RVR.  Patient is at risk for sudden cardiac death given his poor EF however is not a candidate for further intervention or AICD placement.   His anticoagulation had previously been stopped.  Given persistent symptoms will treat with Ativan and will obtain left upper extremity Doppler.  Hold anticoagulation given hygroma.  Will attempt to obtain CTA chest to eval for PE but pt is uncomfortable and having a difficult time laying still.  He is also followed by Dr.  Shirline Frees (oncology) who will likely need to be consulted after admission by the hospitalists.    In discussing code status with patient and family members he wishes to be  a full code.    1550 - [facility] to admit to telemetry.  Andrena Mews, DO 10/31/12 1555

## 2012-10-31 NOTE — ED Provider Notes (Signed)
I saw and evaluated the patient, reviewed the resident's note and I agree with the findings and plan.   Date: 10/31/2012  Rate: 85  Rhythm: Atrial fibrillation  QRS Axis: normal  Intervals: normal  ST/T Wave abnormalities: normal  Conduction Disutrbances: none  Narrative Interpretation:   Old EKG Reviewed: A. fib is new   Patient presents with what sounds like worsening shortness of breath with exertion.  He has had some altered mental status and it does appear as though his hygroma has worsened slightly however I do not think he is a surgical candidate for this given his advanced disease.  I don't think this is the mainstay of his presentation today.  The majority of his presentation was more secondary to dyspnea.  Chest x-ray is clear.  CT angiogram images pending at this time.  He has received a dose of Lovenox.  Venous duplexes upper extremities negative for DVT.  The patient be admitted to the hospitalist service     Lyanne Co, MD 10/31/12 [phone]

## 2012-10-31 NOTE — ED Notes (Signed)
Per Pt's wife, pt fell on Wednesday hitting his head and left shoulder on the brick fireplace.Pt has mild swelling noted on L forehead, as well as redness. There is skin tear on his left shoulder.

## 2012-10-31 NOTE — H&P (Signed)
[facility] History and Physical  Kreg Earhart ZOX:[id number] DOB: [birth year] DOA: 10/31/2012  Referring physician: Dr. Azalia Bilis, EDP PCP: [name], FNP  OP Specialists:  1. Oncology: Dr. Si Gaul 2. Radiation Oncology: Dr. Mitzi Hansen 3. Neuro Surgery: Dr. Barnett Abu. 4. Cardiology: Dr. Delrae Rend   Chief Complaint: Difficulty breathing  HPI: Joseph Rowland is a 79 y.o. male with PMH of metastatic colon adenocarcinoma with metastasis to the brain, lung, liver and bone, on active chemotherapy (currently on treatment with FOLFIRI/Avastin is status post 1 cycle), status post stereotactic radiosurgery to 2 brain lesions, reported left ventricle mural thrombus (subsequently told not to have it by cardiology), HTN, anemia and dilated cardiomyopathy with LVEF <20% in 2013, presented to the [facility] ED on 10/31/12 with acute onset of dyspnea this morning. Patient is currently sedated after a dose of IV Ativan and is unable to provide any history. History is obtained from patient's spouse, daughter and daughter-in-law at the bedside. Per family, for the last 6 months patient has a period between 4 to 6 PM every evening where he starts complaining of difficulty breathing and repeatedly sits up and lies down multiple times. This is attributed to anxiety and either settle spontaneously or after her dose of Xanax. Today however, patient started complaining of difficulty breathing at 8 AM and started doing the same thing that is sitting up and laying down repeatedly. No reported chest pain, palpitations, fever, cough or wheezing. Family brought him to the ED where chest x-ray showed stable pulmonary metastasis but no acute findings suggestive of pneumonia or CHF. His blood work is unremarkable except for mildly low white cell count. In the ED, he was initially apparently noted to be in A. fib with RVR in the 120s which subsequently resolved spontaneously. Patient also  had episodes of dyspnea which currently seems to have resolved after a dose of Ativan and patient is resting comfortably. Per family, patient is unsteady on his feet and has fallen several times. He fell 2-3 days ago while trying to get up from the wheelchair and hit the left side of his forehead, left shoulder and left knee. No loss of consciousness reported at that time. He also had therapeutic paracentesis of approximately 2 L couple days back. Following this he had persistent hiccups and was prescribed baclofen which apparently caused confusion. Left upper extremity apparently has been mildly swollen for the last couple of days. Venous Doppler of LUE negative for clots. CT angiogram of chest is pending. Hospitalist admission requested. CT head showed progression of 6 mm left frontal subdural hygroma with mild midline shift to the right without acute hemorrhage, infarct or focal mass lesion.     Review of Systems: All systems reviewed and are history of presenting illness, are negative.   Past Medical History  Diagnosis Date  . Lung mass   . Liver cancer   . metastatic adenocarcinoma 11/14/11    liver bx=metastatic adenocarcino5ma with tumor necrosis kras  mutation and involving brain,lung,and bones  . Brain cancer     lesions in occipital lobes,b/l suspicious mets  . Lung cancer     b/l multifocal pulmonary mets  . Hypertension   . Anemia   . History of radiation therapy 12/07/11    SRS single fraction palliative 2 intracranial lesions  . History of chemotherapy 01/07/12    FOLFAX/AVASTIN s/p 3 cycles   . Mitral regurgitation   . Dilated cardiomyopathy   . Shortness of breath  on exertion  . Chemotherapy induced neutropenia 05/20/2012   Past Surgical History  Procedure Laterality Date  . Hernia repair    . Right port a cath      power port right subclavian  . Colonoscopy  12/06/2011    Procedure: COLONOSCOPY;  Surgeon: Louis Meckel, MD;  Location: [facility] ENDOSCOPY;  Service:  Endoscopy;  Laterality: N/A;  . Colonoscopy  12/06/2011    Procedure: COLONOSCOPY;  Surgeon: Louis Meckel, MD;  Location: [facility] ENDOSCOPY;  Service: Endoscopy;  Laterality: N/A;   Social History:  reports that he quit smoking about 34 years ago. His smoking use included Cigarettes. He smoked 0.00 packs per day. He uses smokeless tobacco. He reports that he does not drink alcohol or use illicit drugs. Married. Lives with spouse. Dependent on activities such as bathing, going to the toilet, shaving. Ambulates with the help of assistance.  No Known Allergies  Family History  Problem Relation Age of Onset  . Colon cancer Neg Hx     Prior to Admission medications   Medication Sig Start Date End Date Taking? Authorizing Provider  ALPRAZolam (XANAX) 0.25 MG tablet Take 1.25 mg by mouth every evening.   Yes Historical Provider, MD  aspirin EC 81 MG tablet Take 81 mg by mouth daily.   Yes Historical Provider, MD  baclofen (LIORESAL) 20 MG tablet Take 10 mg by mouth 3 (three) times daily.   Yes Historical Provider, MD  carvedilol (COREG) 3.125 MG tablet Take 1.56 mg by mouth daily.  03/17/12  Yes Historical Provider, MD  cholecalciferol (VITAMIN D) 1000 UNITS tablet Take 1,000 Units by mouth daily.   Yes Historical Provider, MD  digoxin (LANOXIN) 0.125 MG tablet Take 0.125 mg by mouth daily.  03/17/12  Yes Historical Provider, MD  lansoprazole (PREVACID) 15 MG capsule Take 15 mg by mouth daily.   Yes Historical Provider, MD  lisinopril (PRINIVIL,ZESTRIL) 5 MG tablet Take 5 mg by mouth daily.  04/29/12  Yes Historical Provider, MD  loratadine (CLARITIN) 10 MG tablet Take 10 mg by mouth daily.   Yes Historical Provider, MD  prochlorperazine (COMPAZINE) 10 MG tablet 10 mg every 6 (six) hours as needed.  07/30/12  Yes Historical Provider, MD  vitamin B-12 (CYANOCOBALAMIN) 250 MCG tablet Take 250 mcg by mouth daily.   Yes Historical Provider, MD  PRESCRIPTION MEDICATION Inject into the vein every 14  (fourteen) days. Avastin, Irinotecan, Leucovorin, Flurouracil (FOLFIRI)    Historical Provider, MD   Physical Exam: Filed Vitals:   10/31/12 1009 10/31/12 1253  BP: 145/86   Pulse: 75   Temp:  98.2 F (36.8 C)  TempSrc:  Rectal  Resp: 21   SpO2: 99%      General exam: Moderately built and frail elderly male patient, lying comfortably supine on the gurney in no obvious distress.  Head, eyes and ENT: Normocephalic. Pupils equally reacting to light and accommodation. Patient not cooperating with oral exam. Patient has a small area of abrasion on left side of forehead.  Neck: Supple. No JVD, carotid bruit or thyromegaly.  Lymphatics: No lymphadenopathy.  Respiratory system: Clear to auscultation. No increased work of breathing.  Cardiovascular system: S1 and S2 heard, RRR. No JVD, murmurs, gallops, clicks or pedal edema. Telemetry shows sinus rhythm.  Gastrointestinal system: Abdomen is nondistended, soft and nontender. Normal bowel sounds heard. No organomegaly or masses appreciated.  Central nervous system: Currently sedated. To touch will mumble incomprehensibly but does not obey commands.. No focal neurological deficits.  Extremities: Peripheral pulses symmetrically felt. Patient has purposeful movements of upper extremities and seems normal power. Patient also has a clean small abrasion on left shoulder and left knee.  Skin: No rashes or acute findings.  Musculoskeletal system: Negative exam.  Psychiatry: Unable to assess.   Labs on Admission:  Basic Metabolic Panel:  Recent Labs Lab 10/31/12 1010  NA 134*  K 3.8  CL 97  CO2 28  GLUCOSE 122*  BUN 21  CREATININE 0.66  CALCIUM 9.4  MG 1.8  PHOS 2.7   Liver Function Tests:  Recent Labs Lab 10/31/12 1010  AST 38*  ALT 23  ALKPHOS 236*  BILITOT 1.0  PROT 6.7  ALBUMIN 2.2*   No results found for this basename: LIPASE, AMYLASE,  in the last 168 hours No results found for this basename: AMMONIA,  in the  last 168 hours CBC:  Recent Labs Lab 10/31/12 1300  WBC 3.2*  HGB 13.5  HCT 37.7*  MCV 96.9  PLT 160   Cardiac Enzymes:  Recent Labs Lab 10/31/12 1010  TROPONINI <0.30    BNP (last 3 results) No results found for this basename: PROBNP,  in the last 8760 hours CBG: No results found for this basename: GLUCAP,  in the last 168 hours  Radiological Exams on Admission: Dg Chest 2 View  10/31/2012   *RADIOLOGY REPORT*  Clinical Data: Short of breath.  Metastatic colon cancer  CHEST - 2 VIEW  Comparison: 02/28/2012.  Chest CT 09/16/2012  Findings: 2.5 cm right lower lobe nodule unchanged.  2 cm left midlung nodule unchanged.  15 mm left upper lobe nodule unchanged. These nodules are compatible with known metastatic disease.  Port-A- Cath tip remains in the SVC.  Negative for heart failure or pneumonia.  IMPRESSION: Bilateral lung nodules are stable.  No superimposed heart failure or pneumonia.   Original Report Authenticated By: Janeece Riggers, M.D.   Ct Head Wo Contrast  10/31/2012   *RADIOLOGY REPORT*  Clinical Data: Fall.  Metastatic colon cancer.  Radiation treatment for metastatic disease to the brain.  CT HEAD WITHOUT CONTRAST  Technique:  Contiguous axial images were obtained from the base of the skull through the vertex without contrast.  Comparison: MRI 09/19/2012  Findings: Small right-sided subdural hygroma unchanged.  Increase in left frontal subdural hygroma measuring approximately 6 mm.  4 mm midline shift has increased in the interval.  No high density acute hemorrhage.  No acute infarct or mass.  No acute hemorrhage.  Mild patchy white matter hypodensity may be due to radiation or chronic microvascular ischemia.  IMPRESSION: Progression of 6 mm left frontal subdural hygroma with mild midline shift to the right.  This has progressed from the MRI of 09/19/2012.  No acute hemorrhage or infarct.  No focal mass lesion.   Original Report Authenticated By: Janeece Riggers, M.D.   Dg Chest  Portable 1 View  10/31/2012   *RADIOLOGY REPORT*  Clinical Data: Shortness of breath. Colon cancer with metastatic disease.  PORTABLE CHEST - 1 VIEW  Comparison: 10/31/2012 at 1031 hours  Findings: A right-sided Port-A-Cath which is unchanged and terminates at the low SVC.  Normal heart size.  Aortic atherosclerosis. No pleural effusion or pneumothorax.  No congestive failure.  No lobar consolidation.  Bilateral pulmonary nodules again identified, consistent with metastatic disease.  A skin fold over the left hemithorax.  IMPRESSION: No change since earlier in the day.  Pulmonary metastasis, without evidence of acute superimposed process.   Original Report Authenticated  By: Jeronimo Greaves, M.D.    EKG: Independently reviewed. Baseline artifact. Seems to be in sinus rhythm with PACs in no acute findings. Patient has history of wandering pacemaker.  Assessment/Plan Principal Problem:   Acute dyspnea Active Problems:   Metastatic colon cancer to liver, lungs, brain & bone   Frequent falls   Leukopenia   1. Acute dyspnea: Unclear etiology. Patient does have history of daily dyspnea at almost the same time, most likely related to anxiety. DD-rule out pulmonary embolism versus anxiety. Not clinically suggestive of pneumonia, CHF, COPD or worsening metastatic disease. Currently seems to have resolved after Ativan. Admit to telemetry for observation. Follow CTA chest. When necessary bronchodilators and anxiolytics. 2. Frequent falls: Will get PT and OT to evaluate. 3. Leukopenia: Secondary to chemotherapy. Follow CBC in a.m. 4. Metastatic colon cancer to liver, lungs, brain and bone: Status post stereotactic brain radiosurgery. OP followup with oncology. 5. Left frontal subdural hygroma: Progressed with mild midline shift to the right compared to imaging of 09/19/12. Discussed with Dr. Maeola Harman, neurosurgery who indicated that there was no intervention needed. He also indicated that patient could be  anticoagulated for other indication if needed. 6. Hypertension: Controlled. Continue home medications. 7. Dilated cardiomyopathy: Apparently no history of CHF. Continue aspirin, low-dose beta blocker and ACE inhibitors. Outpatient followup. 8. Anxiety disorder: When necessary anxiolytics     Code Status: Full  Family Communication: Discussed with patient spouse, daughter and daughter-in-law at bedside.  Disposition Plan: Admit to telemetry for observation and evaluation.   Time spent: 65 minutes.  [facility] Pager [phone]  If 7PM-7AM, please contact night-coverage [email or web address] Password [facility] 10/31/2012, 3:56 PM

## 2012-10-31 NOTE — Progress Notes (Signed)
Wife reports having support system to include her grandson and daughter who live beside her, pt's brother visits and male at bedside provides services through her home health/private duty business Wife also denied need for hospice in home and hospice facility services Offered home health and home hospice choices but refused

## 2012-10-31 NOTE — ED Notes (Signed)
Pt states that he woke up about 4 hours ago with SOB.  Denies chest pain/tightness.  100% on RA.  Some wheezing on exam.  Stage 4 colon cancer mets to lung and brain.  No cardiac hx.

## 2012-10-31 NOTE — Progress Notes (Signed)
*  PRELIMINARY RESULTS* Vascular Ultrasound Left upper extremity venous duplex has been completed.  Preliminary findings: Left = no evidence of deep or superificial thrombosis. The left subclavian vein demonstrates rouleaux flow, which could indicate pre-thrombus formation.  Farrel Demark, RDMS, RVT  10/31/2012, 3:40 PM

## 2012-10-31 NOTE — Progress Notes (Signed)
[facility] ED Cm noted CM consult for pt. Cm spoke with pt's wife and male at bedside Pt with eyes closed, sleeping during interaction with his wife.   The spouse confirms at d/c she prefers to take pt home Reports the male at the bedside has assisted her with private duty nursing and [facility] services Wife stated she was interested only in respite care services Pt/wife left [facility] ED prior to CM being able to give information to wife. CM tubed information to [facility] [place], spoke with "courtney" and requested tubed information for [facility]), personal care services to include [facility] and private duty nursing respite companies.

## 2012-11-01 DIAGNOSIS — D649 Anemia, unspecified: Secondary | ICD-10-CM

## 2012-11-01 LAB — URINE CULTURE
Colony Count: NO GROWTH
Culture: NO GROWTH

## 2012-11-01 LAB — CBC
HCT: 34.3 % — ABNORMAL LOW (ref 39.0–52.0)
Hemoglobin: 11.8 g/dL — ABNORMAL LOW (ref 13.0–17.0)
MCH: 33.4 pg (ref 26.0–34.0)
MCHC: 34.4 g/dL (ref 30.0–36.0)
MCV: 97.2 fL (ref 78.0–100.0)
Platelets: 152 K/uL (ref 150–400)
RBC: 3.53 MIL/uL — ABNORMAL LOW (ref 4.22–5.81)
RDW: 16.8 % — ABNORMAL HIGH (ref 11.5–15.5)
WBC: 2.7 K/uL — ABNORMAL LOW (ref 4.0–10.5)

## 2012-11-01 MED ORDER — SODIUM CHLORIDE 0.9 % IV SOLN
INTRAVENOUS | Status: AC
  Administered 2012-11-01: 23:00:00 via INTRAVENOUS

## 2012-11-01 MED ORDER — ALPRAZOLAM 0.25 MG PO TABS
0.2500 mg | ORAL_TABLET | Freq: Three times a day (TID) | ORAL | Status: DC | PRN
  Administered 2012-11-01 – 2012-11-02 (×3): 0.25 mg via ORAL
  Filled 2012-11-01 (×3): qty 1

## 2012-11-01 NOTE — Progress Notes (Signed)
Patient has not voided since transfer from the ED--called extender on call--I & O cath ordered, 150cc dark tea colored urine noted. Tama Gander, RN, BSN.

## 2012-11-01 NOTE — Evaluation (Signed)
Physical Therapy Evaluation Patient Details Name: Joseph Rowland MRN: [id number] DOB: 05-17-1934 Today's Date: 11/01/2012 Time: 1453-1510 PT Time Calculation (min): 17 min  PT Assessment / Plan / Recommendation Clinical Impression  Pt is a 79 year old male admitted for dyspnea and frequent falls with hx of metastatic colon cancer.  Spouse reports pt ambulating without assistive device a few weeks ago however recently she has to transfer him from couch where he sleeps to w/c.  Spouse declines SNF however agreeable to HHPT to attempt to progress pt's activity tolerance and strength and educate her on safe transfers.  Pt would benefit from acute PT services in order to improve independence with transfer and ambulation to prepare for d/c home with spouse.  Spouse states she would like to work on pt walking however not comfortable performing with him herself due to recent falls.  Pt not talkative during evaluation however responded to yes/no questions.    PT Assessment  Patient needs continued PT services    Follow Up Recommendations  Home health PT    Does the patient have the potential to tolerate intense rehabilitation      Barriers to Discharge        Equipment Recommendations  None recommended by PT    Recommendations for Other Services     Frequency Min 3X/week    Precautions / Restrictions Precautions Precautions: Fall   Pertinent Vitals/Pain n/a      Mobility  Bed Mobility Bed Mobility: Supine to Sit Supine to Sit: 2: Max assist;HOB elevated Details for Bed Mobility Assistance: assist for LEs over bed and trunk upright Transfers Transfers: Stand to Sit;Sit to Stand;Stand Pivot Transfers Sit to Stand: 2: Max assist;With upper extremity assist;From elevated surface;From bed Stand to Sit: 2: Max assist;With upper extremity assist;To chair/3-in-1 Stand Pivot Transfers: 2: Max assist Details for Transfer Assistance: spouse assisted PT with transfer, pt provided with 2 HHA  and required max assist for weakness, better at following commands given by spouse, decreased knee extension with standing Ambulation/Gait Ambulation/Gait Assistance: Not tested (comment)    Exercises     PT Diagnosis: Difficulty walking;Generalized weakness  PT Problem List: Decreased strength;Decreased activity tolerance;Decreased mobility;Decreased balance;Decreased knowledge of use of DME;Decreased safety awareness PT Treatment Interventions: DME instruction;Gait training;Functional mobility training;Therapeutic activities;Therapeutic exercise   PT Goals Acute Rehab PT Goals PT Goal Formulation: With patient/family Time For Goal Achievement: 11/15/12 Potential to Achieve Goals: Fair Pt will go Supine/Side to Sit: with min assist;with HOB not 0 degrees (comment degree) PT Goal: Supine/Side to Sit - Progress: Goal set today Pt will go Sit to Supine/Side: with min assist PT Goal: Sit to Supine/Side - Progress: Goal set today Pt will go Sit to Stand: with min assist PT Goal: Sit to Stand - Progress: Goal set today Pt will go Stand to Sit: with min assist PT Goal: Stand to Sit - Progress: Goal set today Pt will Transfer Bed to Chair/Chair to Bed: with min assist PT Transfer Goal: Bed to Chair/Chair to Bed - Progress: Goal set today Pt will Ambulate: 1 - 15 feet;with rolling walker;with max assist PT Goal: Ambulate - Progress: Goal set today  Visit Information  Last PT Received On: 11/01/12 Assistance Needed: +2    Subjective Data  Subjective: Ok Patient Stated Goal: spouse would like home with HHPT   Prior Functioning  Home Living Lives With: Spouse Available Help at Discharge: Other (Comment) (spouse reports she is looking into more care at home) Type of Home: House Home  Adaptive Equipment: Bedside commode/3-in-1;Walker - rolling;Wheelchair - manual Additional Comments: Spouse reports daughter in law has available hospital bed she can use. Prior Function Level of  Independence: Needs assistance Comments: Spouse reports pt requires assist with all care past few weeks including transfers to w/c but was ambulating without assistive device a few weeks ago. Communication Communication: No difficulties    Cognition  Cognition Arousal/Alertness: Lethargic Behavior During Therapy: Flat affect Overall Cognitive Status: Difficult to assess Difficult to assess due to:  (pt not talkative, just taken anxiety med)    Extremity/Trunk Assessment Right Upper Extremity Assessment RUE ROM/Strength/Tone: Deficits RUE ROM/Strength/Tone Deficits: functional weakness observed Left Upper Extremity Assessment LUE ROM/Strength/Tone: Deficits LUE ROM/Strength/Tone Deficits: functional weakness observed Right Lower Extremity Assessment RLE ROM/Strength/Tone: Deficits RLE ROM/Strength/Tone Deficits: functional weakness observed Left Lower Extremity Assessment LLE ROM/Strength/Tone: Deficits LLE ROM/Strength/Tone Deficits: functional weakness observed   Balance    End of Session PT - End of Session Equipment Utilized During Treatment: Gait belt Activity Tolerance: Patient limited by fatigue Patient left: in chair;with call bell/phone within reach;with family/visitor present Nurse Communication: Need for lift equipment  GP Functional Assessment Tool Used: clinical judgement Functional Limitation: Mobility: Walking and moving around Mobility: Walking and Moving Around Current Status ([id number]): At least 60 percent but less than 80 percent impaired, limited or restricted Mobility: Walking and Moving Around Goal Status [phone]): At least 40 percent but less than 60 percent impaired, limited or restricted   Joseph Rowland,Joseph Rowland 11/01/2012, 3:35 PM Zenovia Jarred, PT, DPT 11/01/2012 Pager: [phone]

## 2012-11-01 NOTE — Progress Notes (Signed)
[facility] PROGRESS NOTE  Chaise Mahabir ZOX:[id number] DOB: 1934-03-15 DOA: 10/31/2012 PCP: [name], FNP  Brief narrative 79 y.o. male with PMH of metastatic colon adenocarcinoma with metastasis to the brain, lung, liver and bone, on active chemotherapy (currently on treatment with FOLFIRI/Avastin is status post 1 cycle), status post stereotactic radiosurgery to 2 brain lesions, reported left ventricle mural thrombus (subsequently told not to have it by cardiology), HTN, anemia and dilated cardiomyopathy with LVEF <20% in 2013, presented to the [facility] ED on 10/31/12 with acute onset of dyspnea. Extensive workup for organic causes has not been revealing. This is likely secondary to panic attack/anxiety precipitated by not being on his when necessary Xanax for a few days. Admitted for observation, evaluation and management.  Assessment/Plan: 1. Acute dyspnea, on admission: CTA chest-negative for PE or acute process but has stable pulmonary metastasis. No clinical suspicion for pneumonia or CHF. Per spouse, patient used to be on Xanax 0.25 mg 3 times a day when necessary for anxiety/panic attacks. She has not given him the morning dose is for a few days secondary to his recent hiccups. She thinks this might have precipitated his episode of dyspnea on 5/16 a.m. Patient had a similar episode early 5/17 a.m. in hospital. Currently resolved. Continue prior Xanax when necessary regimen.? Element of dementia and sundowning. 2. Frequent falls: Awaiting PT evaluation. 3. Leukopenia: Secondary to chemotherapy. Follow CBCs 4. Anemia: Likely secondary to chemotherapy and malignancy. Follow CBCs 5. Metastatic colon cancer to liver, lungs, brain and bone: Status post stereotactic brain radiosurgery. OP followup with oncology.  6. Left frontal subdural hygroma: Progressed with mild midline shift to the right compared to imaging of 09/19/12. Discussed with Dr. Maeola Harman, neurosurgery  on 5/17 who indicated that there was no intervention needed. He also indicated that patient could be anticoagulated for other indication if needed.  7. Hypertension: Controlled. Continue home medications.  8. Dilated cardiomyopathy: Apparently no history of CHF. Continue aspirin, low-dose beta blocker and ACE inhibitors. Outpatient followup. Patient has periodic nonsustained SVT/A. fib. Not anticoagulation candidate secondary to falls. Continue current management 9. Anxiety disorder/panic attacks: When necessary anxiolytics. Management as above   Code Status: Full Family Communication: Discussed with spouse at bedside Disposition Plan: Possible discharge 5/18 pending PT and OT evaluation   Consultants:  None  Procedures:  None  Antibiotics:  None   HPI/Subjective: Patient denies complaints. "I am fine". Per spouse, had another episode of dyspnea at 3:30 this morning.  Objective: Filed Vitals:   10/31/12 1707 10/31/12 1750 10/31/12 2115 11/01/12 0510  BP: 160/55 151/87 133/78 130/73  Pulse: 64 63 84 99  Temp:  97.6 F (36.4 C) 98.3 F (36.8 C) 98.3 F (36.8 C)  TempSrc:  Axillary Axillary Axillary  Resp: 20 20 20 20   Height:  5\' 11"  (1.803 m)    Weight:  56.4 kg (124 lb 5.4 oz)  56.6 kg (124 lb 12.5 oz)  SpO2: 100% 99% 100% 100%    Intake/Output Summary (Last 24 hours) at 11/01/12 0738 Last data filed at 11/01/12 0500  Gross per 24 hour  Intake    315 ml  Output      0 ml  Net    315 ml   Filed Weights   10/31/12 1750 11/01/12 0510  Weight: 56.4 kg (124 lb 5.4 oz) 56.6 kg (124 lb 12.5 oz)    Exam:   General exam: Comfortable.  Respiratory system: Clear. No increased work of breathing. Porta cath right  anterior chest is intact  Cardiovascular system: S1 & S2 heard, RRR. No JVD, murmurs, gallops, clicks or pedal edema. Telemetry: Sinus tachycardia in the 100s to 110s. Occasional nonsustained runs of SVT versus A. Fib.  Gastrointestinal system: Abdomen is  nondistended, soft and nontender. Normal bowel sounds heard.  Central nervous system: Alert and oriented x2. No focal neurological deficits.  Extremities: Symmetric 5 x 5 power.   Data Reviewed: Basic Metabolic Panel:  Recent Labs Lab 10/31/12 1010  NA 134*  K 3.8  CL 97  CO2 28  GLUCOSE 122*  BUN 21  CREATININE 0.66  CALCIUM 9.4  MG 1.8  PHOS 2.7   Liver Function Tests:  Recent Labs Lab 10/31/12 1010  AST 38*  ALT 23  ALKPHOS 236*  BILITOT 1.0  PROT 6.7  ALBUMIN 2.2*   No results found for this basename: LIPASE, AMYLASE,  in the last 168 hours No results found for this basename: AMMONIA,  in the last 168 hours CBC:  Recent Labs Lab 10/31/12 1300 11/01/12 0440  WBC 3.2* 2.7*  HGB 13.5 11.8*  HCT 37.7* 34.3*  MCV 96.9 97.2  PLT 160 152   Cardiac Enzymes:  Recent Labs Lab 10/31/12 1010  TROPONINI <0.30   BNP (last 3 results) No results found for this basename: PROBNP,  in the last 8760 hours CBG: No results found for this basename: GLUCAP,  in the last 168 hours  No results found for this or any previous visit (from the past 240 hour(s)).   Studies: Dg Chest 2 View  10/31/2012   *RADIOLOGY REPORT*  Clinical Data: Short of breath.  Metastatic colon cancer  CHEST - 2 VIEW  Comparison: 02/28/2012.  Chest CT 09/16/2012  Findings: 2.5 cm right lower lobe nodule unchanged.  2 cm left midlung nodule unchanged.  15 mm left upper lobe nodule unchanged. These nodules are compatible with known metastatic disease.  Port-A- Cath tip remains in the SVC.  Negative for heart failure or pneumonia.  IMPRESSION: Bilateral lung nodules are stable.  No superimposed heart failure or pneumonia.   Original Report Authenticated By: Janeece Riggers, M.D.   Ct Head Wo Contrast  10/31/2012   *RADIOLOGY REPORT*  Clinical Data: Fall.  Metastatic colon cancer.  Radiation treatment for metastatic disease to the brain.  CT HEAD WITHOUT CONTRAST  Technique:  Contiguous axial images were  obtained from the base of the skull through the vertex without contrast.  Comparison: MRI 09/19/2012  Findings: Small right-sided subdural hygroma unchanged.  Increase in left frontal subdural hygroma measuring approximately 6 mm.  4 mm midline shift has increased in the interval.  No high density acute hemorrhage.  No acute infarct or mass.  No acute hemorrhage.  Mild patchy white matter hypodensity may be due to radiation or chronic microvascular ischemia.  IMPRESSION: Progression of 6 mm left frontal subdural hygroma with mild midline shift to the right.  This has progressed from the MRI of 09/19/2012.  No acute hemorrhage or infarct.  No focal mass lesion.   Original Report Authenticated By: Janeece Riggers, M.D.   Ct Angio Chest Pe W/cm &/or Wo Cm  10/31/2012   *RADIOLOGY REPORT*  Clinical Data: Short of breath.  Rule out pulmonary embolism. Metastatic colon cancer on chemotherapy  CT ANGIOGRAPHY CHEST  Technique:  Multidetector CT imaging of the chest using the standard protocol during bolus administration of intravenous contrast. Multiplanar reconstructed images including MIPs were obtained and reviewed to evaluate the vascular anatomy.  Contrast:  OMNIPAQUE IOHEXOL 350 MG/ML SOLN  Comparison: CT chest 04/01 9:14, chest x-ray today  Findings: Negative for pulmonary embolism.  There is coronary calcification.  Negative for aortic dissection. Heart size normal and there is no pericardial effusion.  Multiple lung nodules are present compatible with metastatic disease.  Overall these are approximately stable since 09/16/2012. 6 mm left upper lobe nodule.  21 x 15 mm left upper lobe nodule. 14 x 26 mm of left lower lobe nodule subpleural location.  Right lower lobe nodule 21 x 26 mm.  Negative for pneumonia.  No pleural effusion.  No mediastinal adenopathy.  Thoracic dextroscoliosis.  No fracture or bony mass lesion is identified.  Extensive metastatic disease throughout the liver which is  largely replaced by  tumor.  There is ascites in the abdomen.  IMPRESSION: Negative for pulmonary embolism.  Widespread metastatic disease in the chest and liver, unchanged.   Original Report Authenticated By: Janeece Riggers, M.D.   Dg Chest Portable 1 View  10/31/2012   *RADIOLOGY REPORT*  Clinical Data: Shortness of breath. Colon cancer with metastatic disease.  PORTABLE CHEST - 1 VIEW  Comparison: 10/31/2012 at 1031 hours  Findings: A right-sided Port-A-Cath which is unchanged and terminates at the low SVC.  Normal heart size.  Aortic atherosclerosis. No pleural effusion or pneumothorax.  No congestive failure.  No lobar consolidation.  Bilateral pulmonary nodules again identified, consistent with metastatic disease.  A skin fold over the left hemithorax.  IMPRESSION: No change since earlier in the day.  Pulmonary metastasis, without evidence of acute superimposed process.   Original Report Authenticated By: Jeronimo Greaves, M.D.     Additional labs:   Scheduled Meds: . aspirin EC  81 mg Oral Daily  . carvedilol  1.56 mg Oral Daily  . cholecalciferol  1,000 Units Oral Daily  . digoxin  0.125 mg Oral Daily  . enoxaparin (LOVENOX) injection  40 mg Subcutaneous Q24H  . lisinopril  5 mg Oral Daily  . pantoprazole  20 mg Oral Daily  . sodium chloride  3 mL Intravenous Q12H  . vitamin B-12  250 mcg Oral Daily   Continuous Infusions:   Principal Problem:   Acute dyspnea Active Problems:   Metastatic colon cancer to liver, lungs, brain & bone   Frequent falls   Leukopenia    Time spent: 25 minutes    [facility]  [facility] Pager [phone].   If 8PM-8AM, please contact night-coverage at [email or web address], password [facility] 11/01/2012, 7:38 AM  LOS: 1 day

## 2012-11-01 NOTE — Progress Notes (Addendum)
OT Cancellation Note  Patient Details Name: Joseph Rowland MRN: [id number] DOB: [birth year]   Cancelled Treatment:    Reason Eval/Treat Not Completed: Other (comment) (screen. Wife helps with all ADL (pt dependent PTA. She did not feel acute OT indicated. Has DME.))  Lennox Laity [phone] 11/01/2012, 3:38 PM

## 2012-11-01 NOTE — Progress Notes (Signed)
Patient has no urine output , bladder scan done 185cc., MD made aware. No orders for now.

## 2012-11-01 NOTE — Progress Notes (Signed)
Patient able to ambulate from chair and around the bed with 1 assist.

## 2012-11-02 LAB — CBC
Hemoglobin: 10.5 g/dL — ABNORMAL LOW (ref 13.0–17.0)
MCHC: 32.9 g/dL (ref 30.0–36.0)
RDW: 17.4 % — ABNORMAL HIGH (ref 11.5–15.5)

## 2012-11-02 MED ORDER — ALPRAZOLAM 0.25 MG PO TABS: 0.2500 mg | ORAL_TABLET | Freq: Three times a day (TID) | ORAL | Status: DC | PRN

## 2012-11-02 MED ORDER — HEPARIN SOD (PORK) LOCK FLUSH 100 UNIT/ML IV SOLN
500.0000 [IU] | INTRAVENOUS | Status: AC | PRN
  Administered 2012-11-02: 500 [IU]

## 2012-11-02 NOTE — Progress Notes (Signed)
INITIAL NUTRITION ASSESSMENT  DOCUMENTATION CODES Per approved criteria  -Severe malnutrition in the context of chronic illness -Underweight   INTERVENTION: - Patient and wife decline nutrition supplements at this time due to patient's preferences and intake of meals at baseline. Will order supplements as desired by patient.  - Patient encouraged to eat at least 50% of meals and return to home regimen after discharge.   NUTRITION DIAGNOSIS: Inadequate oral intake related to cancer on active chemotherapy as evidenced by 30% meal intake.   Goal: Patient will meet >/=90% of estimated nutrition needs  Monitor:  PO intake, weight, labs  Reason for Assessment: Malnutrition screening tool, score of 4  79 y.o. male  Admitting Dx: Acute dyspnea  ASSESSMENT: Patient with colon cancer with metastases to liver, brain, lung, and bone, on active chemotherapy. His wife reports that he hasn't eaten much in the last 1.5 weeks due to persistent hiccups and medications which altered mental status. She has been giving him Boost shakes with protein powder 3 times daily with small meals, which had resulted in some recent weight gain. She states he will not drink Ensure and only likes vanilla Boost, declines supplements at this time.   He has lost 24% of his UBW in a year, with fat tissue loss at the triceps and temples, muscle loss at the shoulders, calves, and clavicle, meeting the criteria for severe malnutrition in the context of chronic illness.   Height: Ht Readings from Last 1 Encounters:  10/31/12 5\' 11"  (1.803 m)    Weight: Wt Readings from Last 1 Encounters:  11/02/12 126 lb 5.2 oz (57.3 kg)    Ideal Body Weight: 78.2 kg  % Ideal Body Weight: 73%  Wt Readings from Last 10 Encounters:  11/02/12 126 lb 5.2 oz (57.3 kg)  10/21/12 141 lb 3.2 oz (64.048 kg)  09/23/12 132 lb 11.2 oz (60.192 kg)  09/09/12 134 lb 9.6 oz (61.054 kg)  08/26/12 130 lb 9.6 oz (59.24 kg)  08/06/12 130 lb 4.8  oz (59.104 kg)  07/23/12 130 lb (58.968 kg)  07/10/12 131 lb 1.6 oz (59.467 kg)  06/30/12 135 lb 14.4 oz (61.644 kg)  06/23/12 133 lb 11.2 oz (60.646 kg)    Usual Body Weight: 75 kg  % Usual Body Weight: 76%  BMI:  Body mass index is 17.63 kg/(m^2). Patient is underweight.   Estimated Nutritional Needs: Kcal: 1850-2000 kcal Protein: 75-85 g Fluid: >1.7 L  Skin: Stage 1 pressure ulcer buttocks, abrasions left knee and shoulder  Diet Order: Cardiac, 30%  EDUCATION NEEDS: -No education needs identified at this time   Intake/Output Summary (Last 24 hours) at 11/02/12 1417 Last data filed at 11/02/12 1049  Gross per 24 hour  Intake     50 ml  Output    650 ml  Net   -600 ml    Last BM: PTA   Labs:   Recent Labs Lab 10/31/12 1010  NA 134*  K 3.8  CL 97  CO2 28  BUN 21  CREATININE 0.66  CALCIUM 9.4  MG 1.8  PHOS 2.7  GLUCOSE 122*    CBG (last 3)  No results found for this basename: GLUCAP,  in the last 72 hours  Scheduled Meds: . aspirin EC  81 mg Oral Daily  . carvedilol  1.56 mg Oral Daily  . cholecalciferol  1,000 Units Oral Daily  . digoxin  0.125 mg Oral Daily  . enoxaparin (LOVENOX) injection  40 mg Subcutaneous Q24H  .  lisinopril  5 mg Oral Daily  . pantoprazole  20 mg Oral Daily  . sodium chloride  3 mL Intravenous Q12H  . vitamin B-12  250 mcg Oral Daily    Continuous Infusions:   Past Medical History  Diagnosis Date  . Lung mass   . Liver cancer   . 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 Brain cancer     lesions in occipital lobes,b/l suspicious mets  . Lung cancer     b/l multifocal pulmonary mets  . Hypertension   . Anemia   . History of radiation therapy 12/07/11    SRS single fraction palliative 2 intracranial lesions  . History of chemotherapy 01/07/12    FOLFAX/AVASTIN s/p 3 cycles   . Mitral regurgitation   . Dilated  cardiomyopathy   . Shortness of breath     on exertion  . Chemotherapy induced neutropenia 05/20/2012    Past Surgical History  Procedure Laterality Date  . Hernia repair    . Right port a cath      power port right subclavian  . Colonoscopy  12/06/2011    Procedure: COLONOSCOPY;  Surgeon: Louis Meckel, MD;  Location: [facility];  Service: Endoscopy;  Laterality: N/A;  . Colonoscopy  12/06/2011    Procedure: COLONOSCOPY;  Surgeon: Louis Meckel, MD;  Location: [facility];  Service: Endoscopy;  Laterality: N/A;    Linnell Fulling, RD, LDN Pager #: [phone] After-Hours Pager #: [phone]

## 2012-11-02 NOTE — Care Management Note (Signed)
    Page 1 of 1   11/02/2012     5:47:18 PM   CARE MANAGEMENT NOTE 11/02/2012  Patient:  Hillyard,Jerremy   Account Number:  [id number]  Date Initiated:  11/02/2012  Documentation initiated by:  Lanier Clam  Subjective/Objective Assessment:   ADMITTED W/SOB.     Action/Plan:   FROM HOME   Anticipated DC Date:  11/03/2012   Anticipated DC Plan:  HOME W HOME HEALTH SERVICES      DC Planning Services  CM consult      Choice offered to / List presented to:  C-1 Patient        HH arranged  HH-2 PT      [facility] agency  [facility]   Status of service:  In process, will continue to follow Medicare Important Message given?   (If response is "NO", the following Medicare IM given date fields will be blank) Date Medicare IM given:   Date Additional Medicare IM given:    Discharge Disposition:    Per UR Regulation:    If discussed at Long Length of Stay Meetings, dates discussed:    Comments:  11/02/12 Lorayne Getchell RN,BSN NCM WEEKEND [phone] [facility] CHOSEN FOR HHPT.TC [facility] JAIME AWARE & FOLLOWING.

## 2012-11-02 NOTE — Plan of Care (Signed)
Problem: Discharge Progression Outcomes Goal: Able to self administer respiratory meds Outcome: Completed/Met Date Met:  11/02/12 Care taker- wife able to administer Goal: Independent ADLs or Home Health Care Outcome: Adequate for Discharge With care giver assistance at home

## 2012-11-02 NOTE — Progress Notes (Signed)
Decreasd urine output noted from foley cath--75cc recorded.

## 2012-11-02 NOTE — Progress Notes (Signed)
Patient unable to void after foley removal, bladder scan 79, Dr. Waymon Amato notified. Will continue to assess patient.

## 2012-11-02 NOTE — Progress Notes (Addendum)
Patient has not voided, bladder scanned completed--300-325 cc scanned. Actual amount from foley was 225cc of dark amber urine. PO intake encouraged, IV fluids are NS @100cc /hr. Tama Gander, RN BSN. Called Nurse extender oncall to obtain order for foley.

## 2012-11-02 NOTE — Progress Notes (Signed)
Patient discharged home with wife/daughter. Discharge instructions given and explain to patient/wife and she verbalized understanding,. Patient denies any pain/distress; wife requested xanax prior to discharge because of the driving distance to get home. Skin intact, scabs over abrasion at home prior to admission, and redness over buttocks, no open skin. Accompanied home by daughter/wife, transported to the car by staff via wheelchair.

## 2012-11-02 NOTE — Discharge Summary (Signed)
Physician Discharge Summary  Joseph Rowland XBJ:[id number] DOB: 18-Nov-1933 DOA: 10/31/2012  PCP: [name], FNP  Admit date: 10/31/2012 Discharge date: 11/02/2012  Time spent: Less than 30 minutes  Recommendations for Outpatient Follow-up:  1. Dr. Si Gaul, Oncology: keep up prior appointment on 11/04/2012 for lab work and follow up. 2. Dr. [name], PCP  Discharge Diagnoses:  Principal Problem:   Acute dyspnea Active Problems:   Metastatic colon cancer to liver, lungs, brain & bone   Frequent falls   Leukopenia   Anemia   Discharge Condition: Improved & Stable  Diet recommendation: Regular diet.  Filed Weights   10/31/12 1750 11/01/12 0510 11/02/12 0500  Weight: 56.4 kg (124 lb 5.4 oz) 56.6 kg (124 lb 12.5 oz) 57.3 kg (126 lb 5.2 oz)    History of present illness:  79 y.o. male with PMH of metastatic colon adenocarcinoma with metastasis to the brain, lung, liver and bone, on active chemotherapy (currently on treatment with FOLFIRI/Avastin is status post 1 cycle), status post stereotactic radiosurgery to 2 brain lesions, reported left ventricle mural thrombus (subsequently told not to have it by cardiology), HTN, anemia and dilated cardiomyopathy with LVEF <20% in 2013, presented to the [facility] ED on 10/31/12 with acute onset of dyspnea. Admitted for observation, evaluation and management.  Hospital Course:  1. Acute dyspnea, on admission: CTA chest-negative for PE or acute process but has stable pulmonary metastasis. No clinical suspicion for pneumonia or CHF. Per spouse, patient used to be on Xanax 0.25 mg 3 times a day when necessary for anxiety/panic attacks. She has not given him the morning doses for a few days secondary to his recent hiccups. She thinks this might have precipitated his episode of dyspnea on 5/16 a.m. Patient had a similar episode early 5/17 a.m. in hospital. Currently resolved. Continue prior Xanax when necessary  regimen.? Element of dementia and sundowning. He has h/o episodes of dyspnea related to anxiety almost daily b/w 4-6 pm for the last 6 months associated with repeated sitting up and laying down in bed. These episodes either resolve spontaneously or with Xanax. 2. Frequent falls: PT evaluated and recommended home health PT. Spouse provides 24/7 supervision. 3. Leukopenia: Secondary to chemotherapy. Will follow with Cancer center for labs on 5/20 4. Anemia/Pancytopenia: Likely secondary to chemotherapy and malignancy. Follow CBCs 5/20. 5. Metastatic colon cancer to liver, lungs, brain and bone: Status post stereotactic brain radiosurgery. OP followup with oncology.  6. Left frontal subdural hygroma: Progressed with mild midline shift to the right compared to imaging of 09/19/12. Discussed with Dr. Maeola Harman, neurosurgery on 5/17 who indicated that there was no intervention needed. He also indicated that patient could be anticoagulated for other indications if needed.  7. Hypertension: Controlled. Continue home medications.  8. Dilated cardiomyopathy: Apparently no history of CHF. Continue aspirin, low-dose beta blocker and ACE inhibitors. Outpatient followup. Patient has periodic nonsustained SVT/A. fib. Not anticoagulation candidate secondary to falls. Continue current management. Consider OP Cardiology follow up if any further aggressive course of treatment considered appropriate. 9. Anxiety disorder/panic attacks: When necessary anxiolytics. Management as above 10. ? Urinary difficulties: patient did not void for prolonged periods on 5/17. However bladder scan showed only maximum of 300-325 mls of urine and patient was catheterized overnight yielding only 225 ml of urine. Per spouse, patient even at home he voids only 1-2 times during day and night and she indicated that this was usual for him. She denied h/o prostate problems. Catheter was removed  and spouse was advised that he may need OP Urology  consultation if this truly becomes a problem.   Procedures:  Foley catheter - removed    Consultations:  None   Discharge Exam:  Complaints: Back to baseline per spouse. Patient denies complaints. No SOB. Per spouse, even at home at baseline urinates infrequently.  Filed Vitals:   11/02/12 0500 11/02/12 0513 11/02/12 1043 11/02/12 1347  BP:  128/52 150/68 118/74  Pulse:  56 69 76  Temp:  97.4 F (36.3 C)  97.7 F (36.5 C)  TempSrc:  Oral  Oral  Resp:  20  18  Height:      Weight: 57.3 kg (126 lb 5.2 oz)     SpO2:  100%  100%    General exam: Comfortable.  Respiratory system: Clear. No increased work of breathing. Porta cath right anterior chest is intact  Cardiovascular system: S1 & S2 heard, RRR. No JVD, murmurs, gallops, clicks or pedal edema. Telemetry: Sinus bradycardia in mid 50's-SR. Occasional runs of non sustained SVT Vs Afib. Gastrointestinal system: Abdomen is nondistended, soft and nontender. Normal bowel sounds heard.  Central nervous system: Alert and oriented x2. No focal neurological deficits.  Extremities: Symmetric 5 x 5 power.  Discharge Instructions      Discharge Orders   Future Appointments Provider Department Dept Phone   11/04/2012 9:15 AM Windell Hummingbird [facility] MEDICAL ONCOLOGY [phone]   11/04/2012 9:45 AM Si Gaul, MD [facility] MEDICAL ONCOLOGY [phone]   11/04/2012 10:45 AM [facility] MED[facility] [phone]   11/06/2012 1:30 PM [facility] Flush Nurse [facility] MEDICAL ONCOLOGY [phone]   11/11/2012 11:30 AM Mauri [facility] [phone]   11/18/2012 11:30 AM Dava Najjar [facility] MEDICAL ONCOLOGY [phone]   12/26/2012 12:00 PM [facility] AT [address] [phone]   Patient to arrive 15 minutes prior to appointment time.   12/29/2012 11:00 AM  Jonna Coup, MD [facility] RADIATION ONCOLOGY [phone]   Future Orders Complete By Expires     Call MD for:  difficulty breathing, headache or visual disturbances  As directed     Call MD for:  extreme fatigue  As directed     Call MD for:  persistant dizziness or light-headedness  As directed     Call MD for:  temperature >100.4  As directed     Call MD for:  As directed     Comments:      Difficulty urinating.    Diet general  As directed     Increase activity slowly  As directed         Medication List    STOP taking these medications       baclofen 20 MG tablet  Commonly known as:  LIORESAL     loratadine 10 MG tablet  Commonly known as:  CLARITIN      TAKE these medications       ALPRAZolam 0.25 MG tablet  Commonly known as:  XANAX  Take 1 tablet (0.25 mg total) by mouth 3 (three) times daily as needed for sleep or anxiety.     aspirin EC 81 MG tablet  Take 81 mg by mouth daily.     carvedilol 3.125 MG tablet  Commonly known as:  COREG  Take 1.56 mg by mouth daily.     cholecalciferol  1000 UNITS tablet  Commonly known as:  VITAMIN D  Take 1,000 Units by mouth daily.     digoxin 0.125 MG tablet  Commonly known as:  LANOXIN  Take 0.125 mg by mouth daily.     lansoprazole 15 MG capsule  Commonly known as:  PREVACID  Take 15 mg by mouth daily.     lisinopril 5 MG tablet  Commonly known as:  PRINIVIL,ZESTRIL  Take 5 mg by mouth daily.     PRESCRIPTION MEDICATION  Inject into the vein every 14 (fourteen) days. Avastin, Irinotecan, Leucovorin, Flurouracil (FOLFIRI)     prochlorperazine 10 MG tablet  Commonly known as:  COMPAZINE  10 mg every 6 (six) hours as needed.     vitamin B-12 250 MCG tablet  Commonly known as:  CYANOCOBALAMIN  Take 250 mcg by mouth daily.          The results of significant diagnostics from this hospitalization (including imaging, microbiology, ancillary and laboratory) are listed below for reference.     Significant Diagnostic Studies: Dg Chest 2 View  10/31/2012   *RADIOLOGY REPORT*  Clinical Data: Short of breath.  Metastatic colon cancer  CHEST - 2 VIEW  Comparison: 02/28/2012.  Chest CT 09/16/2012  Findings: 2.5 cm right lower lobe nodule unchanged.  2 cm left midlung nodule unchanged.  15 mm left upper lobe nodule unchanged. These nodules are compatible with known metastatic disease.  Port-A- Cath tip remains in the SVC.  Negative for heart failure or pneumonia.  IMPRESSION: Bilateral lung nodules are stable.  No superimposed heart failure or pneumonia.   Original Report Authenticated By: Janeece Riggers, M.D.   Ct Head Wo Contrast  10/31/2012   *RADIOLOGY REPORT*  Clinical Data: Fall.  Metastatic colon cancer.  Radiation treatment for metastatic disease to the brain.  CT HEAD WITHOUT CONTRAST  Technique:  Contiguous axial images were obtained from the base of the skull through the vertex without contrast.  Comparison: MRI 09/19/2012  Findings: Small right-sided subdural hygroma unchanged.  Increase in left frontal subdural hygroma measuring approximately 6 mm.  4 mm midline shift has increased in the interval.  No high density acute hemorrhage.  No acute infarct or mass.  No acute hemorrhage.  Mild patchy white matter hypodensity may be due to radiation or chronic microvascular ischemia.  IMPRESSION: Progression of 6 mm left frontal subdural hygroma with mild midline shift to the right.  This has progressed from the MRI of 09/19/2012.  No acute hemorrhage or infarct.  No focal mass lesion.   Original Report Authenticated By: Janeece Riggers, M.D.   Ct Angio Chest Pe W/cm &/or Wo Cm  10/31/2012   *RADIOLOGY REPORT*  Clinical Data: Short of breath.  Rule out pulmonary embolism. Metastatic colon cancer on chemotherapy  CT ANGIOGRAPHY CHEST  Technique:  Multidetector CT imaging of the chest using the standard protocol during bolus administration of intravenous contrast. Multiplanar reconstructed images  including MIPs were obtained and reviewed to evaluate the vascular anatomy.  Contrast: OMNIPAQUE IOHEXOL 350 MG/ML SOLN  Comparison: CT chest 04/01 9:14, chest x-ray today  Findings: Negative for pulmonary embolism.  There is coronary calcification.  Negative for aortic dissection. Heart size normal and there is no pericardial effusion.  Multiple lung nodules are present compatible with metastatic disease.  Overall these are approximately stable since 09/16/2012. 6 mm left upper lobe nodule.  21 x 15 mm left upper lobe nodule. 14 x 26 mm of left lower lobe nodule subpleural location.  Right lower lobe nodule 21 x 26 mm.  Negative for pneumonia.  No pleural effusion.  No mediastinal adenopathy.  Thoracic dextroscoliosis.  No fracture or bony mass lesion is identified.  Extensive metastatic disease throughout the liver which is  largely replaced by tumor.  There is ascites in the abdomen.  IMPRESSION: Negative for pulmonary embolism.  Widespread metastatic disease in the chest and liver, unchanged.   Original Report Authenticated By: Janeece Riggers, M.D.   US Paracentesis  10/27/2012   *RADIOLOGY REPORT*  Clinical Data: History of metastatic colon cancer, abdominal distension secondary to ascites.  Request for therapeutic paracentesis  ULTRASOUND GUIDED PARACENTESIS  Comparison:  None  An ultrasound guided paracentesis was thoroughly discussed with the patient and questions answered.  The benefits, risks, alternatives and complications were also discussed.  The patient understands and wishes to proceed with the procedure.  Written consent was obtained.  Ultrasound was performed to localize and mark an adequate pocket of fluid in the left lower quadrant of the abdomen.  The area was then prepped and draped in the normal sterile fashion.  1% Lidocaine was used for local anesthesia.  Under ultrasound guidance a 19 gauge Yueh catheter was introduced.  Paracentesis was performed.  The catheter was removed and a  dressing applied.  Complications:  None  Findings:  A total of approximately 2.3L of clear yellow fluid was removed.  A fluid sample was not sent for laboratory analysis.  IMPRESSION: Successful ultrasound guided paracentesis yielding 2.3 liters of ascites.  Read by Brayton El PA-C   Original Report Authenticated By: Tacey Ruiz, MD   Dg Chest Portable 1 View  10/31/2012   *RADIOLOGY REPORT*  Clinical Data: Shortness of breath. Colon cancer with metastatic disease.  PORTABLE CHEST - 1 VIEW  Comparison: 10/31/2012 at 1031 hours  Findings: A right-sided Port-A-Cath which is unchanged and terminates at the low SVC.  Normal heart size.  Aortic atherosclerosis. No pleural effusion or pneumothorax.  No congestive failure.  No lobar consolidation.  Bilateral pulmonary nodules again identified, consistent with metastatic disease.  A skin fold over the left hemithorax.  IMPRESSION: No change since earlier in the day.  Pulmonary metastasis, without evidence of acute superimposed process.   Original Report Authenticated By: Jeronimo Greaves, M.D.    Microbiology: Recent Results (from the past 240 hour(s))  CULTURE, BLOOD (ROUTINE X 2)     Status: None   Collection Time    10/31/12 11:37 AM      Result Value Range Status   Specimen Description BLOOD LEFT ARM   Final   Special Requests BOTTLES DRAWN AEROBIC AND ANAEROBIC 7 CC   Final   Culture  Setup Time 10/31/2012 14:37   Final   Culture     Final   Value:        BLOOD CULTURE RECEIVED NO GROWTH TO DATE CULTURE WILL BE HELD FOR 5 DAYS BEFORE ISSUING A FINAL NEGATIVE REPORT   Report Status PENDING   Incomplete  CULTURE, BLOOD (ROUTINE X 2)     Status: None   Collection Time    10/31/12 11:45 AM      Result Value Range Status   Specimen Description BLOOD RIGHT ARM   Final   Special Requests BOTTLES DRAWN AEROBIC AND ANAEROBIC 8 CC   Final   Culture  Setup Time 10/31/2012 14:38   Final   Culture     Final   Value:        BLOOD CULTURE  RECEIVED NO GROWTH  TO DATE CULTURE WILL BE HELD FOR 5 DAYS BEFORE ISSUING A FINAL NEGATIVE REPORT   Report Status PENDING   Incomplete  URINE CULTURE     Status: None   Collection Time    10/31/12 12:53 PM      Result Value Range Status   Specimen Description URINE, CATHETERIZED   Final   Special Requests NONE   Final   Culture  Setup Time 10/31/2012 18:39   Final   Colony Count NO GROWTH   Final   Culture NO GROWTH   Final   Report Status 11/01/2012 FINAL   Final     Labs: Basic Metabolic Panel:  Recent Labs Lab 10/31/12 1010  NA 134*  K 3.8  CL 97  CO2 28  GLUCOSE 122*  BUN 21  CREATININE 0.66  CALCIUM 9.4  MG 1.8  PHOS 2.7   Liver Function Tests:  Recent Labs Lab 10/31/12 1010  AST 38*  ALT 23  ALKPHOS 236*  BILITOT 1.0  PROT 6.7  ALBUMIN 2.2*   No results found for this basename: LIPASE, AMYLASE,  in the last 168 hours No results found for this basename: AMMONIA,  in the last 168 hours CBC:  Recent Labs Lab 10/31/12 1300 11/01/12 0440 11/02/12 0430  WBC 3.2* 2.7* 2.0*  HGB 13.5 11.8* 10.5*  HCT 37.7* 34.3* 31.9*  MCV 96.9 97.2 98.8  PLT 160 152 123*   Cardiac Enzymes:  Recent Labs Lab 10/31/12 1010  TROPONINI <0.30   BNP: BNP (last 3 results) No results found for this basename: PROBNP,  in the last 8760 hours CBG: No results found for this basename: GLUCAP,  in the last 168 hours  Additional labs:    Signed:  HONGALGI,ANAND  [facility] 11/02/2012, 4:38 PM

## 2012-11-04 ENCOUNTER — Telehealth: Payer: Self-pay | Admitting: *Deleted

## 2012-11-04 ENCOUNTER — Ambulatory Visit (HOSPITAL_BASED_OUTPATIENT_CLINIC_OR_DEPARTMENT_OTHER): Payer: Medicare Other

## 2012-11-04 ENCOUNTER — Other Ambulatory Visit (HOSPITAL_BASED_OUTPATIENT_CLINIC_OR_DEPARTMENT_OTHER): Payer: Medicare Other | Admitting: Lab

## 2012-11-04 ENCOUNTER — Ambulatory Visit (HOSPITAL_BASED_OUTPATIENT_CLINIC_OR_DEPARTMENT_OTHER): Payer: Medicare Other | Admitting: Internal Medicine

## 2012-11-04 ENCOUNTER — Encounter: Payer: Self-pay | Admitting: Internal Medicine

## 2012-11-04 ENCOUNTER — Other Ambulatory Visit: Payer: Medicare Other | Admitting: Lab

## 2012-11-04 ENCOUNTER — Ambulatory Visit: Payer: Medicare Other

## 2012-11-04 ENCOUNTER — Telehealth: Payer: Self-pay | Admitting: Internal Medicine

## 2012-11-04 VITALS — BP 146/88 | HR 81 | Temp 97.0°F | Resp 18 | Ht 71.0 in | Wt 134.3 lb

## 2012-11-04 DIAGNOSIS — D701 Agranulocytosis secondary to cancer chemotherapy: Secondary | ICD-10-CM

## 2012-11-04 DIAGNOSIS — C189 Malignant neoplasm of colon, unspecified: Secondary | ICD-10-CM

## 2012-11-04 DIAGNOSIS — D709 Neutropenia, unspecified: Secondary | ICD-10-CM

## 2012-11-04 DIAGNOSIS — C7951 Secondary malignant neoplasm of bone: Secondary | ICD-10-CM

## 2012-11-04 DIAGNOSIS — C180 Malignant neoplasm of cecum: Secondary | ICD-10-CM

## 2012-11-04 DIAGNOSIS — C7949 Secondary malignant neoplasm of other parts of nervous system: Secondary | ICD-10-CM

## 2012-11-04 DIAGNOSIS — C787 Secondary malignant neoplasm of liver and intrahepatic bile duct: Secondary | ICD-10-CM

## 2012-11-04 DIAGNOSIS — C7931 Secondary malignant neoplasm of brain: Secondary | ICD-10-CM

## 2012-11-04 DIAGNOSIS — R188 Other ascites: Secondary | ICD-10-CM

## 2012-11-04 LAB — CBC WITH DIFFERENTIAL/PLATELET
Eosinophils Absolute: 0.1 10*3/uL (ref 0.0–0.5)
MONO#: 0.6 10*3/uL (ref 0.1–0.9)
NEUT#: 0.2 10*3/uL — CL (ref 1.5–6.5)
Platelets: 145 10*3/uL (ref 140–400)
RBC: 3.9 10*6/uL — ABNORMAL LOW (ref 4.20–5.82)
RDW: 18.2 % — ABNORMAL HIGH (ref 11.0–14.6)
WBC: 2 10*3/uL — ABNORMAL LOW (ref 4.0–10.3)
lymph#: 1 10*3/uL (ref 0.9–3.3)
nRBC: 0 % (ref 0–0)

## 2012-11-04 MED ORDER — PEGFILGRASTIM INJECTION 6 MG/0.6ML
6.0000 mg | Freq: Once | SUBCUTANEOUS | Status: AC
  Administered 2012-11-04: 6 mg via SUBCUTANEOUS
  Filled 2012-11-04: qty 0.6

## 2012-11-04 NOTE — Telephone Encounter (Signed)
erroneous

## 2012-11-04 NOTE — Progress Notes (Signed)
Per Dr Donnald Garre, no chemo today, pt will receive neulasta injection only.  SLJ

## 2012-11-04 NOTE — Patient Instructions (Signed)
Neulasta injection today for neutropenia. I will delay the next cycle of chemotherapy by 2 weeks. Followup visit at that time

## 2012-11-04 NOTE — Telephone Encounter (Signed)
Per staff message and POF I have scheduled appts.  JMW  

## 2012-11-04 NOTE — Telephone Encounter (Signed)
gave pt appt for May and June 2014 lab, chemo and MD

## 2012-11-04 NOTE — Progress Notes (Signed)
[facility] [phone]   [phone]  OFFICE PROGRESS NOTE  [name], FNP [address] [place] [address]  DIAGNOSIS:  1) Metastatic colon adenocarcinoma (KRAS mutation) with metastatic lesion to the brain, lung, liver and bone diagnosed in May of 2013.  2) left ventricular mural thrombus incidentally diagnosed on recent CT scan of the chest on 02/28/2012.  PRIOR THERAPY:  1) Status post stereotactic radiosurgery to 2 brain lesions.  2) Systemic chemotherapy with FOLFOX/Avastin status post 11 cycles.  3) continuous infusion 5-FU with Avastin every 2 weeks. First cycle on 06/24/2012. Status post 5 cycles discontinued today secondary to disease progression.  CURRENT THERAPY:  1) Chemotherapy with FOLFIRI/Avastin every 2 weeks, status post 2 cycles.  2) Coumadin 5 mg by mouth daily for the recently diagnosed left ventricular mural thrombus.  INTERVAL HISTORY: Waylon Hershey 79 y.o. male returns to the clinic today for followup visit accompanied his wife and daughter. The patient is still complaining of increasing fatigue and generalized weakness. He underwent ultrasound-guided paracentesis on 10/27/2012 with drainage of 2.3 L of clear yellow fluid. He felt much better after the paracentesis with improvement in his shortness of breath. He is has lack of appetite and lost few pounds. The patient is here today for evaluation before starting the next cycle of her systemic chemotherapy with FOLFIRI/Avastin. He denied having any significant fever or chills. He denied having any nausea or vomiting.  MEDICAL HISTORY: Past Medical History  Diagnosis Date  . Lung mass   . Liver cancer   . metastatic adenocarcinoma 11/14/11    liver bx=metastatic [id number] with tumor necrosis kras  mutation and involving brain,lung,and bones  . Brain cancer     lesions in occipital lobes,b/l suspicious mets  . Lung cancer     b/l multifocal  pulmonary mets  . Hypertension   . Anemia   . History of radiation therapy 12/07/11    SRS single fraction palliative 2 intracranial lesions  . History of chemotherapy 01/07/12    FOLFAX/AVASTIN s/p 3 cycles   . Mitral regurgitation   . Dilated cardiomyopathy   . Shortness of breath     on exertion  . Chemotherapy induced neutropenia 05/20/2012    ALLERGIES:  has No Known Allergies.  MEDICATIONS:  Current Outpatient Prescriptions  Medication Sig Dispense Refill  . ALPRAZolam (XANAX) 0.25 MG tablet Take 1 tablet (0.25 mg total) by mouth 3 (three) times daily as needed for sleep or anxiety.      Marland Kitchen aspirin EC 81 MG tablet Take 81 mg by mouth daily.      . carvedilol (COREG) 3.125 MG tablet Take 1.56 mg by mouth daily.       . cholecalciferol (VITAMIN D) 1000 UNITS tablet Take 1,000 Units by mouth daily.      . digoxin (LANOXIN) 0.125 MG tablet Take 0.125 mg by mouth daily.       . lansoprazole (PREVACID) 15 MG capsule Take 15 mg by mouth daily.      Marland Kitchen lisinopril (PRINIVIL,ZESTRIL) 5 MG tablet Take 5 mg by mouth daily.       Marland Kitchen PRESCRIPTION MEDICATION Inject into the vein every 14 (fourteen) days. Avastin, Irinotecan, Leucovorin, Flurouracil (FOLFIRI)      . prochlorperazine (COMPAZINE) 10 MG tablet 10 mg every 6 (six) hours as needed.       . vitamin B-12 (CYANOCOBALAMIN) 250 MCG tablet Take 250 mcg by mouth daily.      Marland Kitchen  baclofen (LIORESAL) 20 MG tablet       . dicyclomine (BENTYL) 20 MG tablet        No current facility-administered medications for this visit.   Facility-Administered Medications Ordered in Other Visits  Medication Dose Route Frequency Provider Last Rate Last Dose  . 0.9 %  sodium chloride infusion   Intravenous Once Si Gaul, MD      . sodium chloride 0.9 % injection 10 mL  10 mL Intracatheter PRN Si Gaul, MD      . sodium chloride 0.9 % injection 10 mL  10 mL Intracatheter PRN Si Gaul, MD        SURGICAL HISTORY:  Past Surgical History    Procedure Laterality Date  . Hernia repair    . Right port a cath      power port right subclavian  . Colonoscopy  12/06/2011    Procedure: COLONOSCOPY;  Surgeon: Louis Meckel, MD;  Location: [facility];  Service: Endoscopy;  Laterality: N/A;  . Colonoscopy  12/06/2011    Procedure: COLONOSCOPY;  Surgeon: Louis Meckel, MD;  Location: [facility];  Service: Endoscopy;  Laterality: N/A;    REVIEW OF SYSTEMS:  A comprehensive review of systems was negative except for: Constitutional: positive for anorexia, fatigue and weight loss Respiratory: positive for dyspnea on exertion Musculoskeletal: positive for muscle weakness   PHYSICAL EXAMINATION: General appearance: alert, cooperative, fatigued and no distress Head: Normocephalic, without obvious abnormality, atraumatic Neck: no adenopathy Lymph nodes: Cervical, supraclavicular, and axillary nodes normal. Resp: clear to auscultation bilaterally Cardio: regular rate and rhythm, S1, S2 normal, no murmur, click, rub or gallop GI: Mildly distended but nontender to palpation Extremities: extremities normal, atraumatic, no cyanosis or edema  ECOG PERFORMANCE STATUS: 2 - Symptomatic, <50% confined to bed  Blood pressure 146/88, pulse 81, temperature 97 F (36.1 C), temperature source Oral, resp. rate 18, height 5\' 11"  (1.803 m), weight 134 lb 4.8 oz (60.918 kg).  LABORATORY DATA: Lab Results  Component Value Date   WBC 2.0* 11/04/2012   HGB 12.8* 11/04/2012   HCT 38.5 11/04/2012   MCV 98.7* 11/04/2012   PLT 145 11/04/2012      Chemistry      Component Value Date/Time   NA 134* 10/31/2012 1010   NA 138 10/21/2012 0939   K 3.8 10/31/2012 1010   K 4.3 10/21/2012 0939   CL 97 10/31/2012 1010   CL 102 10/21/2012 0939   CO2 28 10/31/2012 1010   CO2 27 10/21/2012 0939   BUN 21 10/31/2012 1010   BUN 25.2 10/21/2012 0939   CREATININE 0.66 10/31/2012 1010   CREATININE 0.7 10/21/2012 0939      Component Value Date/Time   CALCIUM 9.4 10/31/2012 1010    CALCIUM 9.2 10/21/2012 0939   ALKPHOS 236* 10/31/2012 1010   ALKPHOS 418* 10/21/2012 0939   AST 38* 10/31/2012 1010   AST 59* 10/21/2012 0939   ALT 23 10/31/2012 1010   ALT 35 10/21/2012 0939   BILITOT 1.0 10/31/2012 1010   BILITOT 1.17 10/21/2012 0939       RADIOGRAPHIC STUDIES: Dg Chest 2 View  10/31/2012   *RADIOLOGY REPORT*  Clinical Data: Short of breath.  Metastatic colon cancer  CHEST - 2 VIEW  Comparison: 02/28/2012.  Chest CT 09/16/2012  Findings: 2.5 cm right lower lobe nodule unchanged.  2 cm left midlung nodule unchanged.  15 mm left upper lobe nodule unchanged. These nodules are compatible with known metastatic disease.  Port-A- Cath  tip remains in the SVC.  Negative for heart failure or pneumonia.  IMPRESSION: Bilateral lung nodules are stable.  No superimposed heart failure or pneumonia.   Original Report Authenticated By: Janeece Riggers, M.D.   Ct Head Wo Contrast  10/31/2012   *RADIOLOGY REPORT*  Clinical Data: Fall.  Metastatic colon cancer.  Radiation treatment for metastatic disease to the brain.  CT HEAD WITHOUT CONTRAST  Technique:  Contiguous axial images were obtained from the base of the skull through the vertex without contrast.  Comparison: MRI 09/19/2012  Findings: Small right-sided subdural hygroma unchanged.  Increase in left frontal subdural hygroma measuring approximately 6 mm.  4 mm midline shift has increased in the interval.  No high density acute hemorrhage.  No acute infarct or mass.  No acute hemorrhage.  Mild patchy white matter hypodensity may be due to radiation or chronic microvascular ischemia.  IMPRESSION: Progression of 6 mm left frontal subdural hygroma with mild midline shift to the right.  This has progressed from the MRI of 09/19/2012.  No acute hemorrhage or infarct.  No focal mass lesion.   Original Report Authenticated By: Janeece Riggers, M.D.   Ct Angio Chest Pe W/cm &/or Wo Cm  10/31/2012   *RADIOLOGY REPORT*  Clinical Data: Short of breath.  Rule out  pulmonary embolism. Metastatic colon cancer on chemotherapy  CT ANGIOGRAPHY CHEST  Technique:  Multidetector CT imaging of the chest using the standard protocol during bolus administration of intravenous contrast. Multiplanar reconstructed images including MIPs were obtained and reviewed to evaluate the vascular anatomy.  Contrast: OMNIPAQUE IOHEXOL 350 MG/ML SOLN  Comparison: CT chest 04/01 9:14, chest x-ray today  Findings: Negative for pulmonary embolism.  There is coronary calcification.  Negative for aortic dissection. Heart size normal and there is no pericardial effusion.  Multiple lung nodules are present compatible with metastatic disease.  Overall these are approximately stable since 09/16/2012. 6 mm left upper lobe nodule.  21 x 15 mm left upper lobe nodule. 14 x 26 mm of left lower lobe nodule subpleural location.  Right lower lobe nodule 21 x 26 mm.  Negative for pneumonia.  No pleural effusion.  No mediastinal adenopathy.  Thoracic dextroscoliosis.  No fracture or bony mass lesion is identified.  Extensive metastatic disease throughout the liver which is  largely replaced by tumor.  There is ascites in the abdomen.  IMPRESSION: Negative for pulmonary embolism.  Widespread metastatic disease in the chest and liver, unchanged.   Original Report Authenticated By: Janeece Riggers, M.D.   US Paracentesis  10/27/2012   *RADIOLOGY REPORT*  Clinical Data: History of metastatic colon cancer, abdominal distension secondary to ascites.  Request for therapeutic paracentesis  ULTRASOUND GUIDED PARACENTESIS  Comparison:  None  An ultrasound guided paracentesis was thoroughly discussed with the patient and questions answered.  The benefits, risks, alternatives and complications were also discussed.  The patient understands and wishes to proceed with the procedure.  Written consent was obtained.  Ultrasound was performed to localize and mark an adequate pocket of fluid in the left lower quadrant of the abdomen.   The area was then prepped and draped in the normal sterile fashion.  1% Lidocaine was used for local anesthesia.  Under ultrasound guidance a 19 gauge Yueh catheter was introduced.  Paracentesis was performed.  The catheter was removed and a dressing applied.  Complications:  None  Findings:  A total of approximately 2.3L of clear yellow fluid was removed.  A fluid sample was not sent  for laboratory analysis.  IMPRESSION: Successful ultrasound guided paracentesis yielding 2.3 liters of ascites.  Read by Brayton El PA-C   Original Report Authenticated By: Tacey Ruiz, MD   Dg Chest Portable 1 View  10/31/2012   *RADIOLOGY REPORT*  Clinical Data: Shortness of breath. Colon cancer with metastatic disease.  PORTABLE CHEST - 1 VIEW  Comparison: 10/31/2012 at 1031 hours  Findings: A right-sided Port-A-Cath which is unchanged and terminates at the low SVC.  Normal heart size.  Aortic atherosclerosis. No pleural effusion or pneumothorax.  No congestive failure.  No lobar consolidation.  Bilateral pulmonary nodules again identified, consistent with metastatic disease.  A skin fold over the left hemithorax.  IMPRESSION: No change since earlier in the day.  Pulmonary metastasis, without evidence of acute superimposed process.   Original Report Authenticated By: Jeronimo Greaves, M.D.    ASSESSMENT: This is a very pleasant 79 years old with metastatic colon adenocarcinoma currently undergoing systemic chemotherapy with FOLFIRI/Avastin is status post 2 cycles. The patient is still complaining of fatigue and weakness as well as lack of appetite and weight loss. His absolute neutrophil count are low today.   PLAN: I have a lengthy discussion with the patient and his family. I recommended for him to delay the start of the next cycle of chemotherapy about 2 weeks to get more time to recover from the previous treatment and get more strength. I will arrange for the patient to receive Neulasta injection today for the  neutropenia. He continues to have recurrent ascites. I recommended for the patient to call immediately if you start having a lot of abdominal distention as we can arrange for him repeat ultrasound guided paracentesis. The patient was advised to call immediately if he has any concerning symptoms in the interval.  All questions were answered. The patient knows to call the clinic with any problems, questions or concerns. We can certainly see the patient much sooner if necessary.  I spent 15 minutes counseling the patient face to face. The total time spent in the appointment was 25 minutes.

## 2012-11-06 LAB — CULTURE, BLOOD (ROUTINE X 2): Culture: NO GROWTH

## 2012-11-07 ENCOUNTER — Telehealth: Payer: Self-pay | Admitting: Medical Oncology

## 2012-11-07 NOTE — Telephone Encounter (Signed)
Pt referred to PT to evaluate and treat (ordered by hospitalist) and requests order to treat this week and twice weekly . Per Dr Arbutus Ped it is okay for order . Called to [facility] and rx faxed for long sliding board.

## 2012-11-11 ENCOUNTER — Other Ambulatory Visit: Payer: Self-pay | Admitting: Medical Oncology

## 2012-11-11 ENCOUNTER — Other Ambulatory Visit: Payer: Medicare Other | Admitting: Lab

## 2012-11-11 DIAGNOSIS — Z87898 Personal history of other specified conditions: Secondary | ICD-10-CM

## 2012-11-11 DIAGNOSIS — C189 Malignant neoplasm of colon, unspecified: Secondary | ICD-10-CM

## 2012-11-12 ENCOUNTER — Telehealth: Payer: Self-pay | Admitting: Medical Oncology

## 2012-11-12 ENCOUNTER — Ambulatory Visit (HOSPITAL_COMMUNITY)
Admission: RE | Admit: 2012-11-12 | Discharge: 2012-11-12 | Disposition: A | Discharge status: Home / Self Care | Payer: Medicare Other | Source: Ambulatory Visit | Attending: Internal Medicine | Admitting: Internal Medicine

## 2012-11-12 ENCOUNTER — Other Ambulatory Visit (HOSPITAL_BASED_OUTPATIENT_CLINIC_OR_DEPARTMENT_OTHER): Payer: Medicare Other | Admitting: Lab

## 2012-11-12 VITALS — BP 134/67

## 2012-11-12 DIAGNOSIS — C189 Malignant neoplasm of colon, unspecified: Secondary | ICD-10-CM

## 2012-11-12 DIAGNOSIS — R188 Other ascites: Secondary | ICD-10-CM | POA: Insufficient documentation

## 2012-11-12 DIAGNOSIS — C180 Malignant neoplasm of cecum: Secondary | ICD-10-CM

## 2012-11-12 DIAGNOSIS — C787 Secondary malignant neoplasm of liver and intrahepatic bile duct: Secondary | ICD-10-CM

## 2012-11-12 LAB — COMPREHENSIVE METABOLIC PANEL (CC13)
ALT: 24 U/L (ref 0–55)
Albumin: 1.8 g/dL — ABNORMAL LOW (ref 3.5–5.0)
BUN: 26 mg/dL (ref 7.0–26.0)
CO2: 26 mEq/L (ref 22–29)
Calcium: 9.4 mg/dL (ref 8.4–10.4)
Chloride: 105 mEq/L (ref 98–107)
Creatinine: 0.7 mg/dL (ref 0.7–1.3)
Potassium: 4.5 mEq/L (ref 3.5–5.1)

## 2012-11-12 LAB — CBC WITH DIFFERENTIAL/PLATELET
BASO%: 0.3 % (ref 0.0–2.0)
Basophils Absolute: 0.1 10*3/uL (ref 0.0–0.1)
HCT: 39.2 % (ref 38.4–49.9)
HGB: 13.2 g/dL (ref 13.0–17.1)
MONO#: 2.3 10*3/uL — ABNORMAL HIGH (ref 0.1–0.9)
NEUT%: 84 % — ABNORMAL HIGH (ref 39.0–75.0)
WBC: 25 10*3/uL — ABNORMAL HIGH (ref 4.0–10.3)
lymph#: 1.5 10*3/uL (ref 0.9–3.3)

## 2012-11-12 NOTE — Telephone Encounter (Signed)
Wife stated pt had 4.5 liters of fluid drawn from his abdomen. I encouraged her to make sure he increases his fluids for the next 24 hours.

## 2012-11-12 NOTE — Procedures (Signed)
US guided therapeutic paracentesis performed yielding 4.2 liters yellow fluid. No immediate complications.

## 2012-11-13 ENCOUNTER — Telehealth: Payer: Self-pay | Admitting: Medical Oncology

## 2012-11-13 NOTE — Telephone Encounter (Signed)
Joseph Rowland is requesting palliative care to assist in pts home care. She is requesting assistance with his care , i.e bathing , activity. I called [facility] and they do not have palliative care services and he would not qualify for hospice if he is still getting chemo. I referred pt to home health nursing at [facility] to evaluate for need for aide, equipment  and other needs.

## 2012-11-14 ENCOUNTER — Telehealth: Payer: Self-pay | Admitting: Medical Oncology

## 2012-11-14 NOTE — Telephone Encounter (Signed)
Joseph Rowland said she will have a nurse go to home and evaluate nursing needs and get an aide to help pt.

## 2012-11-17 ENCOUNTER — Telehealth: Payer: Self-pay | Admitting: *Deleted

## 2012-11-17 NOTE — Telephone Encounter (Signed)
Pt's wife called stating tat pt is very weak, they feel they may need hospice but wants to know what Dr Donnald Garre thinks.  She wants to know whether he should proceed with more chemo or come to f/u on Thursday.  Informed Irving Burton that Dr Donnald Garre is out today but is back tomorrow, will call her tomorrow with his recommendations. She verbalized understanding.  SLJ

## 2012-11-18 ENCOUNTER — Other Ambulatory Visit: Payer: Self-pay | Admitting: *Deleted

## 2012-11-18 ENCOUNTER — Other Ambulatory Visit: Payer: Medicare Other | Admitting: Lab

## 2012-11-18 ENCOUNTER — Telehealth: Payer: Self-pay | Admitting: *Deleted

## 2012-11-18 ENCOUNTER — Other Ambulatory Visit: Payer: Self-pay | Admitting: Internal Medicine

## 2012-11-18 DIAGNOSIS — C719 Malignant neoplasm of brain, unspecified: Secondary | ICD-10-CM

## 2012-11-18 DIAGNOSIS — C189 Malignant neoplasm of colon, unspecified: Secondary | ICD-10-CM

## 2012-11-18 DIAGNOSIS — C787 Secondary malignant neoplasm of liver and intrahepatic bile duct: Secondary | ICD-10-CM

## 2012-11-18 MED ORDER — ALPRAZOLAM 0.25 MG PO TABS: 0.2500 mg | ORAL_TABLET | Freq: Three times a day (TID) | ORAL | Status: DC | PRN

## 2012-11-18 MED ORDER — PROCHLORPERAZINE MALEATE 10 MG PO TABS: 10.0000 mg | ORAL_TABLET | Freq: Four times a day (QID) | ORAL | Status: AC | PRN

## 2012-11-18 NOTE — Telephone Encounter (Signed)
Hospice called.  Has admitted patient today.  Requested refill on alprazolam and compazine be sent to [facility].

## 2012-11-18 NOTE — Telephone Encounter (Signed)
Per Dr Donnald Garre, he feels that hospice would be the best option for patient, Pt's wife verbalizes understanding.  Called and spoke with [facility] of [facility].  SLJ

## 2012-11-18 NOTE — Telephone Encounter (Signed)
Lonna Duval RN with Hospice called.  reports

## 2012-11-20 ENCOUNTER — Other Ambulatory Visit: Payer: Medicare Other | Admitting: Lab

## 2012-11-20 ENCOUNTER — Ambulatory Visit: Payer: Medicare Other

## 2012-11-20 ENCOUNTER — Ambulatory Visit: Payer: Medicare Other | Admitting: Internal Medicine

## 2012-11-21 ENCOUNTER — Telehealth: Payer: Self-pay | Admitting: Medical Oncology

## 2012-11-21 NOTE — Telephone Encounter (Signed)
Faxed orders

## 2012-11-27 ENCOUNTER — Other Ambulatory Visit: Payer: Medicare Other

## 2012-12-04 ENCOUNTER — Other Ambulatory Visit: Payer: Medicare Other | Admitting: Lab

## 2012-12-04 ENCOUNTER — Ambulatory Visit: Payer: Medicare Other

## 2012-12-05 ENCOUNTER — Other Ambulatory Visit: Payer: Self-pay | Admitting: *Deleted

## 2012-12-05 DIAGNOSIS — C787 Secondary malignant neoplasm of liver and intrahepatic bile duct: Secondary | ICD-10-CM

## 2012-12-05 DIAGNOSIS — C189 Malignant neoplasm of colon, unspecified: Secondary | ICD-10-CM

## 2012-12-05 DIAGNOSIS — C719 Malignant neoplasm of brain, unspecified: Secondary | ICD-10-CM

## 2012-12-05 MED ORDER — ALPRAZOLAM 0.25 MG PO TABS: 0.2500 mg | ORAL_TABLET | Freq: Three times a day (TID) | ORAL | Status: AC | PRN

## 2012-12-05 NOTE — Telephone Encounter (Signed)
e

## 2012-12-16 DEATH — deceased

## 2012-12-18 ENCOUNTER — Encounter: Payer: Self-pay | Admitting: Radiation Therapy

## 2012-12-18 NOTE — Progress Notes (Signed)
Joseph Rowland, called to change Joseph Rowland's appointment. After speaking to his wife, she was informed that Joseph Rowland expired on Jan 05, 2013.

## 2012-12-26 ENCOUNTER — Other Ambulatory Visit: Payer: Medicare Other

## 2012-12-29 ENCOUNTER — Ambulatory Visit: Payer: Medicare Other | Admitting: Radiation Oncology

## 2012-12-31 ENCOUNTER — Ambulatory Visit: Payer: Medicare Other | Admitting: Radiation Oncology

## 2013-03-23 IMAGING — CT CT CHEST W/ CM
1 of 3 series · 13 of 31 positions shown, 17 images · IV contrast (agent unspecified)
Comparison: 02/28/2012

CT CHEST

CLINICAL DATA: Metastatic colon carcinoma diagnosed [DATE] with
chemotherapy ongoing.

CT CHEST, ABDOMEN AND PELVIS WITH CONTRAST
TECHNIQUE: Contiguous axial images of the chest abdomen and pelvis
were obtained after IV contrast administration.
Contrast: 100  ml Emnipaque-ENN

[Series 2: cap with st · axial · 0.78mm/px · z∈[-639,-49]mm · 13 of 138 slices shown, 17 images]
[im 10/138  mediastinal]
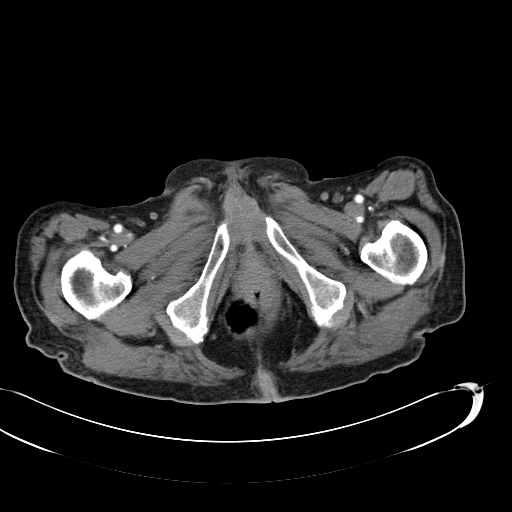
[im 10/138  lung]
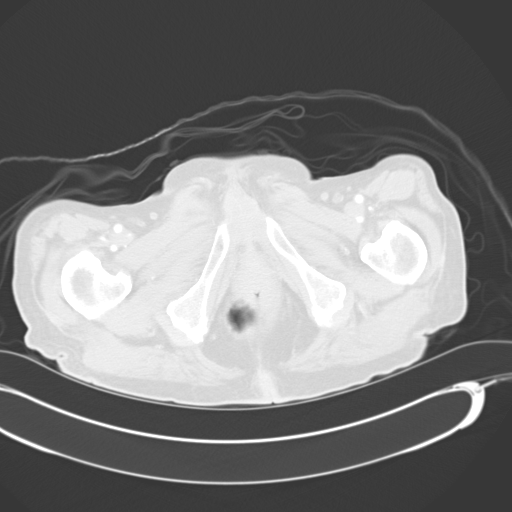
[im 19/138  lung]
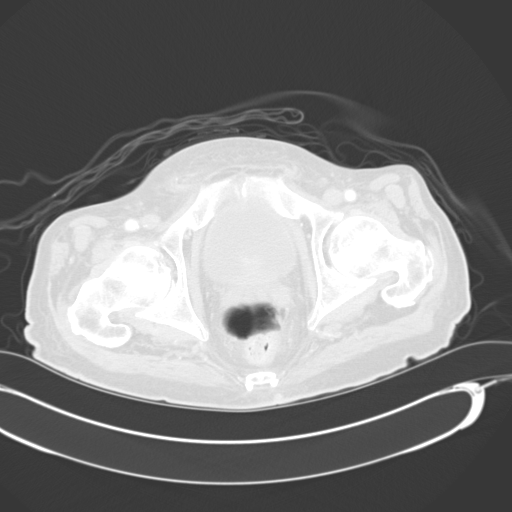
[im 37/138  lung]
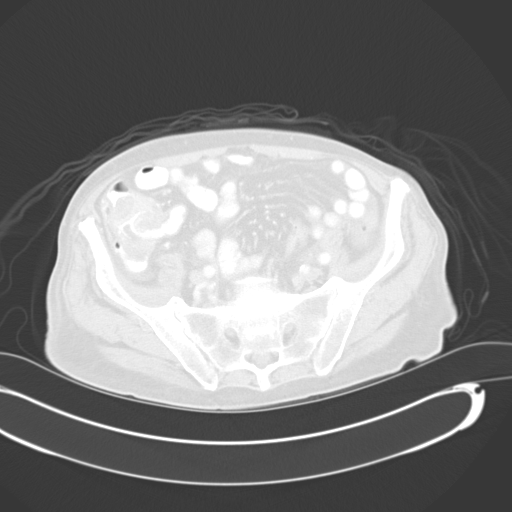
[im 46/138  lung]
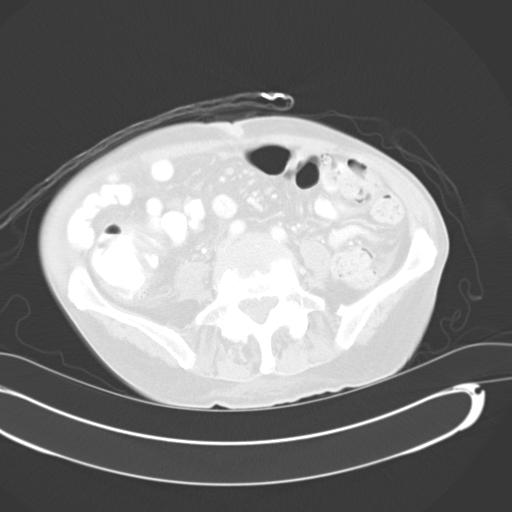
[im 55/138  mediastinal]
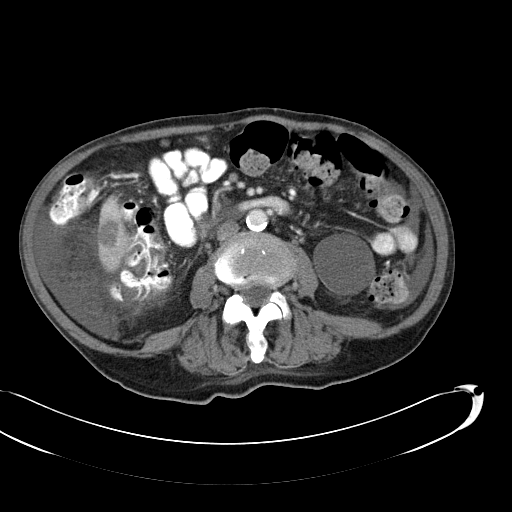
[im 55/138  lung]
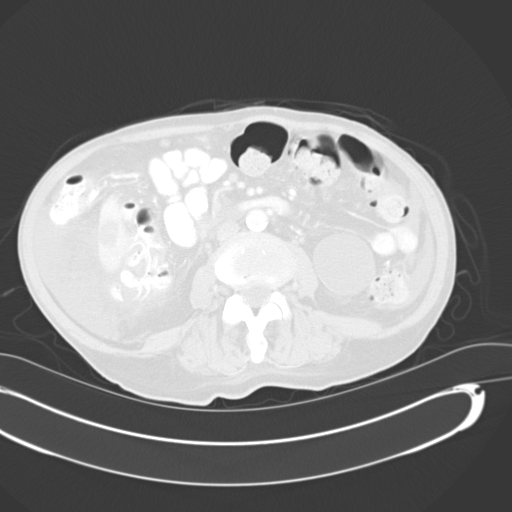
[im 64/138  lung]
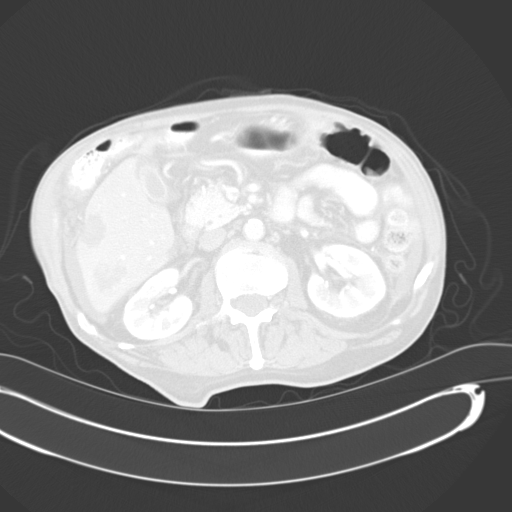
[im 69/138  lung]
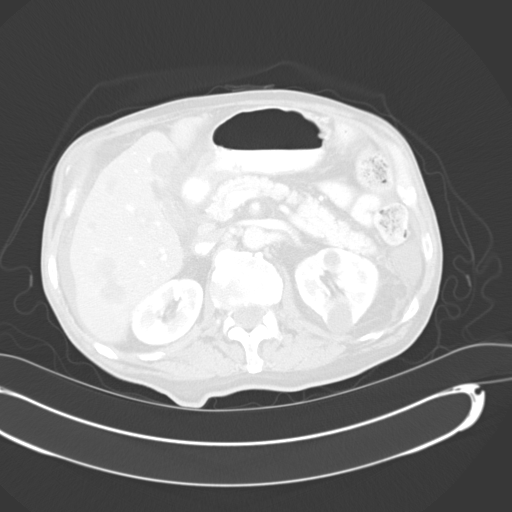
[im 74/138  lung]
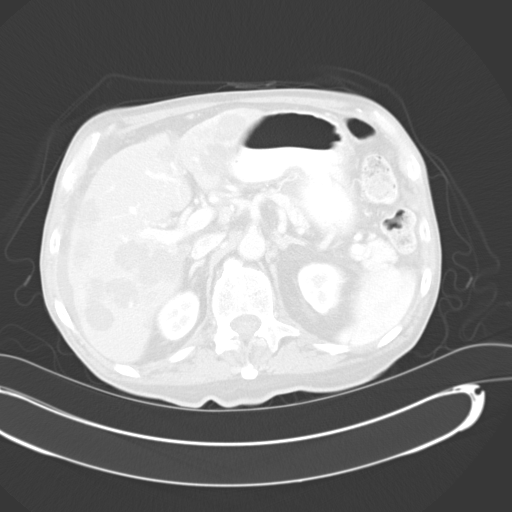
[im 83/138  mediastinal]
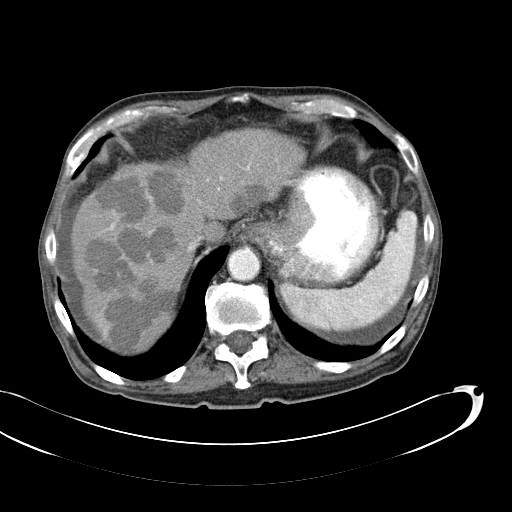
[im 83/138  lung]
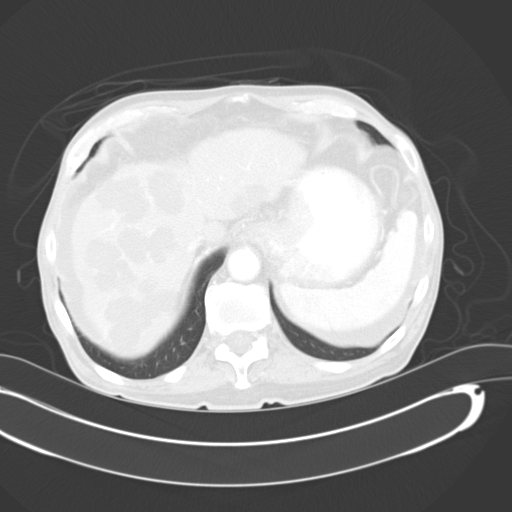
[im 92/138  lung]
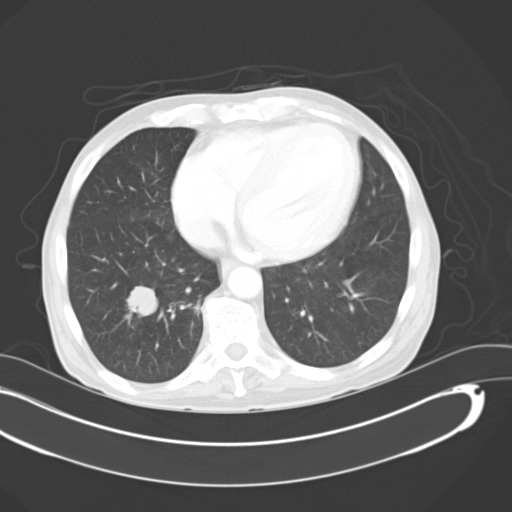
[im 101/138  lung]
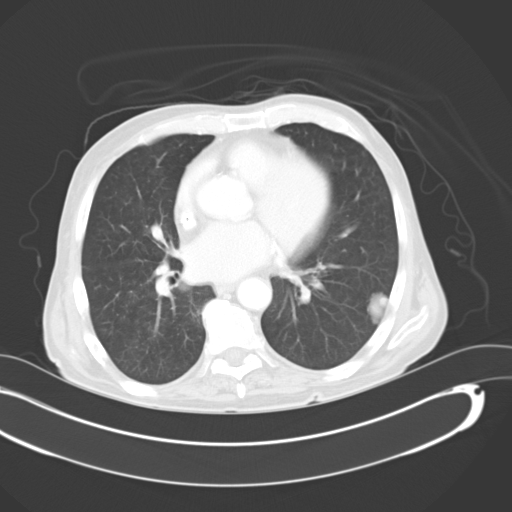
[im 119/138  lung]
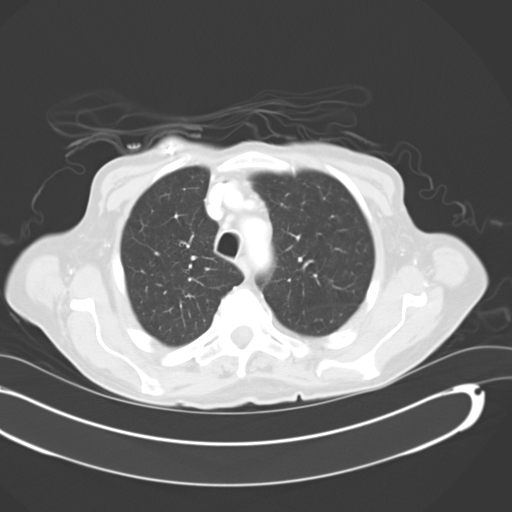
[im 128/138  mediastinal]
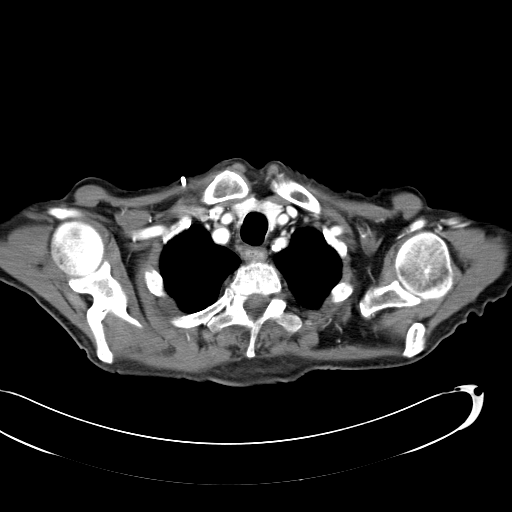
[im 128/138  lung]
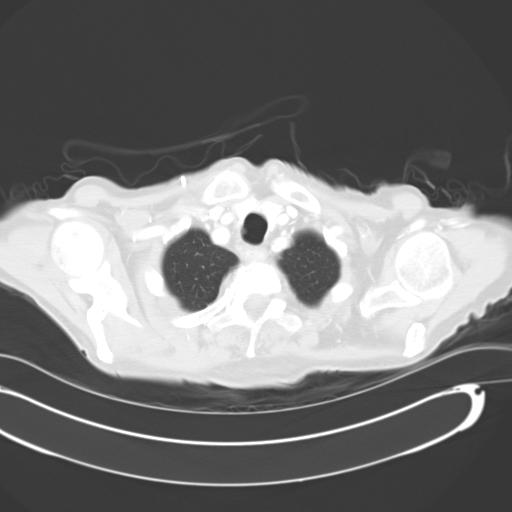

[13 of 31 positions shown; findings below may reference images not displayed]

FINDINGS: Lung windows demonstrate probable secretions along the
right side trachea.  Bilateral pulmonary metastasis as follows:
- Right lower lobe nodule which measures 2.5 x 2.3 cm on image
48/series 5 compared with 2.7 x 2.4 cm on the prior.
- More medial right lower lobe 1.0 cm nodule on image 44/series 5
versus 1.1 cm on the prior.
- 1.4 cm left upper lobe nodule on image 30/series 5 versus 1.3 cm
on the prior.
- Pleural-based left lower lobe nodule which measures 2.2 x 1.4 cm
on image 37/series 5 versus 2.1 x 1.4 cm on the prior exam.
- 5 mm left lower lobe nodule on image 36/series 5 is similar to
slightly less conspicuous than on the prior.

Other scattered nodules which are felt to be similar..

Soft tissue windows demonstrate no supraclavicular adenopathy.
Right-sided Port-A-Cath which terminates in the right atrium on
supine positioning.  Moderate atherosclerotic narrowing of the
proximal left subclavian artery on image 13/series 2.

Mild cardiomegaly.  Multivessel coronary artery atherosclerosis.
The possible left apical thrombus described on the prior exam is
not readily apparent today.  There is mild lipomatous hypertrophy
of the interatrial septum.  No pericardial effusion.  Resolved
small right pleural effusion. No central pulmonary embolism, on
this non-dedicated study.  High right paratracheal node measures 6
mm today versus 9 mm on the prior. No mediastinal or hilar
adenopathy.
IMPRESSION: 1.  Similar pulmonary metastatic burden.
2.  No new or progressive disease within the chest.
3.  Resolution of small right pleural effusion.

CT ABDOMEN AND PELVIS
FINDINGS: Extensive hepatic metastasis.
-  Index right hepatic lobe lesion measures 4.5 x 3.4 cm on image
73/series 2 compare 4.6 x 3.5 cm on the prior.
- Index high medial segment left liver lobe lesion measures 2.3 x
3.5 cm on image 56/series 2 versus 3.6 x 2.3 cm on the prior.

No enlarging lesions.  Normal spleen, stomach, pancreas.  There is
probable gallbladder sludge or stones, including on image 77/series
2.  The gallbladder mucosa is mildly hyperenhancing; new finding.
There is minimal pericholecystic fluid, nonspecific in the setting
of small volume ascites.

No biliary ductal dilatation.  Normal adrenal glands.  Normal right
kidney.  Scattered left renal cysts.  An aortocaval node measures 8
mm on image 73/series 2 and is unchanged.  Not pathologic by size
criteria.

Colonic stool burden suggests constipation.

Apparent ascending colonic wall thickening which is favored to be
due to underdistension.  Cecal primary again seen on image
102/series 2.  No obstruction.

Normal small bowel caliber.  Redemonstration of omental/peritoneal
disease.  Example within the perihepatic space on image 77/series
2.  Adenopathy in the ileocolic mesentery at 1.2 cm on image
98/series 2.  Similar when remeasured. No pneumatosis or free
intraperitoneal air.

No pelvic sidewall adenopathy.  Normal urinary bladder and
prostate.  Moderate volume cul-de-sac fluid which is increased.
Lytic lesion within the L5 vertebral body which appears similar.
Mild superior endplate compression deformity.
IMPRESSION: 1.  Similar hepatic metastasis.
2.  Omental/peritoneal metastasis with increased abdominal pelvic
ascites.
3.  Cecal primary with adjacent localized nodal metastasis.  No
obstruction.
4.  Gallstones or sludge with pericholecystic fluid and mucosal
hyperenhancement.  Nonspecific in the setting of ascites.  If there
are right upper quadrant symptoms, recommend ultrasound.
5.  L5 osseous metastasis with mild pathologic compression
deformity, grossly similar.

## 2013-09-01 IMAGING — US US PARACENTESIS
1 series · 5 of 5 positions shown · non-contrast
Comparison: none

CLINICAL DATA: Metastatic colon carcinoma, recurrent ascites.
Request is made for therapeutic paracentesis.

[Series 1: us paracentesis · 0.32mm/px · 5 of 5 slices shown]
[im 1/5]
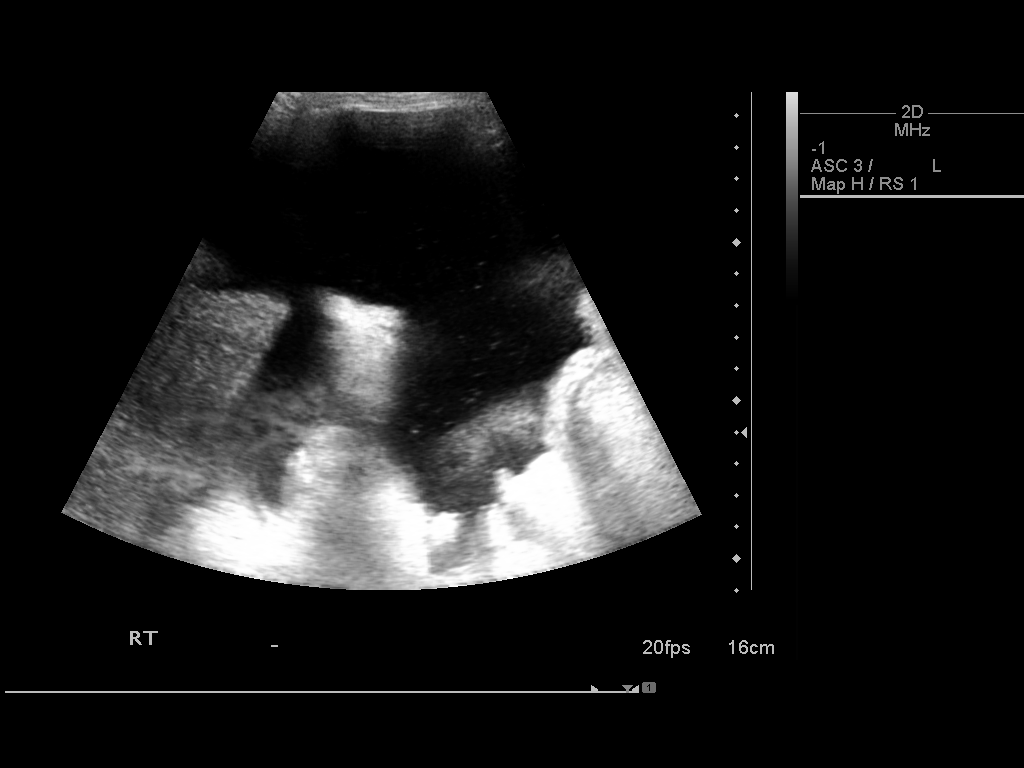
[im 2/5]
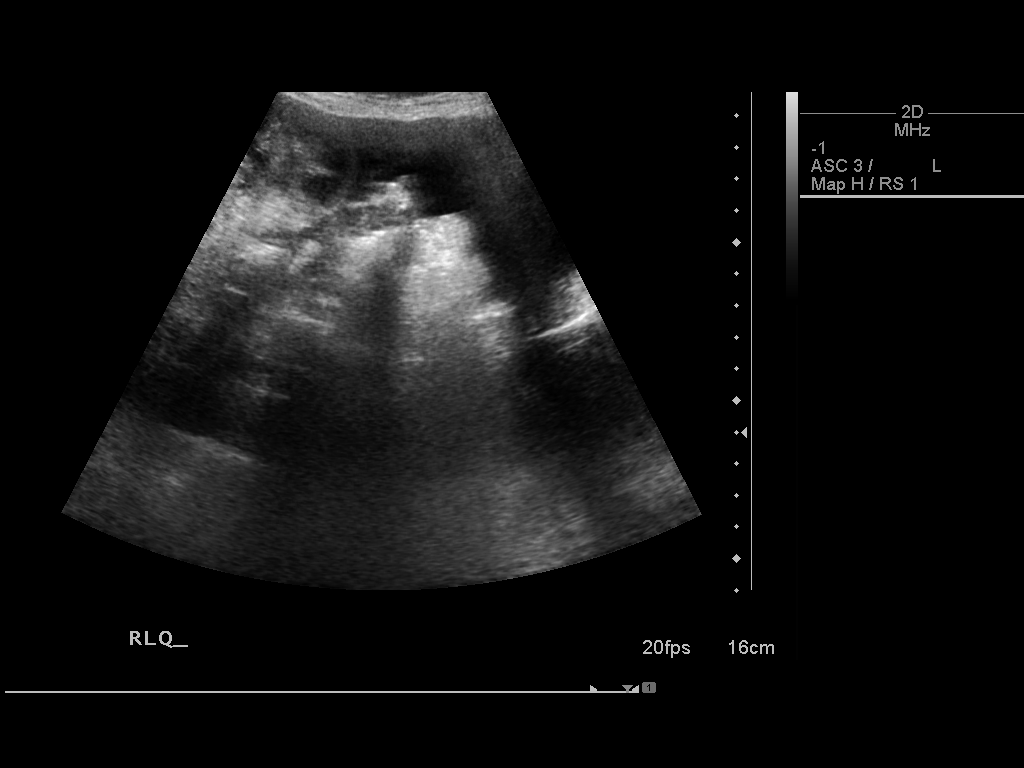
[im 3/5]
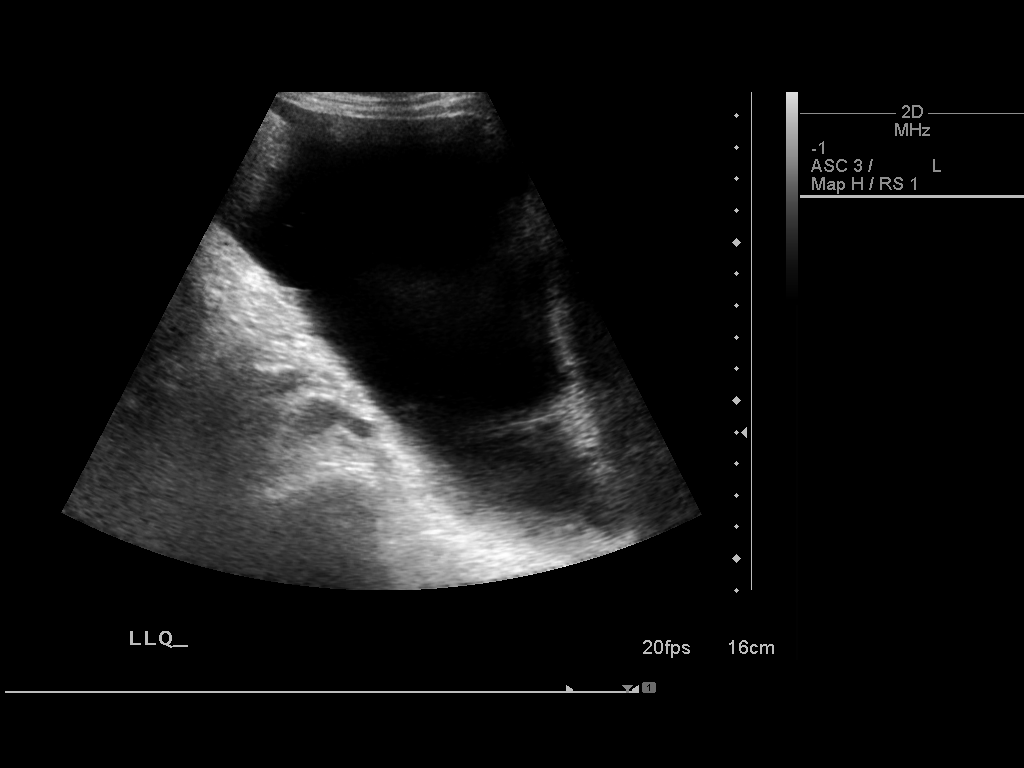
[im 4/5]
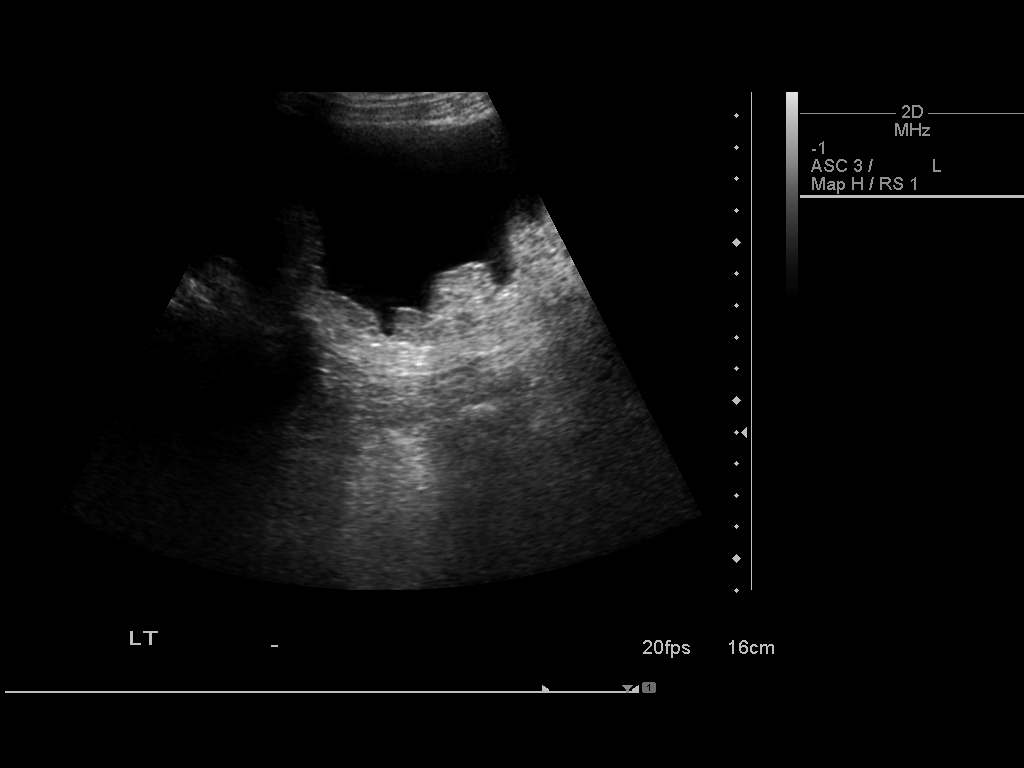
[im 5/5]
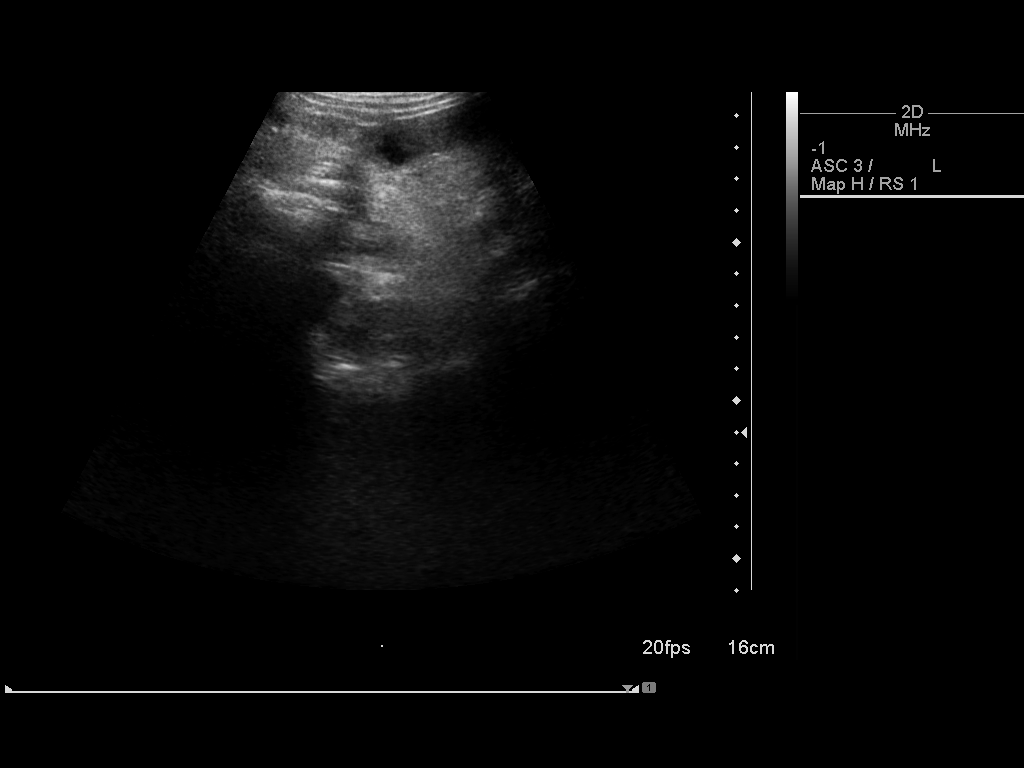

[5 of 5 positions shown; findings below may reference images not displayed]

ULTRASOUND GUIDED THERAPEUTIC  PARACENTESIS

An ultrasound guided paracentesis was thoroughly discussed with the
patient/patient's family and questions answered.  The benefits,
risks, alternatives and complications were also discussed.  The
patient/patient's family understands and wishes to proceed with the
procedure.  Written consent was obtained.

Ultrasound was performed to localize and mark an adequate pocket of
fluid in the right lower quadrant of the abdomen.  The area was
then prepped and draped in the normal sterile fashion.  1%
Lidocaine was used for local anesthesia.  Under ultrasound guidance
a 19 gauge Yueh catheter was introduced.  Paracentesis was
performed.  The catheter was removed and a dressing applied.

Complications:  none
FINDINGS: A total of approximately 4.2 liters of yellow fluid was
removed.
IMPRESSION: Successful ultrasound guided therapeutic paracentesis yielding
liters of ascites.

## 2014-02-10 ENCOUNTER — Other Ambulatory Visit: Payer: Self-pay | Admitting: Pharmacist

## 2014-02-11 NOTE — Telephone Encounter (Signed)
Encounter was telephone call. 

## 2014-07-01 ENCOUNTER — Encounter (HOSPITAL_COMMUNITY): Payer: Self-pay | Admitting: Gastroenterology
